# Patient Record
Sex: Female | Born: 1935 | Race: White | Hispanic: No | Marital: Single | Smoking: Former smoker
Health system: Southern US, Community
[De-identification: ages and names within clinical notes are randomized; demographics above are authoritative.]

## PROBLEM LIST (undated history)

## (undated) DIAGNOSIS — I10 Essential (primary) hypertension: Secondary | ICD-10-CM

## (undated) DIAGNOSIS — E785 Hyperlipidemia, unspecified: Secondary | ICD-10-CM

## (undated) DIAGNOSIS — I5189 Other ill-defined heart diseases: Secondary | ICD-10-CM

## (undated) DIAGNOSIS — D631 Anemia in chronic kidney disease: Secondary | ICD-10-CM

## (undated) DIAGNOSIS — J449 Chronic obstructive pulmonary disease, unspecified: Secondary | ICD-10-CM

## (undated) DIAGNOSIS — I351 Nonrheumatic aortic (valve) insufficiency: Secondary | ICD-10-CM

## (undated) DIAGNOSIS — I350 Nonrheumatic aortic (valve) stenosis: Secondary | ICD-10-CM

## (undated) DIAGNOSIS — E039 Hypothyroidism, unspecified: Secondary | ICD-10-CM

## (undated) DIAGNOSIS — N189 Chronic kidney disease, unspecified: Secondary | ICD-10-CM

## (undated) DIAGNOSIS — N184 Chronic kidney disease, stage 4 (severe): Secondary | ICD-10-CM

## (undated) DIAGNOSIS — F068 Other specified mental disorders due to known physiological condition: Secondary | ICD-10-CM

## (undated) HISTORY — PX: BREAST REDUCTION SURGERY: SHX8

## (undated) HISTORY — PX: CHOLECYSTECTOMY: SHX55

---

## 2018-03-03 DIAGNOSIS — I10 Essential (primary) hypertension: Secondary | ICD-10-CM | POA: Diagnosis not present

## 2018-03-03 DIAGNOSIS — E038 Other specified hypothyroidism: Secondary | ICD-10-CM | POA: Diagnosis not present

## 2018-04-17 DIAGNOSIS — Z79899 Other long term (current) drug therapy: Secondary | ICD-10-CM | POA: Diagnosis not present

## 2018-04-17 DIAGNOSIS — E038 Other specified hypothyroidism: Secondary | ICD-10-CM | POA: Diagnosis not present

## 2018-04-17 DIAGNOSIS — E782 Mixed hyperlipidemia: Secondary | ICD-10-CM | POA: Diagnosis not present

## 2018-04-24 DIAGNOSIS — R69 Illness, unspecified: Secondary | ICD-10-CM | POA: Diagnosis not present

## 2018-04-24 DIAGNOSIS — I10 Essential (primary) hypertension: Secondary | ICD-10-CM | POA: Diagnosis not present

## 2018-04-24 DIAGNOSIS — N184 Chronic kidney disease, stage 4 (severe): Secondary | ICD-10-CM | POA: Diagnosis not present

## 2018-04-24 DIAGNOSIS — G301 Alzheimer's disease with late onset: Secondary | ICD-10-CM | POA: Diagnosis not present

## 2018-04-24 DIAGNOSIS — E538 Deficiency of other specified B group vitamins: Secondary | ICD-10-CM | POA: Diagnosis not present

## 2018-04-24 DIAGNOSIS — E782 Mixed hyperlipidemia: Secondary | ICD-10-CM | POA: Diagnosis not present

## 2018-04-24 DIAGNOSIS — E038 Other specified hypothyroidism: Secondary | ICD-10-CM | POA: Diagnosis not present

## 2018-04-24 DIAGNOSIS — F3341 Major depressive disorder, recurrent, in partial remission: Secondary | ICD-10-CM | POA: Diagnosis not present

## 2018-04-24 DIAGNOSIS — M3509 Sicca syndrome with other organ involvement: Secondary | ICD-10-CM | POA: Diagnosis not present

## 2018-04-24 DIAGNOSIS — F0280 Dementia in other diseases classified elsewhere without behavioral disturbance: Secondary | ICD-10-CM | POA: Diagnosis not present

## 2018-07-25 DIAGNOSIS — E038 Other specified hypothyroidism: Secondary | ICD-10-CM | POA: Diagnosis not present

## 2018-10-06 DIAGNOSIS — R69 Illness, unspecified: Secondary | ICD-10-CM | POA: Diagnosis not present

## 2018-10-18 DIAGNOSIS — E038 Other specified hypothyroidism: Secondary | ICD-10-CM | POA: Diagnosis not present

## 2018-10-18 DIAGNOSIS — E782 Mixed hyperlipidemia: Secondary | ICD-10-CM | POA: Diagnosis not present

## 2018-10-18 DIAGNOSIS — E538 Deficiency of other specified B group vitamins: Secondary | ICD-10-CM | POA: Diagnosis not present

## 2018-10-18 DIAGNOSIS — I10 Essential (primary) hypertension: Secondary | ICD-10-CM | POA: Diagnosis not present

## 2018-10-23 DIAGNOSIS — G301 Alzheimer's disease with late onset: Secondary | ICD-10-CM | POA: Diagnosis not present

## 2018-10-23 DIAGNOSIS — E782 Mixed hyperlipidemia: Secondary | ICD-10-CM | POA: Diagnosis not present

## 2018-10-23 DIAGNOSIS — N184 Chronic kidney disease, stage 4 (severe): Secondary | ICD-10-CM | POA: Diagnosis not present

## 2018-10-23 DIAGNOSIS — F0280 Dementia in other diseases classified elsewhere without behavioral disturbance: Secondary | ICD-10-CM | POA: Diagnosis not present

## 2018-10-23 DIAGNOSIS — R69 Illness, unspecified: Secondary | ICD-10-CM | POA: Diagnosis not present

## 2018-10-23 DIAGNOSIS — I10 Essential (primary) hypertension: Secondary | ICD-10-CM | POA: Diagnosis not present

## 2018-10-23 DIAGNOSIS — Z0000 Encounter for general adult medical examination without abnormal findings: Secondary | ICD-10-CM | POA: Diagnosis not present

## 2018-10-23 DIAGNOSIS — F3341 Major depressive disorder, recurrent, in partial remission: Secondary | ICD-10-CM | POA: Diagnosis not present

## 2018-11-20 DIAGNOSIS — E039 Hypothyroidism, unspecified: Secondary | ICD-10-CM | POA: Diagnosis not present

## 2018-11-20 DIAGNOSIS — R0902 Hypoxemia: Secondary | ICD-10-CM | POA: Diagnosis not present

## 2018-11-20 DIAGNOSIS — R9431 Abnormal electrocardiogram [ECG] [EKG]: Secondary | ICD-10-CM | POA: Diagnosis not present

## 2018-11-20 DIAGNOSIS — Z7982 Long term (current) use of aspirin: Secondary | ICD-10-CM | POA: Diagnosis not present

## 2018-11-20 DIAGNOSIS — R69 Illness, unspecified: Secondary | ICD-10-CM | POA: Diagnosis not present

## 2018-11-20 DIAGNOSIS — R0789 Other chest pain: Secondary | ICD-10-CM | POA: Diagnosis not present

## 2018-11-20 DIAGNOSIS — G4700 Insomnia, unspecified: Secondary | ICD-10-CM | POA: Diagnosis not present

## 2018-11-20 DIAGNOSIS — J181 Lobar pneumonia, unspecified organism: Secondary | ICD-10-CM | POA: Diagnosis not present

## 2018-11-20 DIAGNOSIS — J449 Chronic obstructive pulmonary disease, unspecified: Secondary | ICD-10-CM | POA: Diagnosis not present

## 2018-11-20 DIAGNOSIS — R918 Other nonspecific abnormal finding of lung field: Secondary | ICD-10-CM | POA: Diagnosis not present

## 2018-11-20 DIAGNOSIS — J159 Unspecified bacterial pneumonia: Secondary | ICD-10-CM | POA: Diagnosis not present

## 2018-11-20 DIAGNOSIS — K219 Gastro-esophageal reflux disease without esophagitis: Secondary | ICD-10-CM | POA: Diagnosis not present

## 2018-11-20 DIAGNOSIS — R0689 Other abnormalities of breathing: Secondary | ICD-10-CM | POA: Diagnosis not present

## 2018-11-20 DIAGNOSIS — N184 Chronic kidney disease, stage 4 (severe): Secondary | ICD-10-CM | POA: Diagnosis not present

## 2018-11-20 DIAGNOSIS — D638 Anemia in other chronic diseases classified elsewhere: Secondary | ICD-10-CM | POA: Diagnosis not present

## 2018-11-20 DIAGNOSIS — M3500 Sicca syndrome, unspecified: Secondary | ICD-10-CM | POA: Diagnosis not present

## 2018-11-20 DIAGNOSIS — R072 Precordial pain: Secondary | ICD-10-CM | POA: Diagnosis not present

## 2018-11-20 DIAGNOSIS — I2510 Atherosclerotic heart disease of native coronary artery without angina pectoris: Secondary | ICD-10-CM | POA: Diagnosis not present

## 2018-11-20 DIAGNOSIS — I440 Atrioventricular block, first degree: Secondary | ICD-10-CM | POA: Diagnosis not present

## 2018-11-20 DIAGNOSIS — G301 Alzheimer's disease with late onset: Secondary | ICD-10-CM | POA: Diagnosis not present

## 2018-11-20 DIAGNOSIS — R52 Pain, unspecified: Secondary | ICD-10-CM | POA: Diagnosis not present

## 2018-11-20 DIAGNOSIS — R079 Chest pain, unspecified: Secondary | ICD-10-CM | POA: Diagnosis not present

## 2018-11-20 DIAGNOSIS — I129 Hypertensive chronic kidney disease with stage 1 through stage 4 chronic kidney disease, or unspecified chronic kidney disease: Secondary | ICD-10-CM | POA: Diagnosis not present

## 2018-11-20 DIAGNOSIS — J440 Chronic obstructive pulmonary disease with acute lower respiratory infection: Secondary | ICD-10-CM | POA: Diagnosis not present

## 2018-11-21 DIAGNOSIS — J159 Unspecified bacterial pneumonia: Secondary | ICD-10-CM | POA: Diagnosis not present

## 2018-11-21 DIAGNOSIS — N184 Chronic kidney disease, stage 4 (severe): Secondary | ICD-10-CM | POA: Diagnosis not present

## 2018-11-21 DIAGNOSIS — J449 Chronic obstructive pulmonary disease, unspecified: Secondary | ICD-10-CM | POA: Diagnosis not present

## 2018-11-21 DIAGNOSIS — R69 Illness, unspecified: Secondary | ICD-10-CM | POA: Diagnosis not present

## 2018-11-21 DIAGNOSIS — I2510 Atherosclerotic heart disease of native coronary artery without angina pectoris: Secondary | ICD-10-CM | POA: Diagnosis not present

## 2018-11-21 DIAGNOSIS — Z8673 Personal history of transient ischemic attack (TIA), and cerebral infarction without residual deficits: Secondary | ICD-10-CM | POA: Diagnosis not present

## 2018-11-21 DIAGNOSIS — E039 Hypothyroidism, unspecified: Secondary | ICD-10-CM | POA: Diagnosis not present

## 2018-11-23 DIAGNOSIS — E039 Hypothyroidism, unspecified: Secondary | ICD-10-CM | POA: Diagnosis not present

## 2018-11-23 DIAGNOSIS — I2510 Atherosclerotic heart disease of native coronary artery without angina pectoris: Secondary | ICD-10-CM | POA: Diagnosis not present

## 2018-11-23 DIAGNOSIS — R69 Illness, unspecified: Secondary | ICD-10-CM | POA: Diagnosis not present

## 2018-11-23 DIAGNOSIS — I129 Hypertensive chronic kidney disease with stage 1 through stage 4 chronic kidney disease, or unspecified chronic kidney disease: Secondary | ICD-10-CM | POA: Diagnosis not present

## 2018-11-23 DIAGNOSIS — J440 Chronic obstructive pulmonary disease with acute lower respiratory infection: Secondary | ICD-10-CM | POA: Diagnosis not present

## 2018-11-23 DIAGNOSIS — Z8673 Personal history of transient ischemic attack (TIA), and cerebral infarction without residual deficits: Secondary | ICD-10-CM | POA: Diagnosis not present

## 2018-11-23 DIAGNOSIS — J189 Pneumonia, unspecified organism: Secondary | ICD-10-CM | POA: Diagnosis not present

## 2018-11-23 DIAGNOSIS — E785 Hyperlipidemia, unspecified: Secondary | ICD-10-CM | POA: Diagnosis not present

## 2018-11-23 DIAGNOSIS — K219 Gastro-esophageal reflux disease without esophagitis: Secondary | ICD-10-CM | POA: Diagnosis not present

## 2018-11-23 DIAGNOSIS — N184 Chronic kidney disease, stage 4 (severe): Secondary | ICD-10-CM | POA: Diagnosis not present

## 2018-11-27 DIAGNOSIS — R69 Illness, unspecified: Secondary | ICD-10-CM | POA: Diagnosis not present

## 2018-12-05 DIAGNOSIS — I10 Essential (primary) hypertension: Secondary | ICD-10-CM | POA: Diagnosis not present

## 2018-12-05 DIAGNOSIS — R42 Dizziness and giddiness: Secondary | ICD-10-CM | POA: Diagnosis not present

## 2018-12-05 DIAGNOSIS — R918 Other nonspecific abnormal finding of lung field: Secondary | ICD-10-CM | POA: Diagnosis not present

## 2018-12-05 DIAGNOSIS — Z09 Encounter for follow-up examination after completed treatment for conditions other than malignant neoplasm: Secondary | ICD-10-CM | POA: Diagnosis not present

## 2018-12-05 DIAGNOSIS — J181 Lobar pneumonia, unspecified organism: Secondary | ICD-10-CM | POA: Diagnosis not present

## 2018-12-05 DIAGNOSIS — Z955 Presence of coronary angioplasty implant and graft: Secondary | ICD-10-CM | POA: Diagnosis not present

## 2019-01-04 DIAGNOSIS — R69 Illness, unspecified: Secondary | ICD-10-CM | POA: Diagnosis not present

## 2019-01-11 DIAGNOSIS — R918 Other nonspecific abnormal finding of lung field: Secondary | ICD-10-CM | POA: Diagnosis not present

## 2019-01-11 DIAGNOSIS — J181 Lobar pneumonia, unspecified organism: Secondary | ICD-10-CM | POA: Diagnosis not present

## 2019-01-11 DIAGNOSIS — K449 Diaphragmatic hernia without obstruction or gangrene: Secondary | ICD-10-CM | POA: Diagnosis not present

## 2019-02-15 DIAGNOSIS — I10 Essential (primary) hypertension: Secondary | ICD-10-CM | POA: Diagnosis not present

## 2019-02-15 DIAGNOSIS — E875 Hyperkalemia: Secondary | ICD-10-CM | POA: Diagnosis not present

## 2019-02-15 DIAGNOSIS — J101 Influenza due to other identified influenza virus with other respiratory manifestations: Secondary | ICD-10-CM | POA: Diagnosis not present

## 2019-02-22 DIAGNOSIS — J181 Lobar pneumonia, unspecified organism: Secondary | ICD-10-CM | POA: Diagnosis not present

## 2019-02-22 DIAGNOSIS — E782 Mixed hyperlipidemia: Secondary | ICD-10-CM | POA: Diagnosis not present

## 2019-02-22 DIAGNOSIS — I10 Essential (primary) hypertension: Secondary | ICD-10-CM | POA: Diagnosis not present

## 2019-02-22 DIAGNOSIS — R918 Other nonspecific abnormal finding of lung field: Secondary | ICD-10-CM | POA: Diagnosis not present

## 2019-02-22 DIAGNOSIS — R69 Illness, unspecified: Secondary | ICD-10-CM | POA: Diagnosis not present

## 2019-02-22 DIAGNOSIS — G301 Alzheimer's disease with late onset: Secondary | ICD-10-CM | POA: Diagnosis not present

## 2019-02-22 DIAGNOSIS — N184 Chronic kidney disease, stage 4 (severe): Secondary | ICD-10-CM | POA: Diagnosis not present

## 2019-02-22 DIAGNOSIS — E538 Deficiency of other specified B group vitamins: Secondary | ICD-10-CM | POA: Diagnosis not present

## 2019-02-22 DIAGNOSIS — F0280 Dementia in other diseases classified elsewhere without behavioral disturbance: Secondary | ICD-10-CM | POA: Diagnosis not present

## 2019-02-28 DIAGNOSIS — L821 Other seborrheic keratosis: Secondary | ICD-10-CM | POA: Diagnosis not present

## 2019-02-28 DIAGNOSIS — M40205 Unspecified kyphosis, thoracolumbar region: Secondary | ICD-10-CM | POA: Diagnosis not present

## 2019-02-28 DIAGNOSIS — Z85821 Personal history of Merkel cell carcinoma: Secondary | ICD-10-CM | POA: Diagnosis not present

## 2019-02-28 DIAGNOSIS — Z85828 Personal history of other malignant neoplasm of skin: Secondary | ICD-10-CM | POA: Diagnosis not present

## 2019-04-07 DIAGNOSIS — W19XXXA Unspecified fall, initial encounter: Secondary | ICD-10-CM | POA: Diagnosis not present

## 2019-04-07 DIAGNOSIS — Y998 Other external cause status: Secondary | ICD-10-CM | POA: Diagnosis not present

## 2019-04-07 DIAGNOSIS — M542 Cervicalgia: Secondary | ICD-10-CM | POA: Diagnosis not present

## 2019-04-07 DIAGNOSIS — S0990XA Unspecified injury of head, initial encounter: Secondary | ICD-10-CM | POA: Diagnosis not present

## 2019-04-07 DIAGNOSIS — G44309 Post-traumatic headache, unspecified, not intractable: Secondary | ICD-10-CM | POA: Diagnosis not present

## 2019-04-07 DIAGNOSIS — S0001XA Abrasion of scalp, initial encounter: Secondary | ICD-10-CM | POA: Diagnosis not present

## 2019-04-07 DIAGNOSIS — M25562 Pain in left knee: Secondary | ICD-10-CM | POA: Diagnosis not present

## 2019-04-07 DIAGNOSIS — W01198A Fall on same level from slipping, tripping and stumbling with subsequent striking against other object, initial encounter: Secondary | ICD-10-CM | POA: Diagnosis not present

## 2019-06-27 DIAGNOSIS — R109 Unspecified abdominal pain: Secondary | ICD-10-CM | POA: Diagnosis not present

## 2019-06-27 DIAGNOSIS — K5901 Slow transit constipation: Secondary | ICD-10-CM | POA: Diagnosis not present

## 2019-06-27 DIAGNOSIS — I10 Essential (primary) hypertension: Secondary | ICD-10-CM | POA: Diagnosis not present

## 2019-08-09 DIAGNOSIS — N184 Chronic kidney disease, stage 4 (severe): Secondary | ICD-10-CM | POA: Diagnosis not present

## 2019-08-09 DIAGNOSIS — I129 Hypertensive chronic kidney disease with stage 1 through stage 4 chronic kidney disease, or unspecified chronic kidney disease: Secondary | ICD-10-CM | POA: Diagnosis not present

## 2019-08-09 DIAGNOSIS — E039 Hypothyroidism, unspecified: Secondary | ICD-10-CM | POA: Diagnosis not present

## 2019-08-13 DIAGNOSIS — E039 Hypothyroidism, unspecified: Secondary | ICD-10-CM | POA: Diagnosis not present

## 2019-08-13 DIAGNOSIS — Z79899 Other long term (current) drug therapy: Secondary | ICD-10-CM | POA: Diagnosis not present

## 2019-08-13 DIAGNOSIS — D649 Anemia, unspecified: Secondary | ICD-10-CM | POA: Diagnosis not present

## 2019-08-20 ENCOUNTER — Other Ambulatory Visit: Payer: Self-pay

## 2019-08-20 ENCOUNTER — Inpatient Hospital Stay (HOSPITAL_COMMUNITY)
Admission: EM | Admit: 2019-08-20 | Discharge: 2019-08-24 | DRG: 871 | Disposition: A | Discharge status: Home Health | Payer: Medicare HMO | Attending: Internal Medicine | Admitting: Internal Medicine

## 2019-08-20 ENCOUNTER — Emergency Department (HOSPITAL_COMMUNITY): Payer: Medicare HMO

## 2019-08-20 ENCOUNTER — Encounter (HOSPITAL_COMMUNITY): Payer: Self-pay

## 2019-08-20 DIAGNOSIS — D539 Nutritional anemia, unspecified: Secondary | ICD-10-CM

## 2019-08-20 DIAGNOSIS — E782 Mixed hyperlipidemia: Secondary | ICD-10-CM | POA: Diagnosis present

## 2019-08-20 DIAGNOSIS — F0390 Unspecified dementia without behavioral disturbance: Secondary | ICD-10-CM | POA: Diagnosis present

## 2019-08-20 DIAGNOSIS — D520 Dietary folate deficiency anemia: Secondary | ICD-10-CM | POA: Diagnosis present

## 2019-08-20 DIAGNOSIS — J9621 Acute and chronic respiratory failure with hypoxia: Secondary | ICD-10-CM | POA: Diagnosis not present

## 2019-08-20 DIAGNOSIS — J9601 Acute respiratory failure with hypoxia: Secondary | ICD-10-CM | POA: Diagnosis not present

## 2019-08-20 DIAGNOSIS — N184 Chronic kidney disease, stage 4 (severe): Secondary | ICD-10-CM | POA: Diagnosis not present

## 2019-08-20 DIAGNOSIS — J441 Chronic obstructive pulmonary disease with (acute) exacerbation: Secondary | ICD-10-CM | POA: Diagnosis not present

## 2019-08-20 DIAGNOSIS — R112 Nausea with vomiting, unspecified: Secondary | ICD-10-CM | POA: Diagnosis not present

## 2019-08-20 DIAGNOSIS — G939 Disorder of brain, unspecified: Secondary | ICD-10-CM | POA: Diagnosis present

## 2019-08-20 DIAGNOSIS — E8770 Fluid overload, unspecified: Secondary | ICD-10-CM

## 2019-08-20 DIAGNOSIS — Z85821 Personal history of Merkel cell carcinoma: Secondary | ICD-10-CM

## 2019-08-20 DIAGNOSIS — I10 Essential (primary) hypertension: Secondary | ICD-10-CM | POA: Diagnosis present

## 2019-08-20 DIAGNOSIS — Z20828 Contact with and (suspected) exposure to other viral communicable diseases: Secondary | ICD-10-CM | POA: Diagnosis present

## 2019-08-20 DIAGNOSIS — R9431 Abnormal electrocardiogram [ECG] [EKG]: Secondary | ICD-10-CM | POA: Diagnosis present

## 2019-08-20 DIAGNOSIS — D631 Anemia in chronic kidney disease: Secondary | ICD-10-CM | POA: Diagnosis present

## 2019-08-20 DIAGNOSIS — R05 Cough: Secondary | ICD-10-CM | POA: Diagnosis not present

## 2019-08-20 DIAGNOSIS — E039 Hypothyroidism, unspecified: Secondary | ICD-10-CM | POA: Diagnosis present

## 2019-08-20 DIAGNOSIS — F068 Other specified mental disorders due to known physiological condition: Secondary | ICD-10-CM | POA: Diagnosis not present

## 2019-08-20 DIAGNOSIS — J189 Pneumonia, unspecified organism: Secondary | ICD-10-CM | POA: Diagnosis present

## 2019-08-20 DIAGNOSIS — Z823 Family history of stroke: Secondary | ICD-10-CM | POA: Diagnosis not present

## 2019-08-20 DIAGNOSIS — I129 Hypertensive chronic kidney disease with stage 1 through stage 4 chronic kidney disease, or unspecified chronic kidney disease: Secondary | ICD-10-CM | POA: Diagnosis present

## 2019-08-20 DIAGNOSIS — R0602 Shortness of breath: Secondary | ICD-10-CM | POA: Diagnosis not present

## 2019-08-20 DIAGNOSIS — R52 Pain, unspecified: Secondary | ICD-10-CM | POA: Diagnosis not present

## 2019-08-20 DIAGNOSIS — R079 Chest pain, unspecified: Secondary | ICD-10-CM

## 2019-08-20 DIAGNOSIS — K219 Gastro-esophageal reflux disease without esophagitis: Secondary | ICD-10-CM | POA: Diagnosis present

## 2019-08-20 DIAGNOSIS — J440 Chronic obstructive pulmonary disease with acute lower respiratory infection: Secondary | ICD-10-CM | POA: Diagnosis present

## 2019-08-20 DIAGNOSIS — J181 Lobar pneumonia, unspecified organism: Secondary | ICD-10-CM | POA: Diagnosis not present

## 2019-08-20 DIAGNOSIS — Z87891 Personal history of nicotine dependence: Secondary | ICD-10-CM | POA: Diagnosis not present

## 2019-08-20 DIAGNOSIS — Z66 Do not resuscitate: Secondary | ICD-10-CM | POA: Diagnosis not present

## 2019-08-20 DIAGNOSIS — A419 Sepsis, unspecified organism: Secondary | ICD-10-CM | POA: Diagnosis not present

## 2019-08-20 DIAGNOSIS — R69 Illness, unspecified: Secondary | ICD-10-CM | POA: Diagnosis not present

## 2019-08-20 DIAGNOSIS — R0789 Other chest pain: Secondary | ICD-10-CM | POA: Diagnosis not present

## 2019-08-20 DIAGNOSIS — Z888 Allergy status to other drugs, medicaments and biological substances status: Secondary | ICD-10-CM | POA: Diagnosis not present

## 2019-08-20 DIAGNOSIS — I340 Nonrheumatic mitral (valve) insufficiency: Secondary | ICD-10-CM | POA: Diagnosis not present

## 2019-08-20 HISTORY — DX: Other specified mental disorders due to known physiological condition: F06.8

## 2019-08-20 HISTORY — DX: Hypothyroidism, unspecified: E03.9

## 2019-08-20 HISTORY — DX: Essential (primary) hypertension: I10

## 2019-08-20 HISTORY — DX: Hyperlipidemia, unspecified: E78.5

## 2019-08-20 HISTORY — DX: Chronic kidney disease, stage 4 (severe): N18.4

## 2019-08-20 LAB — CBC WITH DIFFERENTIAL/PLATELET
Abs Immature Granulocytes: 0.05 10*3/uL (ref 0.00–0.07)
Basophils Absolute: 0 10*3/uL (ref 0.0–0.1)
Basophils Relative: 0 %
Eosinophils Absolute: 0 10*3/uL (ref 0.0–0.5)
Eosinophils Relative: 0 %
HCT: 35.8 % — ABNORMAL LOW (ref 36.0–46.0)
Hemoglobin: 11.2 g/dL — ABNORMAL LOW (ref 12.0–15.0)
Immature Granulocytes: 0 %
Lymphocytes Relative: 5 %
Lymphs Abs: 0.6 10*3/uL — ABNORMAL LOW (ref 0.7–4.0)
MCH: 31.4 pg (ref 26.0–34.0)
MCHC: 31.3 g/dL (ref 30.0–36.0)
MCV: 100.3 fL — ABNORMAL HIGH (ref 80.0–100.0)
Monocytes Absolute: 1.5 10*3/uL — ABNORMAL HIGH (ref 0.1–1.0)
Monocytes Relative: 12 %
Neutro Abs: 10.8 10*3/uL — ABNORMAL HIGH (ref 1.7–7.7)
Neutrophils Relative %: 83 %
Platelets: 213 10*3/uL (ref 150–400)
RBC: 3.57 MIL/uL — ABNORMAL LOW (ref 3.87–5.11)
RDW: 13.7 % (ref 11.5–15.5)
WBC: 12.9 10*3/uL — ABNORMAL HIGH (ref 4.0–10.5)
nRBC: 0 % (ref 0.0–0.2)

## 2019-08-20 LAB — COMPREHENSIVE METABOLIC PANEL
ALT: 40 U/L (ref 0–44)
AST: 63 U/L — ABNORMAL HIGH (ref 15–41)
Albumin: 3.3 g/dL — ABNORMAL LOW (ref 3.5–5.0)
Alkaline Phosphatase: 186 U/L — ABNORMAL HIGH (ref 38–126)
Anion gap: 12 (ref 5–15)
BUN: 30 mg/dL — ABNORMAL HIGH (ref 8–23)
CO2: 19 mmol/L — ABNORMAL LOW (ref 22–32)
Calcium: 8.8 mg/dL — ABNORMAL LOW (ref 8.9–10.3)
Chloride: 108 mmol/L (ref 98–111)
Creatinine, Ser: 1.85 mg/dL — ABNORMAL HIGH (ref 0.44–1.00)
GFR calc Af Amer: 27 mL/min — ABNORMAL LOW (ref >60)
GFR calc non Af Amer: 23 mL/min — ABNORMAL LOW (ref >60)
Glucose, Bld: 118 mg/dL — ABNORMAL HIGH (ref 70–99)
Potassium: 4.3 mmol/L (ref 3.5–5.1)
Sodium: 139 mmol/L (ref 135–145)
Total Bilirubin: 0.8 mg/dL (ref 0.3–1.2)
Total Protein: 7.4 g/dL (ref 6.5–8.1)

## 2019-08-20 LAB — LACTIC ACID, PLASMA
Lactic Acid, Venous: 2 mmol/L (ref 0.5–1.9)
Lactic Acid, Venous: 2.7 mmol/L (ref 0.5–1.9)

## 2019-08-20 LAB — SARS CORONAVIRUS 2 BY RT PCR (HOSPITAL ORDER, PERFORMED IN ~~LOC~~ HOSPITAL LAB): SARS Coronavirus 2: NEGATIVE

## 2019-08-20 LAB — TROPONIN I (HIGH SENSITIVITY)
Troponin I (High Sensitivity): 11 ng/L (ref <18)
Troponin I (High Sensitivity): 11 ng/L (ref <18)

## 2019-08-20 LAB — MRSA PCR SCREENING: MRSA by PCR: NEGATIVE

## 2019-08-20 LAB — STREP PNEUMONIAE URINARY ANTIGEN: Strep Pneumo Urinary Antigen: NEGATIVE

## 2019-08-20 LAB — BRAIN NATRIURETIC PEPTIDE: B Natriuretic Peptide: 210.5 pg/mL — ABNORMAL HIGH (ref 0.0–100.0)

## 2019-08-20 MED ORDER — BUDESONIDE 0.5 MG/2ML IN SUSP
0.5000 mg | Freq: Two times a day (BID) | RESPIRATORY_TRACT | Status: DC
  Administered 2019-08-20 – 2019-08-24 (×9): 0.5 mg via RESPIRATORY_TRACT
  Filled 2019-08-20 (×10): qty 2

## 2019-08-20 MED ORDER — ONDANSETRON HCL 4 MG PO TABS: 4.0000 mg | ORAL_TABLET | Freq: Four times a day (QID) | ORAL | Status: DC | PRN

## 2019-08-20 MED ORDER — ALBUTEROL SULFATE HFA 108 (90 BASE) MCG/ACT IN AERS
6.0000 | INHALATION_SPRAY | Freq: Once | RESPIRATORY_TRACT | Status: AC
  Administered 2019-08-20: 6 via RESPIRATORY_TRACT
  Filled 2019-08-20: qty 6.7

## 2019-08-20 MED ORDER — ACETAMINOPHEN 325 MG PO TABS
650.0000 mg | ORAL_TABLET | Freq: Four times a day (QID) | ORAL | Status: DC | PRN
  Administered 2019-08-20 – 2019-08-21 (×3): 650 mg via ORAL
  Filled 2019-08-20 (×3): qty 2

## 2019-08-20 MED ORDER — PANTOPRAZOLE SODIUM 40 MG PO TBEC
40.0000 mg | DELAYED_RELEASE_TABLET | Freq: Every day | ORAL | Status: DC
  Administered 2019-08-20 – 2019-08-24 (×5): 40 mg via ORAL
  Filled 2019-08-20 (×5): qty 1

## 2019-08-20 MED ORDER — ONDANSETRON HCL 4 MG/2ML IJ SOLN: 4.0000 mg | Freq: Four times a day (QID) | INTRAMUSCULAR | Status: DC | PRN

## 2019-08-20 MED ORDER — MAGNESIUM SULFATE 2 GM/50ML IV SOLN
2.0000 g | Freq: Once | INTRAVENOUS | Status: AC
  Administered 2019-08-20: 2 g via INTRAVENOUS
  Filled 2019-08-20: qty 50

## 2019-08-20 MED ORDER — ASPIRIN EC 81 MG PO TBEC
81.0000 mg | DELAYED_RELEASE_TABLET | Freq: Every day | ORAL | Status: DC
  Administered 2019-08-20 – 2019-08-24 (×5): 81 mg via ORAL
  Filled 2019-08-20 (×6): qty 1

## 2019-08-20 MED ORDER — VANCOMYCIN HCL IN DEXTROSE 1-5 GM/200ML-% IV SOLN
1000.0000 mg | Freq: Once | INTRAVENOUS | Status: AC
  Administered 2019-08-20: 13:00:00 1000 mg via INTRAVENOUS
  Filled 2019-08-20 (×2): qty 200

## 2019-08-20 MED ORDER — SODIUM CHLORIDE 0.9 % IV SOLN
500.0000 mg | INTRAVENOUS | Status: DC
  Administered 2019-08-20 – 2019-08-24 (×5): 500 mg via INTRAVENOUS
  Filled 2019-08-20 (×6): qty 500

## 2019-08-20 MED ORDER — GUAIFENESIN ER 600 MG PO TB12
600.0000 mg | ORAL_TABLET | Freq: Two times a day (BID) | ORAL | Status: DC
  Administered 2019-08-20 – 2019-08-24 (×9): 600 mg via ORAL
  Filled 2019-08-20 (×9): qty 1

## 2019-08-20 MED ORDER — SODIUM BICARBONATE 650 MG PO TABS
650.0000 mg | ORAL_TABLET | Freq: Two times a day (BID) | ORAL | Status: DC
  Administered 2019-08-20 – 2019-08-21 (×4): 650 mg via ORAL
  Filled 2019-08-20 (×5): qty 1

## 2019-08-20 MED ORDER — VANCOMYCIN HCL IN DEXTROSE 750-5 MG/150ML-% IV SOLN: 750.0000 mg | INTRAVENOUS | Status: DC

## 2019-08-20 MED ORDER — ALBUTEROL SULFATE (2.5 MG/3ML) 0.083% IN NEBU
2.5000 mg | INHALATION_SOLUTION | RESPIRATORY_TRACT | Status: DC | PRN
  Administered 2019-08-21 – 2019-08-24 (×4): 2.5 mg via RESPIRATORY_TRACT
  Filled 2019-08-20 (×5): qty 3

## 2019-08-20 MED ORDER — SODIUM CHLORIDE 0.9 % IV SOLN
2.0000 g | Freq: Once | INTRAVENOUS | Status: AC
  Administered 2019-08-20: 2 g via INTRAVENOUS
  Filled 2019-08-20: qty 2

## 2019-08-20 MED ORDER — SODIUM CHLORIDE 0.9% FLUSH
3.0000 mL | Freq: Two times a day (BID) | INTRAVENOUS | Status: DC
  Administered 2019-08-20 – 2019-08-23 (×5): 3 mL via INTRAVENOUS

## 2019-08-20 MED ORDER — METOPROLOL TARTRATE 25 MG PO TABS
25.0000 mg | ORAL_TABLET | Freq: Two times a day (BID) | ORAL | Status: DC
  Administered 2019-08-20 – 2019-08-24 (×9): 25 mg via ORAL
  Filled 2019-08-20 (×9): qty 1

## 2019-08-20 MED ORDER — METHYLPREDNISOLONE SODIUM SUCC 125 MG IJ SOLR
80.0000 mg | Freq: Once | INTRAMUSCULAR | Status: AC
  Administered 2019-08-20: 06:00:00 80 mg via INTRAVENOUS
  Filled 2019-08-20: qty 2

## 2019-08-20 MED ORDER — SODIUM CHLORIDE 0.9 % IV BOLUS
1000.0000 mL | Freq: Once | INTRAVENOUS | Status: AC
  Administered 2019-08-20: 07:00:00 1000 mL via INTRAVENOUS

## 2019-08-20 MED ORDER — ALBUTEROL SULFATE (2.5 MG/3ML) 0.083% IN NEBU
2.5000 mg | INHALATION_SOLUTION | Freq: Two times a day (BID) | RESPIRATORY_TRACT | Status: DC
  Administered 2019-08-20 – 2019-08-23 (×8): 2.5 mg via RESPIRATORY_TRACT
  Filled 2019-08-20 (×8): qty 3

## 2019-08-20 MED ORDER — MEMANTINE HCL 5 MG PO TABS
5.0000 mg | ORAL_TABLET | Freq: Every day | ORAL | Status: DC
  Administered 2019-08-20 – 2019-08-23 (×4): 5 mg via ORAL
  Filled 2019-08-20 (×5): qty 1

## 2019-08-20 MED ORDER — ATORVASTATIN CALCIUM 10 MG PO TABS
10.0000 mg | ORAL_TABLET | Freq: Every day | ORAL | Status: DC
  Administered 2019-08-20 – 2019-08-23 (×4): 10 mg via ORAL
  Filled 2019-08-20 (×4): qty 1

## 2019-08-20 MED ORDER — ARFORMOTEROL TARTRATE 15 MCG/2ML IN NEBU
15.0000 ug | INHALATION_SOLUTION | Freq: Two times a day (BID) | RESPIRATORY_TRACT | Status: DC
  Administered 2019-08-20 – 2019-08-24 (×8): 15 ug via RESPIRATORY_TRACT
  Filled 2019-08-20 (×10): qty 2

## 2019-08-20 MED ORDER — ALBUTEROL SULFATE (2.5 MG/3ML) 0.083% IN NEBU: 2.5000 mg | INHALATION_SOLUTION | Freq: Four times a day (QID) | RESPIRATORY_TRACT | Status: DC | PRN

## 2019-08-20 MED ORDER — ENSURE ENLIVE PO LIQD
237.0000 mL | Freq: Two times a day (BID) | ORAL | Status: DC
  Administered 2019-08-21 – 2019-08-24 (×6): 237 mL via ORAL

## 2019-08-20 MED ORDER — ACETAMINOPHEN 650 MG RE SUPP: 650.0000 mg | Freq: Four times a day (QID) | RECTAL | Status: DC | PRN

## 2019-08-20 MED ORDER — PRAMIPEXOLE DIHYDROCHLORIDE 0.25 MG PO TABS
0.2500 mg | ORAL_TABLET | Freq: Every day | ORAL | Status: DC
  Administered 2019-08-20 – 2019-08-23 (×3): 0.25 mg via ORAL
  Filled 2019-08-20 (×5): qty 1

## 2019-08-20 MED ORDER — VITAMIN B-12 1000 MCG PO TABS
1000.0000 ug | ORAL_TABLET | Freq: Every day | ORAL | Status: DC
  Administered 2019-08-20 – 2019-08-24 (×5): 1000 ug via ORAL
  Filled 2019-08-20 (×5): qty 1

## 2019-08-20 MED ORDER — LEVOTHYROXINE SODIUM 75 MCG PO TABS
75.0000 ug | ORAL_TABLET | Freq: Every day | ORAL | Status: DC
  Administered 2019-08-21 – 2019-08-24 (×4): 75 ug via ORAL
  Filled 2019-08-20 (×5): qty 1

## 2019-08-20 MED ORDER — SODIUM CHLORIDE 0.9 % IV SOLN: 2.0000 g | INTRAVENOUS | Status: DC

## 2019-08-20 MED ORDER — PREDNISONE 20 MG PO TABS
40.0000 mg | ORAL_TABLET | Freq: Every day | ORAL | Status: DC
  Administered 2019-08-21: 40 mg via ORAL
  Filled 2019-08-20 (×2): qty 2

## 2019-08-20 MED ORDER — AMLODIPINE BESYLATE 5 MG PO TABS
5.0000 mg | ORAL_TABLET | Freq: Every day | ORAL | Status: DC
  Administered 2019-08-20 – 2019-08-23 (×4): 5 mg via ORAL
  Filled 2019-08-20 (×4): qty 1

## 2019-08-20 MED ORDER — ENOXAPARIN SODIUM 30 MG/0.3ML ~~LOC~~ SOLN
30.0000 mg | Freq: Every day | SUBCUTANEOUS | Status: DC
  Administered 2019-08-20 – 2019-08-24 (×5): 30 mg via SUBCUTANEOUS
  Filled 2019-08-20 (×6): qty 0.3

## 2019-08-20 MED ORDER — MONTELUKAST SODIUM 10 MG PO TABS
10.0000 mg | ORAL_TABLET | Freq: Every day | ORAL | Status: DC
  Administered 2019-08-20 – 2019-08-24 (×5): 10 mg via ORAL
  Filled 2019-08-20 (×5): qty 1

## 2019-08-20 MED ORDER — SODIUM CHLORIDE 0.9 % IV BOLUS
1000.0000 mL | Freq: Once | INTRAVENOUS | Status: AC
  Administered 2019-08-20: 09:00:00 1000 mL via INTRAVENOUS

## 2019-08-20 MED ORDER — ACETAMINOPHEN 325 MG PO TABS
650.0000 mg | ORAL_TABLET | Freq: Once | ORAL | Status: AC
  Administered 2019-08-20: 650 mg via ORAL
  Filled 2019-08-20: qty 2

## 2019-08-20 NOTE — H&P (Addendum)
History and Physical    Lisa Cortez [id number] DOB: 10/19/1925 DOA: 08/20/2019  Referring MD/NP/PA: Gean Birchwood, MD PCP: Patient, No Pcp Per  Patient coming from: [facility] via EMS  Chief Complaint: Cough and chest discomfort.  I have personally briefly reviewed patient's old medical records in [place]   HPI: Lisa Cortez is a 85 y.o. female with medical history significant of hypertension, hyperlipidemia, COPD, B12 deficiency, Merkel cell carcinoma, chronic kidney disease stage IV, mild dementia, and hypothyroidism; who presents with complaints of a cough and chest discomfort.  History is obtained from the patient and with talks with her daughter over the phone.  At baseline patient lives alone in independent living at [facility] and able to ambulate with use of a walker.  She has mild decrease in recent memories.  Patient reports that she has had a intermittent productive cough over the last few days.  She complains of left sided chest soreness.  Daughter who talks with her on the phone daily, and visits once weekly with social distancing has not noticed any symptoms.  Patient reports associated symptoms of some shortness of breath, wheezing, chills, malaise, nausea, and couple episodes of vomiting last night.  Hospitalized last in December 2019 for left-sided pneumonia.  Denies any recent sick contacts and notes that they have been on lockdown since COVID-19 started and have had no significant outbreaks at their facility to her knowledge.  Denies abdominal pain, headache, dysuria, falls, or myalgias.  ED Course: Upon admission into the emergency department patient was noted to be febrile up to 101.3 F, pulse 93-1 03, respirations 20-27, blood pressures maintained, and O2 saturations as low as 86% on room air with improvement to 100% on 4 L.  Labs significant for WBC 12.9, hemoglobin 11.2, BUN 30, creatinine 1.85, BNP 210.5, high-sensitivity troponin negative x2, and lactic acid  2.  COVID-19 screening negative.  Chest x-ray revealed hazy and patchy opacity of the left lung base with signs of cardiomegaly without overt failure.  Patient was given 80 mg of Solu-Medrol, 1 L normal saline IV fluids, 1 g of magnesium sulfate, albuterol inhaler, Tylenol, and cefepime for healthcare associated pneumonia.  Review of systems: Otherwise a complete 10 point review of systems was performed and negative except for as noted above.  Past Medical History:  Diagnosis Date  . Chronic kidney disease (CKD), stage IV (severe) ([place])   . Hyperlipidemia   . Hypertension   . Hypothyroidism   . 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 BREAST REDUCTION SURGERY    . CHOLECYSTECTOMY       reports that she has quit smoking. She has never used smokeless tobacco. She reports current alcohol use. She reports that she does not use drugs.  Not on File  Family History  Problem Relation Age of Onset  . CVA Mother   . Bone cancer Brother   . Dementia Brother     Prior to Admission medications   Not on File    Physical Exam:  Constitutional: Elderly female who appears to be in some respiratory discomfort Vitals:   08/20/19 0730 08/20/19 0745 08/20/19 0800 08/20/19 0815  BP: (!) 121/55 122/62 115/67 (!) 126/109  Pulse: 88 89 89 88  Resp: 20 (!) 21 (!) 23 19  Temp:      TempSrc:      SpO2: 99% 99% 99% 99%  Weight:    61.2 kg  Height:    4\' 11"  (1.499 m)   Eyes: PERRL, lids and conjunctivae normal ENMT: Mucous membranes are moist. Posterior pharynx clear of any exudate or lesions.  Neck: normal, supple, no masses, no thyromegaly Respiratory: Tachypneic with mild expiratory wheeze.  Patient currently on 4 L nasal cannula oxygen with O2 saturations maintained.  Able to talk in relatively complete sentences. Cardiovascular: Regular rate and rhythm, no murmurs / rubs / gallops. No extremity edema. 2+ pedal pulses. No  carotid bruits.  Abdomen: no tenderness, no masses palpated. No hepatosplenomegaly. Bowel sounds positive.  Musculoskeletal: no clubbing / cyanosis. No joint deformity upper and lower extremities. Good ROM, no contractures. Normal muscle tone.  Skin: no rashes, lesions, ulcers. No induration Neurologic: CN 2-12 grossly intact. Sensation intact, DTR normal. Strength 5/5 in all 4.  Psychiatric: Normal judgment and insight. Alert and oriented x 3. Normal mood.     Labs on Admission: I have personally reviewed following labs and imaging studies  CBC: Recent Labs  Lab 08/20/19 0524  WBC 12.9*  NEUTROABS 10.8*  HGB 11.2*  HCT 35.8*  MCV 100.3*  PLT 123456   Basic Metabolic Panel: Recent Labs  Lab 08/20/19 0524  NA 139  K 4.3  CL 108  CO2 19*  GLUCOSE 118*  BUN 30*  CREATININE 1.85*  CALCIUM 8.8*   GFR: Estimated Creatinine Clearance: 15.4 mL/min (A) (by C-G formula based on SCr of 1.85 mg/dL (H)). Liver Function Tests: Recent Labs  Lab 08/20/19 0524  AST 63*  ALT 40  ALKPHOS 186*  BILITOT 0.8  PROT 7.4  ALBUMIN 3.3*   No results for input(s): LIPASE, AMYLASE in the last 168 hours. No results for input(s): AMMONIA in the last 168 hours. Coagulation Profile: No results for input(s): INR, PROTIME in the last 168 hours. Cardiac Enzymes: No results for input(s): CKTOTAL, CKMB, CKMBINDEX, TROPONINI in the last 168 hours. BNP (last 3 results) No results for input(s): PROBNP in the last 8760 hours. HbA1C: No results for input(s): HGBA1C in the last 72 hours. CBG: No results for input(s): GLUCAP in the last 168 hours. Lipid Profile: No results for input(s): CHOL, HDL, LDLCALC, TRIG, CHOLHDL, LDLDIRECT in the last 72 hours. Thyroid Function Tests: No results for input(s): TSH, T4TOTAL, FREET4, T3FREE, THYROIDAB in the last 72 hours. Anemia Panel: No results for input(s): VITAMINB12, FOLATE, FERRITIN, TIBC, IRON, RETICCTPCT in the last 72 hours. Urine analysis: No  results found for: COLORURINE, APPEARANCEUR, LABSPEC, [place], GLUCOSEU, HGBUR, BILIRUBINUR, [place], PROTEINUR, UROBILINOGEN, NITRITE, LEUKOCYTESUR Sepsis Labs: Recent Results (from the past 240 hour(s))  Blood culture (routine x 2)     Status: None (Preliminary result)   Collection Time: 08/20/19  4:43 AM   Specimen: BLOOD RIGHT HAND  Result Value Ref Range Status   Specimen Description BLOOD RIGHT HAND  Final   Special Requests   Final    BOTTLES DRAWN AEROBIC ONLY Blood Culture results may not be optimal due to an inadequate volume of blood received in culture bottles   Culture   Final    NO GROWTH < 12 HOURS Performed at [facility], [place] [address], [place], [place] [address]    Report Status PENDING  Incomplete  Blood culture (routine x 2)     Status: None (Preliminary result)   Collection Time: 08/20/19  5:30 AM   Specimen: BLOOD  Result Value Ref Range Status   Specimen Description BLOOD RIGHT THUMB  Final   Special Requests   Final  BOTTLES DRAWN AEROBIC ONLY Blood Culture results may not be optimal due to an inadequate volume of blood received in culture bottles   Culture   Final    NO GROWTH < 12 HOURS Performed at [facility] [address], [place], [place] [address]    Report Status PENDING  Incomplete  SARS Coronavirus 2 [facility] order, Performed in [facility] hospital lab) Nasopharyngeal Nasopharyngeal Swab     Status: None   Collection Time: 08/20/19  6:05 AM   Specimen: Nasopharyngeal Swab  Result Value Ref Range Status   SARS Coronavirus 2 NEGATIVE NEGATIVE Final    Comment: (NOTE) If result is NEGATIVE SARS-CoV-2 target nucleic acids are NOT DETECTED. The SARS-CoV-2 RNA is generally detectable in upper and lower  respiratory specimens during the acute phase of infection. The lowest  concentration of SARS-CoV-2 viral copies this assay can detect is 250  copies / mL. A negative result does not preclude SARS-CoV-2 infection  and should not  be used as the sole basis for treatment or other  patient management decisions.  A negative result may occur with  improper specimen collection / handling, submission of specimen other  than nasopharyngeal swab, presence of viral mutation(s) within the  areas targeted by this assay, and inadequate number of viral copies  (<250 copies / mL). A negative result must be combined with clinical  observations, patient history, and epidemiological information. If result is POSITIVE SARS-CoV-2 target nucleic acids are DETECTED. The SARS-CoV-2 RNA is generally detectable in upper and lower  respiratory specimens dur ing the acute phase of infection.  Positive  results are indicative of active infection with SARS-CoV-2.  Clinical  correlation with patient history and other diagnostic information is  necessary to determine patient infection status.  Positive results do  not rule out bacterial infection or co-infection with other viruses. If result is PRESUMPTIVE POSTIVE SARS-CoV-2 nucleic acids MAY BE PRESENT.   A presumptive positive result was obtained on the submitted specimen  and confirmed on repeat testing.  While 2019 novel coronavirus  (SARS-CoV-2) nucleic acids may be present in the submitted sample  additional confirmatory testing may be necessary for epidemiological  and / or clinical management purposes  to differentiate between  SARS-CoV-2 and other Sarbecovirus currently known to infect humans.  If clinically indicated additional testing with an alternate test  methodology [phone]) is advised. The SARS-CoV-2 RNA is generally  detectable in upper and lower respiratory sp ecimens during the acute  phase of infection. The expected result is Negative. Fact Sheet for Patients:  StrictlyIdeas.no Fact Sheet for Healthcare Providers: BankingDealers.co.za This test is not yet approved or cleared by the [place] FDA and has been authorized  for detection and/or diagnosis of SARS-CoV-2 by FDA under an Emergency Use Authorization (EUA).  This EUA will remain in effect (meaning this test can be used) for the duration of the COVID-19 declaration under Section 564(b)(1) of the Act, 21 U.S.C. section 360bbb-3(b)(1), unless the authorization is terminated or revoked sooner. Performed at [facility], [place] [address] [place],  [address]      Radiological Exams on Admission: Dg Chest Portable 1 View  Result Date: 08/20/2019 CLINICAL DATA:  Initial evaluation for acute chest pain, cough, chills. EXAM: PORTABLE CHEST 1 VIEW COMPARISON:  None available. FINDINGS: Cardiomegaly. Mediastinal silhouette within normal limits. Aortic atherosclerosis. Lungs mildly hypoinflated. Hazy and patchy opacity at the left lung base, which could reflect atelectasis and/or infiltrate. Mild scattered right basilar subsegmental  atelectasis. Perihilar vascular congestion without pulmonary edema. No visible pleural effusion. No pneumothorax. No acute osseous finding. IMPRESSION: 1. Hazy and patchy opacity at the left lung base, which could reflect atelectasis and/or infiltrate. 2. Cardiomegaly with perihilar vascular congestion without overt pulmonary edema. 3. Aortic atherosclerosis. Electronically Signed   By: Jeannine Boga M.D.   On: 08/20/2019 04:45    EKG: Independently reviewed.  Sinus rhythm at 98 bpm with first-degree heart block and lateral ST depressions noted.  Assessment/Plan Sepsis secondary to pneumonia: Acute.  Patient presents with cough and shortness of breath. Found to be initially febrile up to 101.3 F, tachycardic, and tachypneic.  Chest x-ray showing left lower lobe sided hazy opacity concerning for pneumonia.  Patient was started on empiric antibiotics of vancomycin and cefepime. -Admit to a medical telemetry bed -Follow-up blood and sputum cultures/studies -Check MRSA nasal screening -Continue empiric antibiotics  vancomycin, azithromycin, and cefepime -Mucinex -Trend lactic acid level -Check CBC in a.m.   Acute respiratory failure with hypoxia, COPD exacerbation: On admission O2 saturations as low as 86% on room air.  Patient was initially on a nonrebreather and titrated down to 4 L nasal cannula oxygen with O2 saturations maintained higher than 92%.  Patient has been given albuterol inhaler and an 80 mg of Solu-Medrol IV.  Symptoms likely secondary to pneumonia seen above. -Continuous pulse oximetry with nasal cannula oxygen to maintain O2 saturation greater than 92%. -Incentive spirometry -Albuterol nebs bid daily and as needed shortness of breath/wheezing -Brovana and budesonide nebs in substitution for Advair -Prednisone 40 mg daily starting in a.m.  -Continue Singulair -Wean oxygen as tolerated  Chest pain: Acute.  Patient reports left-sided chest pain and soreness.  Troponin negative x2.  EKG showing some ST depressions noted in the lateral leads.  Suspect symptoms secondary to coughing and/or pneumonia. -Follow-up telemetry overnight  Nausea and vomiting: Patient reports having several episodes of nausea vomiting last night. -Aspiration precautions -Antiemetics as needed -Diet as tolerated  Macrocytic anemia: Chronic.  Hemoglobin 11.2 with elevated MCV. Patient has known history of vitamin D12 deficiency.  -Continue to monitor  Hypothyroidism -Continue levothyroxine  Chronic kidney disease stage IV: Stable.  Baseline creatinine appears to range from 1.8-2 on review of records on care everywhere. -Continue sodium bicarbonate -Continue to monitor  Essential hypertension: Blood pressures currently stable. -Continue metoprolol and amlodipine as tolerated  Mixed hyperlipidemia -Continue Lipitor  Mild memory loss: Patient has mild decrease in recent memory's. -Continue Namenda  GERD -Continue pharmacy substitution for Protonix  DVT prophylaxis: Lovenox Code Status: DNR Family  Communication: Discussed plan of care with the patient's daughter over the phone Disposition Plan: Discharge back to independent living once medically stable Consults called: None Admission status: inpatient   Norval Morton MD [facility] Pager [phone]   If 7PM-7AM, please contact night-coverage [email or web address] Password [email or web address]  08/20/2019, 8:45 AM

## 2019-08-20 NOTE — ED Notes (Signed)
Pt remains talkative without acute distress.  Denies any pain or need for meds.  POC updated.

## 2019-08-20 NOTE — Progress Notes (Signed)
Pharmacy Antibiotic Note  Lisa Cortez is a 85 y.o. female admitted on 08/20/2019 with pneumonia. Pharmacy has been consulted for vancomycin dosing. Pt is febrile with Tmax 101.3 and WBC is elevated at 12.9. SCr is elevated at 1.85 but appears to be her baseline. Lactic acid is 2.   Plan: Vancomycin 1gm IV x 1 then 750mg  IV Q48H F/u renal fxn, C&S, clinical status and peak/trough at [facility] F/u continuation of cefepime or other gram negative coverage  Height: 4\' 11"  (149.9 cm) Weight: 135 lb (61.2 kg)(per last office visit) IBW/kg (Calculated) : 43.2  Temp (24hrs), Avg:99.8 F (37.7 C), Min:98.2 F (36.8 C), Max:101.3 F (38.5 C)  Recent Labs  Lab 08/20/19 0524 08/20/19 0539  WBC 12.9*  --   CREATININE 1.85*  --   LATICACIDVEN  --  2.0*    Estimated Creatinine Clearance: 15.4 mL/min (A) (by C-G formula based on SCr of 1.85 mg/dL (H)).    Not on File  Antimicrobials this admission: Vanc 8/31>> Cefepime x 1 8/31  Dose adjustments this admission: N/A  Microbiology results: Pending  Thank you for allowing pharmacy to be a part of this patient's care.  Rowan Pollman, Rande Lawman 08/20/2019 8:37 AM

## 2019-08-20 NOTE — ED Provider Notes (Signed)
[facility] EMERGENCY DEPARTMENT Provider Note   CSN: [id number] Arrival date & time: 08/20/19  0408     History   Chief Complaint Chief Complaint  Patient presents with  . Respiratory Distress  . Chest Pain    HPI Lisa Cortez is a 85 y.o. female.     Level 5 caveat for respiratory distress.  Patient from living facility with shortness of breath, cough and chest pain.  She is unable to give much history.  EMS reports has been sick for about a week with respiratory difficulties, chills and cough.  She has chest pain the left side is worse with coughing and deep breathing.  She is found to be tachypneic per EMS but not hypoxic.  She arrives in a nonrebreather.  Patient does not know her medical history.  She denies any history of COPD or asthma but has been a smoker in the past.  Cough is nonproductive.  Does have some central chest pain with coughing.  No documented fever.  No leg pain or leg swelling.  EMS reports wheezing but did not give any medications.  No abdominal pain.  No nausea or vomiting.  No recent other country travel or known coronavirus contacts  The history is provided by the patient and the EMS personnel. The history is limited by the condition of the patient.  Chest Pain Associated symptoms: cough and shortness of breath   Associated symptoms: no abdominal pain, no back pain, no dizziness, no fever, no headache and no weakness     Past Medical History:  Diagnosis Date  . Hypertension     There are no active problems to display for this patient.   History reviewed. No pertinent surgical history.   OB History   No obstetric history on file.      Home Medications    Prior to Admission medications   Not on File    Family History No family history on file.  Social History Social History   Tobacco Use  . Smoking status: Never Smoker  . Smokeless tobacco: Never Used  Substance Use Topics  . Alcohol use: Yes    Comment:  occassional glass of wine  . Drug use: Never     Allergies   Patient has no allergy information on record.   Review of Systems Review of Systems  Constitutional: Negative for fever.  HENT: Negative for congestion and rhinorrhea.   Eyes: Negative for visual disturbance.  Respiratory: Positive for cough, chest tightness and shortness of breath.   Cardiovascular: Positive for chest pain.  Gastrointestinal: Negative for abdominal pain.  Genitourinary: Negative for dysuria.  Musculoskeletal: Negative for back pain.  Neurological: Negative for dizziness, weakness and headaches.  Psychiatric/Behavioral: Negative for dysphoric mood.    all other systems are negative except as noted in the HPI and PMH.    Physical Exam Updated Vital Signs BP (!) 160/89 (BP Location: Right Arm)   Pulse 96   Temp 98.2 F (36.8 C) (Oral)   Resp 20   SpO2 100%   Physical Exam Vitals signs and nursing note reviewed.  Constitutional:      General: She is in acute distress.     Appearance: She is well-developed.     Comments: Moderate respiratory distress, speaking short phrases  HENT:     Head: Normocephalic and atraumatic.     Mouth/Throat:     Pharynx: No oropharyngeal exudate.  Eyes:     Conjunctiva/sclera: Conjunctivae normal.  Pupils: Pupils are equal, round, and reactive to light.  Neck:     Musculoskeletal: Normal range of motion and neck supple.     Comments: No meningismus. Cardiovascular:     Rate and Rhythm: Normal rate and regular rhythm.     Heart sounds: Normal heart sounds. No murmur.  Pulmonary:     Effort: Pulmonary effort is normal. No respiratory distress.     Breath sounds: Wheezing present.     Comments: Diminished breath sounds with inspiratory and expiratory wheezing bilaterally Abdominal:     Palpations: Abdomen is soft.     Tenderness: There is no abdominal tenderness. There is no guarding or rebound.  Musculoskeletal: Normal range of motion.        General:  No tenderness.  Skin:    General: Skin is warm.     Capillary Refill: Capillary refill takes less than 2 seconds.     Findings: No rash.  Neurological:     General: No focal deficit present.     Mental Status: She is alert and oriented to person, place, and time. Mental status is at baseline.     Cranial Nerves: No cranial nerve deficit.     Motor: No abnormal muscle tone.     Coordination: Coordination normal.     Comments: No ataxia on finger to nose bilaterally. No pronator drift. 5/5 strength throughout. CN 2-12 intact.Equal grip strength. Sensation intact.   Psychiatric:        Behavior: Behavior normal.      ED Treatments / Results  Labs (all labs ordered are listed, but only abnormal results are displayed) Labs Reviewed  LACTIC ACID, PLASMA - Abnormal; Notable for the following components:      Result Value   Lactic Acid, Venous 2.0 (*)    All other components within normal limits  CBC WITH DIFFERENTIAL/PLATELET - Abnormal; Notable for the following components:   WBC 12.9 (*)    RBC 3.57 (*)    Hemoglobin 11.2 (*)    HCT 35.8 (*)    MCV 100.3 (*)    Neutro Abs 10.8 (*)    Lymphs Abs 0.6 (*)    Monocytes Absolute 1.5 (*)    All other components within normal limits  COMPREHENSIVE METABOLIC PANEL - Abnormal; Notable for the following components:   CO2 19 (*)    Glucose, Bld 118 (*)    BUN 30 (*)    Creatinine, Ser 1.85 (*)    Calcium 8.8 (*)    Albumin 3.3 (*)    AST 63 (*)    Alkaline Phosphatase 186 (*)    GFR calc non Af Amer 23 (*)    GFR calc Af Amer 27 (*)    All other components within normal limits  BRAIN NATRIURETIC PEPTIDE - Abnormal; Notable for the following components:   B Natriuretic Peptide 210.5 (*)    All other components within normal limits  CULTURE, BLOOD (ROUTINE X 2)  CULTURE, BLOOD (ROUTINE X 2)  SARS CORONAVIRUS 2 (HOSPITAL ORDER, [facility])  MRSA PCR SCREENING  EXPECTORATED SPUTUM ASSESSMENT W REFEX TO  RESP CULTURE  RESPIRATORY PANEL BY PCR  HIV ANTIBODY (ROUTINE TESTING W REFLEX)  LEGIONELLA PNEUMOPHILA SEROGP 1 UR AG  STREP PNEUMONIAE URINARY ANTIGEN  TROPONIN I (HIGH SENSITIVITY)  TROPONIN I (HIGH SENSITIVITY)    EKG EKG Interpretation  Date/Time:  Monday August 20 2019 08:05:07 EDT Ventricular Rate:  89 PR Interval:    QRS Duration: 83  QT Interval:  399 QTC Calculation: 486 R Axis:   9 Text Interpretation:  Sinus rhythm Prolonged PR interval Probable left atrial enlargement Borderline T abnormalities, lateral leads Borderline prolonged QT interval no change from prior on same day Confirmed by Madalyn Rob [phone]) on 08/20/2019 8:11:17 AM   Radiology Dg Chest Portable 1 View  Result Date: 08/20/2019 CLINICAL DATA:  Initial evaluation for acute chest pain, cough, chills. EXAM: PORTABLE CHEST 1 VIEW COMPARISON:  None available. FINDINGS: Cardiomegaly. Mediastinal silhouette within normal limits. Aortic atherosclerosis. Lungs mildly hypoinflated. Hazy and patchy opacity at the left lung base, which could reflect atelectasis and/or infiltrate. Mild scattered right basilar subsegmental atelectasis. Perihilar vascular congestion without pulmonary edema. No visible pleural effusion. No pneumothorax. No acute osseous finding. IMPRESSION: 1. Hazy and patchy opacity at the left lung base, which could reflect atelectasis and/or infiltrate. 2. Cardiomegaly with perihilar vascular congestion without overt pulmonary edema. 3. Aortic atherosclerosis. Electronically Signed   By: Jeannine Boga M.D.   On: 08/20/2019 04:45    Procedures Procedures (including critical care time)  Medications Ordered in ED Medications  ceFEPIme (MAXIPIME) 2 g in sodium chloride 0.9 % 100 mL IVPB (has no administration in time range)  enoxaparin (LOVENOX) injection 30 mg (has no administration in time range)  sodium chloride flush (NS) 0.9 % injection 3 mL (has no administration in time range)   acetaminophen (TYLENOL) tablet 650 mg (has no administration in time range)    Or  acetaminophen (TYLENOL) suppository 650 mg (has no administration in time range)  ondansetron (ZOFRAN) tablet 4 mg (has no administration in time range)    Or  ondansetron (ZOFRAN) injection 4 mg (has no administration in time range)  albuterol (PROVENTIL) (2.5 MG/3ML) 0.083% nebulizer solution 2.5 mg (has no administration in time range)  guaiFENesin (MUCINEX) 12 hr tablet 600 mg (has no administration in time range)  sodium chloride 0.9 % bolus 1,000 mL (has no administration in time range)  methylPREDNISolone sodium succinate (SOLU-MEDROL) 125 mg/2 mL injection 80 mg (80 mg Intravenous Given 08/20/19 0615)  magnesium sulfate IVPB 2 g 50 mL (2 g Intravenous New Bag/Given 08/20/19 0619)  albuterol (VENTOLIN HFA) 108 (90 Base) MCG/ACT inhaler 6 puff (6 puffs Inhalation Given 08/20/19 0457)  acetaminophen (TYLENOL) tablet 650 mg (650 mg Oral Given 08/20/19 0613)  sodium chloride 0.9 % bolus 1,000 mL (1,000 mLs Intravenous New Bag/Given 08/20/19 0641)     Initial Impression / Assessment and Plan / ED Course  I have reviewed the triage vital signs and the nursing notes.  Pertinent labs & imaging results that were available during my care of the patient were reviewed by me and considered in my medical decision making (see chart for details).       Respiratory distress with wheezing on exam.  Patient given bronchodilators, steroids and magnesium.  Febrile on arrival. Sepsis protocol with IVF and broad spectrum antibiotics after cultures obtained.  Lactate 2. CXR with L sided infiltrate.  Work of breathing improved after treatments. Maintaining saturations on nasal cannula. Coronavirus negative.   With new O2 requirement and persistent increased work of breathing will need admission for hypoxic respiratory failure due to pneumonia.  D/w Dr. Hal Hope.  Kleo Paige was evaluated in Emergency Department on  08/20/2019 for the symptoms described in the history of present illness. She was evaluated in the context of the global COVID-19 pandemic, which necessitated consideration that the patient might be at risk for infection with the SARS-CoV-2 virus that causes  COVID-19. Institutional protocols and algorithms that pertain to the evaluation of patients at risk for COVID-19 are in a state of rapid change based on information released by regulatory bodies including the CDC and federal and state organizations. These policies and algorithms were followed during the patient's care in the ED.  CRITICAL CARE Performed by: Ezequiel Essex Total critical care time: 40 minutes Critical care time was exclusive of separately billable procedures and treating other patients. Critical care was necessary to treat or prevent imminent or life-threatening deterioration. Critical care was time spent personally by me on the following activities: development of treatment plan with patient and/or surrogate as well as nursing, discussions with consultants, evaluation of patient's response to treatment, examination of patient, obtaining history from patient or surrogate, ordering and performing treatments and interventions, ordering and review of laboratory studies, ordering and review of radiographic studies, pulse oximetry and re-evaluation of patient's condition.    Final Clinical Impressions(s) / ED Diagnoses   Final diagnoses:  Acute respiratory failure with hypoxia ([place])  HCAP (healthcare-associated pneumonia)    ED Discharge Orders    None       Joslynne Klatt, Annie Main, MD 08/20/19 [phone]

## 2019-08-20 NOTE — ED Notes (Signed)
ED TO INPATIENT HANDOFF REPORT  ED Nurse Name and Phone #: Celene Squibb RN  S Name/Age/Gender Lisa Cortez 85 y.o. female Room/Bed: 038C/038C  Code Status   Code Status: DNR  Home/SNF/Other Home Patient oriented to: self, place, time and situation Is this baseline? Yes   Triage Complete: Triage complete  Chief Complaint sob  Triage Note Per [facility], pt from [facility] with a c/o resp distress, CP, cough, and chills. CP is centrally located and increases with deep breathing and coughing. Pt on NRB at 15 lpm. 12 lead unremarkable.  148/92 HR 80s RR 36    Allergies Allergies  Allergen Reactions  . Tenex [Guanfacine Hcl] Other (See Comments)    unknown  . Calcitonin Other (See Comments)    unknown  . Fosamax [Alendronate Sodium] Other (See Comments)    unknown  . Gatifloxacin Other (See Comments)    unknown  . Procrit [Epoetin (Alfa)] Other (See Comments)    unknown  . Verapamil Other (See Comments)    unknown    Level of Care/Admitting Diagnosis ED Disposition    ED Disposition Condition [facility]: [place] [[id number]]  Level of Care: Telemetry Medical [id number]  Covid Evaluation: Confirmed COVID Negative  [facility]: [id number]  Admitting Physician: Norval Morton [id number]  Attending Physician: Norval Morton [id number]  Estimated length of stay: past midnight tomorrow  Certification:: I certify this patient will need inpatient services for at least 2 midnights  PT Class (Do Not Modify): Inpatient [101]  PT Acc Code (Do Not Modify): Private [1]       B Medical/Surgery History Past Medical History:  Diagnosis Date  . Chronic kidney disease (CKD), stage IV (severe) ([place])   . Hyperlipidemia   . Hypertension   . Hypothyroidism   . 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 BREAST REDUCTION SURGERY    . CHOLECYSTECTOMY       A IV  Location/Drains/Wounds Patient Lines/Drains/Airways Status   Active Line/Drains/Airways    Name:   Placement date:   Placement time:   Site:   Days:   Peripheral IV 08/20/19 Right;Anterior Forearm   08/20/19    0559    Forearm   less than 1          Intake/Output Last 24 hours  Intake/Output Summary (Last 24 hours) at 08/20/2019 1100 Last data filed at 08/20/2019 0915 Gross per 24 hour  Intake 1150 ml  Output -  Net 1150 ml    Labs/Imaging Results for orders placed or performed during the hospital encounter of 08/20/19 (from the past 48 hour(s))  Blood culture (routine x 2)     Status: None (Preliminary result)   Collection Time: 08/20/19  4:43 AM   Specimen: BLOOD RIGHT HAND  Result Value Ref Range   Specimen Description BLOOD RIGHT HAND    Special Requests      BOTTLES DRAWN AEROBIC ONLY Blood Culture results may not be optimal due to an inadequate volume of blood received in culture bottles   Culture      NO GROWTH < 12 HOURS Performed at [facility] [address] [place], [place] [address]    Report Status PENDING   CBC with Differential/Platelet     Status: Abnormal   Collection Time: 08/20/19  5:24 AM  Result Value Ref Range   WBC 12.9 (H) 4.0 - 10.5  K/uL   RBC 3.57 (L) 3.87 - 5.11 MIL/uL   Hemoglobin 11.2 (L) 12.0 - 15.0 g/dL   HCT 35.8 (L) 36.0 - 46.0 %   MCV 100.3 (H) 80.0 - 100.0 fL   MCH 31.4 26.0 - 34.0 pg   MCHC 31.3 30.0 - 36.0 g/dL   RDW 13.7 11.5 - 15.5 %   Platelets 213 150 - 400 K/uL   nRBC 0.0 0.0 - 0.2 %   Neutrophils Relative % 83 %   Neutro Abs 10.8 (H) 1.7 - 7.7 K/uL   Lymphocytes Relative 5 %   Lymphs Abs 0.6 (L) 0.7 - 4.0 K/uL   Monocytes Relative 12 %   Monocytes Absolute 1.5 (H) 0.1 - 1.0 K/uL   Eosinophils Relative 0 %   Eosinophils Absolute 0.0 0.0 - 0.5 K/uL   Basophils Relative 0 %   Basophils Absolute 0.0 0.0 - 0.1 K/uL   Immature Granulocytes 0 %   Abs Immature Granulocytes 0.05 0.00 - 0.07 K/uL    Comment: Performed  at [facility], [address] [place], [place] [address]  Comprehensive metabolic panel     Status: Abnormal   Collection Time: 08/20/19  5:24 AM  Result Value Ref Range   Sodium 139 135 - 145 mmol/L   Potassium 4.3 3.5 - 5.1 mmol/L   Chloride 108 98 - 111 mmol/L   CO2 19 (L) 22 - 32 mmol/L   Glucose, Bld 118 (H) 70 - 99 mg/dL   BUN 30 (H) 8 - 23 mg/dL   Creatinine, Ser 1.85 (H) 0.44 - 1.00 mg/dL   Calcium 8.8 (L) 8.9 - 10.3 mg/dL   Total Protein 7.4 6.5 - 8.1 g/dL   Albumin 3.3 (L) 3.5 - 5.0 g/dL   AST 63 (H) 15 - 41 U/L   ALT 40 0 - 44 U/L   Alkaline Phosphatase 186 (H) 38 - 126 U/L   Total Bilirubin 0.8 0.3 - 1.2 mg/dL   GFR calc non Af Amer 23 (L) >60 mL/min   GFR calc Af Amer 27 (L) >60 mL/min   Anion gap 12 5 - 15    Comment: Performed at [facility], [place] [address] [place], [place] [address]  Troponin I (High Sensitivity)     Status: None   Collection Time: 08/20/19  5:24 AM  Result Value Ref Range   Troponin I (High Sensitivity) 11 <18 ng/L    Comment: (NOTE) Elevated high sensitivity troponin I (hsTnI) values and significant  changes across serial measurements may suggest ACS but many other  chronic and acute conditions are known to elevate hsTnI results.  Refer to the "Links" section for chest pain algorithms and additional  guidance. Performed at [facility], [place] [address], [place], [place] [address]   Brain natriuretic peptide     Status: Abnormal   Collection Time: 08/20/19  5:24 AM  Result Value Ref Range   B Natriuretic Peptide 210.5 (H) 0.0 - 100.0 pg/mL    Comment: Performed at [facility] [address] [place], [place] [address]  Blood culture (routine x 2)     Status: None (Preliminary result)   Collection Time: 08/20/19  5:30 AM   Specimen: BLOOD  Result Value Ref Range   Specimen Description BLOOD RIGHT THUMB    Special Requests      BOTTLES DRAWN AEROBIC ONLY Blood Culture results may not be optimal due to an  inadequate volume of blood received  in culture bottles   Culture      NO GROWTH < 12 HOURS Performed at [facility], [place] [address], [place], [place] [address]    Report Status PENDING   Lactic acid, plasma     Status: Abnormal   Collection Time: 08/20/19  5:39 AM  Result Value Ref Range   Lactic Acid, Venous 2.0 (HH) 0.5 - 1.9 mmol/L    Comment: CRITICAL RESULT CALLED TO, READ BACK BY AND VERIFIED WITH: PEREZ P,RN 08/20/19 0606 WAYK Performed at [facility], [place] [address] [place], [place] [address]   SARS Coronavirus 2 [facility] order, Performed in [facility] hospital lab) Nasopharyngeal Nasopharyngeal Swab     Status: None   Collection Time: 08/20/19  6:05 AM   Specimen: Nasopharyngeal Swab  Result Value Ref Range   SARS Coronavirus 2 NEGATIVE NEGATIVE    Comment: (NOTE) If result is NEGATIVE SARS-CoV-2 target nucleic acids are NOT DETECTED. The SARS-CoV-2 RNA is generally detectable in upper and lower  respiratory specimens during the acute phase of infection. The lowest  concentration of SARS-CoV-2 viral copies this assay can detect is 250  copies / mL. A negative result does not preclude SARS-CoV-2 infection  and should not be used as the sole basis for treatment or other  patient management decisions.  A negative result may occur with  improper specimen collection / handling, submission of specimen other  than nasopharyngeal swab, presence of viral mutation(s) within the  areas targeted by this assay, and inadequate number of viral copies  (<250 copies / mL). A negative result must be combined with clinical  observations, patient history, and epidemiological information. If result is POSITIVE SARS-CoV-2 target nucleic acids are DETECTED. The SARS-CoV-2 RNA is generally detectable in upper and lower  respiratory specimens dur ing the acute phase of infection.  Positive  results are indicative of active infection with SARS-CoV-2.  Clinical  correlation with  patient history and other diagnostic information is  necessary to determine patient infection status.  Positive results do  not rule out bacterial infection or co-infection with other viruses. If result is PRESUMPTIVE POSTIVE SARS-CoV-2 nucleic acids MAY BE PRESENT.   A presumptive positive result was obtained on the submitted specimen  and confirmed on repeat testing.  While 2019 novel coronavirus  (SARS-CoV-2) nucleic acids may be present in the submitted sample  additional confirmatory testing may be necessary for epidemiological  and / or clinical management purposes  to differentiate between  SARS-CoV-2 and other Sarbecovirus currently known to infect humans.  If clinically indicated additional testing with an alternate test  methodology [phone]) is advised. The SARS-CoV-2 RNA is generally  detectable in upper and lower respiratory sp ecimens during the acute  phase of infection. The expected result is Negative. Fact Sheet for Patients:  StrictlyIdeas.no Fact Sheet for Healthcare Providers: BankingDealers.co.za This test is not yet approved or cleared by the [place] FDA and has been authorized for detection and/or diagnosis of SARS-CoV-2 by FDA under an Emergency Use Authorization (EUA).  This EUA will remain in effect (meaning this test can be used) for the duration of the COVID-19 declaration under Section 564(b)(1) of the Act, 21 U.S.C. section 360bbb-3(b)(1), unless the authorization is terminated or revoked sooner. Performed at [facility], [place] [address] [place], [place] [address]   Troponin I (High Sensitivity)     Status: None   Collection Time: 08/20/19  6:55 AM  Result Value Ref Range  Troponin I (High Sensitivity) 11 <18 ng/L    Comment: (NOTE) Elevated high sensitivity troponin I (hsTnI) values and significant  changes across serial measurements may suggest ACS but many other  chronic and acute  conditions are known to elevate hsTnI results.  Refer to the "Links" section for chest pain algorithms and additional  guidance. Performed at [facility], [place] [address], [place], [place] [address]    Dg Chest Portable 1 View  Result Date: 08/20/2019 CLINICAL DATA:  Initial evaluation for acute chest pain, cough, chills. EXAM: PORTABLE CHEST 1 VIEW COMPARISON:  None available. FINDINGS: Cardiomegaly. Mediastinal silhouette within normal limits. Aortic atherosclerosis. Lungs mildly hypoinflated. Hazy and patchy opacity at the left lung base, which could reflect atelectasis and/or infiltrate. Mild scattered right basilar subsegmental atelectasis. Perihilar vascular congestion without pulmonary edema. No visible pleural effusion. No pneumothorax. No acute osseous finding. IMPRESSION: 1. Hazy and patchy opacity at the left lung base, which could reflect atelectasis and/or infiltrate. 2. Cardiomegaly with perihilar vascular congestion without overt pulmonary edema. 3. Aortic atherosclerosis. Electronically Signed   By: Jeannine Boga M.D.   On: 08/20/2019 04:45    Pending Labs Unresulted Labs (From admission, onward)    Start     Ordered   08/21/19 0500  CBC  Tomorrow morning,   R     08/20/19 0806   08/21/19 [id number]  Basic metabolic panel  Tomorrow morning,   R     08/20/19 0806   08/20/19 0914  Lactic acid, plasma  Once-Timed,   STAT     08/20/19 0913   08/20/19 0803  MRSA PCR Screening  Once,   STAT     08/20/19 0806   08/20/19 0803  Culture, sputum-assessment  Once,   R     08/20/19 0806   08/20/19 0803  Legionella Pneumophila Serogp 1 Ur Ag  Once,   STAT     08/20/19 0806   08/20/19 0803  Strep pneumoniae urinary antigen  Once,   STAT     08/20/19 0806          Vitals/Pain Today's Vitals   08/20/19 0926 08/20/19 0930 08/20/19 0945 08/20/19 0948  BP:  134/61 (!) 136/59   Pulse:  87 88   Resp:  20 (!) 23   Temp:    98 F (36.7 C)  TempSrc:    Oral  SpO2:  98% 100%    Weight:      Height:      PainSc: 0-No pain       Isolation Precautions No active isolations  Medications Medications  enoxaparin (LOVENOX) injection 30 mg (has no administration in time range)  sodium chloride flush (NS) 0.9 % injection 3 mL (has no administration in time range)  acetaminophen (TYLENOL) tablet 650 mg (has no administration in time range)    Or  acetaminophen (TYLENOL) suppository 650 mg (has no administration in time range)  ondansetron (ZOFRAN) tablet 4 mg (has no administration in time range)    Or  ondansetron (ZOFRAN) injection 4 mg (has no administration in time range)  guaiFENesin (MUCINEX) 12 hr tablet 600 mg (has no administration in time range)  vancomycin (VANCOCIN) IVPB 1000 mg/200 mL premix (has no administration in time range)  vancomycin (VANCOCIN) IVPB 750 mg/150 ml premix (has no administration in time range)  azithromycin (ZITHROMAX) 500 mg in sodium chloride 0.9 % 250 mL IVPB (500 mg Intravenous New Bag/Given 08/20/19 0946)  predniSONE (DELTASONE) tablet 40 mg (has no administration  in time range)  albuterol (PROVENTIL) (2.5 MG/3ML) 0.083% nebulizer solution 2.5 mg (has no administration in time range)  albuterol (PROVENTIL) (2.5 MG/3ML) 0.083% nebulizer solution 2.5 mg (2.5 mg Nebulization Given 08/20/19 0949)  budesonide (PULMICORT) nebulizer solution 0.5 mg (0.5 mg Nebulization Given 08/20/19 1005)  arformoterol (BROVANA) nebulizer solution 15 mcg (15 mcg Nebulization Not Given 08/20/19 1020)  atorvastatin (LIPITOR) tablet 10 mg (has no administration in time range)  metoprolol tartrate (LOPRESSOR) tablet 25 mg (has no administration in time range)  aspirin EC tablet 81 mg (has no administration in time range)  memantine (NAMENDA) tablet 5 mg (has no administration in time range)  amLODipine (NORVASC) tablet 5 mg (has no administration in time range)  levothyroxine (SYNTHROID) tablet 75 mcg (has no administration in time range)  pramipexole  (MIRAPEX) tablet 0.25 mg (has no administration in time range)  B-12 TABS 1,000 mcg (has no administration in time range)  montelukast (SINGULAIR) tablet 10 mg (has no administration in time range)  sodium bicarbonate tablet 650 mg (has no administration in time range)  pantoprazole (PROTONIX) EC tablet 40 mg (has no administration in time range)  methylPREDNISolone sodium succinate (SOLU-MEDROL) 125 mg/2 mL injection 80 mg (80 mg Intravenous Given 08/20/19 0615)  magnesium sulfate IVPB 2 g 50 mL (0 g Intravenous Stopped 08/20/19 0730)  albuterol (VENTOLIN HFA) 108 (90 Base) MCG/ACT inhaler 6 puff (6 puffs Inhalation Given 08/20/19 0457)  ceFEPIme (MAXIPIME) 2 g in sodium chloride 0.9 % 100 mL IVPB (0 g Intravenous Stopped 08/20/19 0855)  acetaminophen (TYLENOL) tablet 650 mg (650 mg Oral Given 08/20/19 [id number])  sodium chloride 0.9 % bolus 1,000 mL (0 mLs Intravenous Stopped 08/20/19 0915)  sodium chloride 0.9 % bolus 1,000 mL (1,000 mLs Intravenous New Bag/Given 08/20/19 0919)    Mobility walks Low fall risk   Focused Assessments Pulmonary Assessment Handoff:  Lung sounds: Bilateral Breath Sounds: Diminished, Expiratory wheezes L Breath Sounds: Diminished, Expiratory wheezes R Breath Sounds: Diminished, Expiratory wheezes O2 Device: Nasal Cannula O2 Flow Rate (L/min): 4 L/min      R Recommendations: See Admitting Provider Note  Report given to:   Additional Notes:

## 2019-08-20 NOTE — ED Triage Notes (Signed)
Per [facility], pt from [facility] with a c/o resp distress, CP, cough, and chills. CP is centrally located and increases with deep breathing and coughing. Pt on NRB at 15 lpm. 12 lead unremarkable.  148/92 HR 80s RR 36

## 2019-08-20 NOTE — Progress Notes (Signed)
  Pt orientation to unit, room and routine. Information packet given to patient/family and safety video watched.  Admission INP armband ID verified with patient/family, and in place. SR up x 2, fall risk assessment complete with Patient and family verbalizing understanding of risks associated with falls. Pt verbalizes an understanding of how to use the call bell and to call for help before getting out of bed.  Skin, clean-dry- intact without evidence of bruising, or skin tears.   No evidence of skin break down noted on exam. Will cont to monitor and assist as needed.  Hosie Spangle, [place] 08/20/2019 2:31 PM

## 2019-08-20 NOTE — ED Notes (Signed)
Dr. Tamala Julian in with patient for admit.  Daughter also called and MD to update.  Pt alert and talkative with considerable cough noted.    Franchot Gallo  [phone]

## 2019-08-20 NOTE — ED Notes (Signed)
ED TO INPATIENT HANDOFF REPORT  ED Nurse Name and Phone #:  [phone]  S Name/Age/Gender Lisa Cortez 85 y.o. female Room/Bed: 033C/033C  Code Status   Code Status: Not on file  Home/SNF/Other [place]  Patient oriented to: self, place, time and situation Is this baseline? Yes   Triage Complete: Triage complete  Chief Complaint sob  Triage Note [facility], pt from [facility] with a c/o resp distress, CP, cough, and chills. CP is centrally located and increases with deep breathing and coughing. Pt on NRB at 15 lpm. 12 lead unremarkable.  148/92 HR 80s RR 36    Allergies Not on File  Level of Care/Admitting Diagnosis ED Disposition    ED Disposition Condition Comment   Admit  The patient appears reasonably stabilized for admission considering the current resources, flow, and capabilities available in the ED at this time, and I doubt any other [facility] requiring further screening and/or treatment in the ED prior to admission is  present.       B Medical/Surgery History Past Medical History:  Diagnosis Date  . Hypertension    History reviewed. No pertinent surgical history.   A IV Location/Drains/Wounds Patient Lines/Drains/Airways Status   Active Line/Drains/Airways    Name:   Placement date:   Placement time:   Site:   Days:   Peripheral IV 08/20/19 Right;Anterior Forearm   08/20/19    0559    Forearm   less than 1          Intake/Output Last 24 hours No intake or output data in the 24 hours ending 08/20/19 [id number]  Labs/Imaging Results for orders placed or performed during the hospital encounter of 08/20/19 (from the past 48 hour(s))  CBC with Differential/Platelet     Status: Abnormal   Collection Time: 08/20/19  5:24 AM  Result Value Ref Range   WBC 12.9 (H) 4.0 - 10.5 K/uL   RBC 3.57 (L) 3.87 - 5.11 MIL/uL   Hemoglobin 11.2 (L) 12.0 - 15.0 g/dL   HCT 35.8 (L) 36.0 - 46.0 %   MCV 100.3 (H) 80.0 - 100.0 fL   MCH 31.4 26.0 -  34.0 pg   MCHC 31.3 30.0 - 36.0 g/dL   RDW 13.7 11.5 - 15.5 %   Platelets 213 150 - 400 K/uL   nRBC 0.0 0.0 - 0.2 %   Neutrophils Relative % 83 %   Neutro Abs 10.8 (H) 1.7 - 7.7 K/uL   Lymphocytes Relative 5 %   Lymphs Abs 0.6 (L) 0.7 - 4.0 K/uL   Monocytes Relative 12 %   Monocytes Absolute 1.5 (H) 0.1 - 1.0 K/uL   Eosinophils Relative 0 %   Eosinophils Absolute 0.0 0.0 - 0.5 K/uL   Basophils Relative 0 %   Basophils Absolute 0.0 0.0 - 0.1 K/uL   Immature Granulocytes 0 %   Abs Immature Granulocytes 0.05 0.00 - 0.07 K/uL    Comment: Performed at [facility], [address], [place], [place] [address]  Comprehensive metabolic panel     Status: Abnormal   Collection Time: 08/20/19  5:24 AM  Result Value Ref Range   Sodium 139 135 - 145 mmol/L   Potassium 4.3 3.5 - 5.1 mmol/L   Chloride 108 98 - 111 mmol/L   CO2 19 (L) 22 - 32 mmol/L   Glucose, Bld 118 (H) 70 - 99 mg/dL   BUN 30 (H) 8 - 23 mg/dL   Creatinine, Ser 1.85 (  H) 0.44 - 1.00 mg/dL   Calcium 8.8 (L) 8.9 - 10.3 mg/dL   Total Protein 7.4 6.5 - 8.1 g/dL   Albumin 3.3 (L) 3.5 - 5.0 g/dL   AST 63 (H) 15 - 41 U/L   ALT 40 0 - 44 U/L   Alkaline Phosphatase 186 (H) 38 - 126 U/L   Total Bilirubin 0.8 0.3 - 1.2 mg/dL   GFR calc non Af Amer 23 (L) >60 mL/min   GFR calc Af Amer 27 (L) >60 mL/min   Anion gap 12 5 - 15    Comment: Performed at [facility] [address], [place], [place] [address]  Troponin I (High Sensitivity)     Status: None   Collection Time: 08/20/19  5:24 AM  Result Value Ref Range   Troponin I (High Sensitivity) 11 <18 ng/L    Comment: (NOTE) Elevated high sensitivity troponin I (hsTnI) values and significant  changes across serial measurements may suggest ACS but many other  chronic and acute conditions are known to elevate hsTnI results.  Refer to the "Links" section for chest pain algorithms and additional  guidance. Performed at [facility], [place] [address], [place],  [place] [address]   Brain natriuretic peptide     Status: Abnormal   Collection Time: 08/20/19  5:24 AM  Result Value Ref Range   B Natriuretic Peptide 210.5 (H) 0.0 - 100.0 pg/mL    Comment: Performed at [facility] [address], [place], [place] [address]  Lactic acid, plasma     Status: Abnormal   Collection Time: 08/20/19  5:39 AM  Result Value Ref Range   Lactic Acid, Venous 2.0 (HH) 0.5 - 1.9 mmol/L    Comment: CRITICAL RESULT CALLED TO, READ BACK BY AND VERIFIED WITH: Trennon Torbeck P,RN 08/20/19 0606 WAYK Performed at [facility], [place] [address] [place], [place] [address]    Dg Chest Portable 1 View  Result Date: 08/20/2019 CLINICAL DATA:  Initial evaluation for acute chest pain, cough, chills. EXAM: PORTABLE CHEST 1 VIEW COMPARISON:  None available. FINDINGS: Cardiomegaly. Mediastinal silhouette within normal limits. Aortic atherosclerosis. Lungs mildly hypoinflated. Hazy and patchy opacity at the left lung base, which could reflect atelectasis and/or infiltrate. Mild scattered right basilar subsegmental atelectasis. Perihilar vascular congestion without pulmonary edema. No visible pleural effusion. No pneumothorax. No acute osseous finding. IMPRESSION: 1. Hazy and patchy opacity at the left lung base, which could reflect atelectasis and/or infiltrate. 2. Cardiomegaly with perihilar vascular congestion without overt pulmonary edema. 3. Aortic atherosclerosis. Electronically Signed   By: Jeannine Boga M.D.   On: 08/20/2019 04:45    Pending Labs Unresulted Labs (From admission, onward)    Start     Ordered   08/20/19 0432  Blood culture (routine x 2)  BLOOD CULTURE X 2,   STAT     08/20/19 0432   08/20/19 0432  SARS Coronavirus 2 [facility] order, Performed in [facility] hospital lab) Nasopharyngeal Nasopharyngeal Swab  (Symptomatic/High Risk of Exposure/Tier 1 Patients Labs with Precautions)  Once,   STAT    Question Answer Comment  Is this test for diagnosis or screening  Diagnosis of ill patient   Symptomatic for COVID-19 as defined by CDC Yes   Date of Symptom Onset 08/13/2019   Hospitalized for COVID-19 Yes   Admitted to ICU for COVID-19 No   Previously tested for COVID-19 No   Resident in a congregate (group) care setting Yes  Employed in healthcare setting No   Pregnant No      08/20/19 0432          Vitals/Pain Today's Vitals   08/20/19 0430 08/20/19 0601 08/20/19 0630 08/20/19 0645  BP: (!) 148/83 (!) 142/57 (!) 114/54 (!) 112/53  Pulse: (!) 103 95 95 93  Resp: (!) 21 (!) 27 (!) 23 (!) 26  Temp:  (!) 101.3 F (38.5 C)    TempSrc:  Rectal    SpO2: 90% 100% 98% 99%  PainSc:        Isolation Precautions Airborne and Contact precautions  Medications Medications  magnesium sulfate IVPB 2 g 50 mL (2 g Intravenous New Bag/Given 08/20/19 0619)  ceFEPIme (MAXIPIME) 2 g in sodium chloride 0.9 % 100 mL IVPB (has no administration in time range)  methylPREDNISolone sodium succinate (SOLU-MEDROL) 125 mg/2 mL injection 80 mg (80 mg Intravenous Given 08/20/19 0615)  albuterol (VENTOLIN HFA) 108 (90 Base) MCG/ACT inhaler 6 puff (6 puffs Inhalation Given 08/20/19 0457)  acetaminophen (TYLENOL) tablet 650 mg (650 mg Oral Given 08/20/19 0613)  sodium chloride 0.9 % bolus 1,000 mL (1,000 mLs Intravenous New Bag/Given 08/20/19 0641)    Mobility walks with device Low fall risk   Focused Assessments    R Recommendations: See Admitting Provider Note  Report given to:   Additional Notes:

## 2019-08-21 ENCOUNTER — Inpatient Hospital Stay (HOSPITAL_COMMUNITY): Payer: Medicare HMO

## 2019-08-21 ENCOUNTER — Other Ambulatory Visit (HOSPITAL_COMMUNITY): Payer: Medicare HMO

## 2019-08-21 DIAGNOSIS — J9601 Acute respiratory failure with hypoxia: Secondary | ICD-10-CM

## 2019-08-21 LAB — BASIC METABOLIC PANEL
Anion gap: 8 (ref 5–15)
BUN: 29 mg/dL — ABNORMAL HIGH (ref 8–23)
CO2: 18 mmol/L — ABNORMAL LOW (ref 22–32)
Calcium: 8.5 mg/dL — ABNORMAL LOW (ref 8.9–10.3)
Chloride: 112 mmol/L — ABNORMAL HIGH (ref 98–111)
Creatinine, Ser: 1.52 mg/dL — ABNORMAL HIGH (ref 0.44–1.00)
GFR calc Af Amer: 34 mL/min — ABNORMAL LOW (ref >60)
GFR calc non Af Amer: 29 mL/min — ABNORMAL LOW (ref >60)
Glucose, Bld: 122 mg/dL — ABNORMAL HIGH (ref 70–99)
Potassium: 4.8 mmol/L (ref 3.5–5.1)
Sodium: 138 mmol/L (ref 135–145)

## 2019-08-21 LAB — EXPECTORATED SPUTUM ASSESSMENT W GRAM STAIN, RFLX TO RESP C

## 2019-08-21 LAB — CBC
HCT: 29.7 % — ABNORMAL LOW (ref 36.0–46.0)
Hemoglobin: 9.7 g/dL — ABNORMAL LOW (ref 12.0–15.0)
MCH: 31.5 pg (ref 26.0–34.0)
MCHC: 32.7 g/dL (ref 30.0–36.0)
MCV: 96.4 fL (ref 80.0–100.0)
Platelets: 171 10*3/uL (ref 150–400)
RBC: 3.08 MIL/uL — ABNORMAL LOW (ref 3.87–5.11)
RDW: 13.8 % (ref 11.5–15.5)
WBC: 12.4 10*3/uL — ABNORMAL HIGH (ref 4.0–10.5)
nRBC: 0 % (ref 0.0–0.2)

## 2019-08-21 LAB — LEGIONELLA PNEUMOPHILA SEROGP 1 UR AG: L. pneumophila Serogp 1 Ur Ag: NEGATIVE

## 2019-08-21 MED ORDER — MUSCLE RUB 10-15 % EX CREA
1.0000 "application " | TOPICAL_CREAM | CUTANEOUS | Status: DC | PRN
  Administered 2019-08-22 (×2): 1 via TOPICAL
  Filled 2019-08-21: qty 85

## 2019-08-21 MED ORDER — SODIUM CHLORIDE 0.9 % IV SOLN
INTRAVENOUS | Status: DC
  Administered 2019-08-21 – 2019-08-22 (×2): via INTRAVENOUS

## 2019-08-21 MED ORDER — TRAMADOL HCL 50 MG PO TABS
50.0000 mg | ORAL_TABLET | Freq: Four times a day (QID) | ORAL | Status: DC | PRN
  Administered 2019-08-21 – 2019-08-24 (×5): 50 mg via ORAL
  Filled 2019-08-21 (×5): qty 1

## 2019-08-21 MED ORDER — ADULT MULTIVITAMIN W/MINERALS CH
1.0000 | ORAL_TABLET | Freq: Every day | ORAL | Status: DC
  Administered 2019-08-21 – 2019-08-24 (×4): 1 via ORAL
  Filled 2019-08-21 (×5): qty 1

## 2019-08-21 MED ORDER — TRAZODONE HCL 50 MG PO TABS: 50.0000 mg | ORAL_TABLET | Freq: Once | ORAL | Status: DC

## 2019-08-21 MED ORDER — SODIUM CHLORIDE 0.9 % IV SOLN
1.0000 g | INTRAVENOUS | Status: DC
  Administered 2019-08-21 – 2019-08-24 (×4): 1 g via INTRAVENOUS
  Filled 2019-08-21 (×4): qty 10

## 2019-08-21 MED ORDER — METHYLPREDNISOLONE SODIUM SUCC 40 MG IJ SOLR
40.0000 mg | Freq: Three times a day (TID) | INTRAMUSCULAR | Status: DC
  Administered 2019-08-21 – 2019-08-23 (×6): 40 mg via INTRAVENOUS
  Filled 2019-08-21 (×6): qty 1

## 2019-08-21 NOTE — Evaluation (Signed)
Occupational Therapy Evaluation Patient Details Name: Lisa Cortez MRN: [id number] DOB: 09-19-1925 Today's Date: 08/21/2019    History of Present Illness Pt is a 85 y/o female admitted secondary to sepsis and hypoxic respiratory failure. Imaging suspicious for L lower lobe PNA. PMH includes CKD, dementia, HTN, COPD, and merkel cell carcinoma.    Clinical Impression   Patient presenting with decreased I in self care, balance, functional mobility/transfers, endurance, safety awareness, and strength, Patient lives in an indepenent living facility with use of RW in apartment and rollator in community PTA. Meals are provided. She does not drive.  Patient currently functioning at min - mod A. Patient will benefit from acute OT to increase overall independence in the areas of ADLs, functional mobility, and safety awareness in order to safely discharge to next venue of care. Pt on 3 L 2 via [place] and although she reports SOB O2 saturation remained above 96% throughout. Pt was incontinent of BM and able to stand while assisted with hygiene.    Follow Up Recommendations  SNF;Supervision/Assistance - 24 hour    Equipment Recommendations  Other (comment)(defer to next venue of care)       Precautions / Restrictions Precautions Precautions: Fall Restrictions Weight Bearing Restrictions: No RLE Weight Bearing: Weight bearing as tolerated      Mobility Bed Mobility Overal bed mobility: Needs Assistance Bed Mobility: Supine to Sit;Sit to Supine     Supine to sit: Min assist Sit to supine: Supervision      Transfers Overall transfer level: Needs assistance Equipment used: Rolling walker (2 wheeled) Transfers: Sit to/from Stand Sit to Stand: Min assist              Balance Overall balance assessment: Needs assistance Sitting-balance support: No upper extremity supported;Feet supported Sitting balance-Leahy Scale: Fair     Standing balance support: Single extremity supported;During  functional activity Standing balance-Leahy Scale: Good            ADL either performed or assessed with clinical judgement   ADL Overall ADL's : Needs assistance/impaired Eating/Feeding: Set up;Sitting   Grooming: Wash/dry hands;Wash/dry face;Oral care;Sitting;Set up   Upper Body Bathing: Sitting;Set up   Lower Body Bathing: Minimal assistance;Sit to/from stand   Upper Body Dressing : Set up;Sitting   Lower Body Dressing: Moderate assistance;Sit to/from stand   Toilet Transfer: Moderate assistance;RW   Toileting- Clothing Manipulation and Hygiene: Moderate assistance               Vision Baseline Vision/History: Wears glasses Wears Glasses: Reading only Patient Visual Report: No change from baseline Vision Assessment?: No apparent visual deficits            Pertinent Vitals/Pain Pain Assessment: No/denies pain     Hand Dominance Right   Extremity/Trunk Assessment Upper Extremity Assessment Upper Extremity Assessment: Generalized weakness   Lower Extremity Assessment Lower Extremity Assessment: Generalized weakness   Cervical / Trunk Assessment Cervical / Trunk Assessment: Kyphotic   Communication Communication Communication: HOH   Cognition Arousal/Alertness: Awake/alert Behavior During Therapy: WFL for tasks assessed/performed Overall Cognitive Status: History of cognitive impairments - at baseline       General Comments: Per chart hx of dementia.               Home Living Family/patient expects to be discharged to:: Private residence Living Arrangements: Alone Available Help at Discharge: Family;Available PRN/intermittently Type of Home: Independent living facility Home Access: Elevator     Home Layout: One level     Bathroom  Shower/Tub: Occupational psychologist: Handicapped height     Home Equipment: Environmental consultant - 4 wheels;Cane - single point;Shower seat - built in   Additional Comments: Pt reports living in independent  living facility.       Prior Functioning/Environment Level of Independence: Independent with assistive device(s)        Comments: reports using rollator for ambulation.         OT Problem List: Decreased strength;Decreased knowledge of use of DME or AE;Decreased coordination;Decreased knowledge of precautions;Decreased activity tolerance;Decreased cognition;Cardiopulmonary status limiting activity;Impaired balance (sitting and/or standing);Decreased safety awareness      OT Treatment/Interventions: Self-care/ADL training;Balance training;Therapeutic exercise;Neuromuscular education;Therapeutic activities;Energy conservation;DME and/or AE instruction;Patient/family education    OT Goals(Current goals can be found in the care plan section) Acute Rehab OT Goals Patient Stated Goal: to be able to breathe better OT Goal Formulation: With patient/family Time For Goal Achievement: 09/04/19 Potential to Achieve Goals: Good ADL Goals Pt Will Perform Lower Body Bathing: with supervision Pt Will Perform Lower Body Dressing: with supervision Pt Will Transfer to Toilet: with supervision Pt Will Perform Toileting - Clothing Manipulation and hygiene: with supervision  OT Frequency: Min 2X/week   Barriers to D/C:    none known at this time          AM-PAC OT "6 Clicks" Daily Activity     Outcome Measure Help from another person eating meals?: A Little Help from another person taking care of personal grooming?: A Little Help from another person toileting, which includes using toliet, bedpan, or urinal?: A Little Help from another person bathing (including washing, rinsing, drying)?: A Little Help from another person to put on and taking off regular upper body clothing?: A Little Help from another person to put on and taking off regular lower body clothing?: A Lot 6 Click Score: 17   End of Session Equipment Utilized During Treatment: Rolling walker;Oxygen Nurse Communication: Mobility  status  Activity Tolerance: Patient limited by fatigue Patient left: in bed;with call bell/phone within reach;with bed alarm set;with family/visitor present  OT Visit Diagnosis: Unsteadiness on feet (R26.81);Muscle weakness (generalized) (M62.81)                Time: [id number] OT Time Calculation (min): 22 min Charges:  OT General Charges $OT Visit: 1 Visit OT Evaluation $OT Eval Moderate Complexity: 1 Mod  Mills Mitton P, MS, OTR/L 08/21/2019, 4:53 PM

## 2019-08-21 NOTE — Progress Notes (Signed)
Initial Nutrition Assessment  DOCUMENTATION CODES:   Not applicable  INTERVENTION:  -Ensure Enlive po BID, each supplement provides 350 kcal and 20 grams of protein -Magic cup TID with meals, each supplement provides 290 kcal and 9 grams of protein -MVI with minerals   NUTRITION DIAGNOSIS:   Increased nutrient needs related to acute illness(sepsis due to [place]) as evidenced by estimated needs.  GOAL:   Patient will meet greater than or equal to 90% of their needs   MONITOR:   PO intake, Labs, I & O's, Supplement acceptance  REASON FOR ASSESSMENT:   Malnutrition Screening Tool    ASSESSMENT:  RD working remotely.  85 year old female with history of HTN, HLD, COPD, B12 deficiency, Merkel cell carcinoma, CKDIV, mild dementia, hypothyroidism who presented form independent living facility with complaints of cough with chest discomfort and left side chest soreness.  Patient admitted with sepsis due to pneumonia Unable to reach patient via phone this afternoon to obtain nutrition history. Per MD note patient reports not feeling well today and complains of headache, feels weak with intermittent cough. Patient reported poor historian.   No recorded meals at this time; suspect decreased intake over the past couple of days considering patient complaints of ongoing chest discomfort, headache, and weakness. RD will order Ensure and magic cup to assist with calorie and protein needs.   Current wt  61.2 kg (134.6 lb) No weight history for review  Medications reviewed and include: lovenox, mucinex,  Namenda, methylprednisolone, protonix, mirapex, vit B12, zithromax  Labs: WBC 12.4 Hemoglobin 9.7   NUTRITION - FOCUSED PHYSICAL EXAM: Unable to complete at this time   Diet Order:   Diet Order            Diet Heart Room service appropriate? Yes with Assist; Fluid consistency: Thin  Diet effective now              EDUCATION NEEDS:   No education needs have been  identified at this time  Skin:  Skin Assessment: Reviewed RN Assessment  Last BM:  9/1  Height:   Ht Readings from Last 1 Encounters:  08/20/19 4\' 11"  (1.499 m)    Weight:   Wt Readings from Last 1 Encounters:  08/20/19 61.2 kg    Ideal Body Weight:  44.3 kg  BMI:  Body mass index is 27.27 kg/m.  Estimated Nutritional Needs:   Kcal:  1300-1500  Protein:  65-75  Fluid:  1.3L/day   Lajuan Lines, RD, LDN Office [phone] After Hours/Weekend Pager: [phone]

## 2019-08-21 NOTE — Evaluation (Signed)
Physical Therapy Evaluation Patient Details Name: Lisa Cortez MRN: [id number] DOB: [birth year] Today's Date: 08/21/2019   History of Present Illness  Pt is a 85 y/o female admitted secondary to sepsis and hypoxic respiratory failure. Imaging suspicious for L lower lobe PNA. PMH includes CKD, dementia, HTN, COPD, and merkel cell carcinoma.   Clinical Impression  Pt admitted secondary to problem above with deficits below. Pt with very limited tolerance secondary to increased SOB and wheezing. Pt with DOE at 3/4 sitting EOB and pt requesting to lie back down. Oxygen sats at 94-05% on supplemental oxygen. Pt requiring min A to perform bed mobility tasks. Given current limitations, feel pt would benefit from short term SNF prior to return to [facility]. Will continue to follow acutely to maximize functional mobility independence and safety.     Follow Up Recommendations SNF    Equipment Recommendations  None recommended by PT    Recommendations for Other Services       Precautions / Restrictions Precautions Precautions: Fall Restrictions Weight Bearing Restrictions: No      Mobility  Bed Mobility Overal bed mobility: Needs Assistance Bed Mobility: Supine to Sit;Sit to Supine     Supine to sit: Min assist Sit to supine: Supervision   General bed mobility comments: Min A For trunk elevation to come to sitting. Pt with increased SOB and wheezing sitting at EOB. Oxygen sats from 95-96% on supplemental oxygen. Further mobility deferred. Required supervision for safety for return to supine.   Transfers                    Ambulation/Gait                Stairs            Wheelchair Mobility    Modified Rankin (Stroke Patients Only)       Balance Overall balance assessment: Needs assistance Sitting-balance support: No upper extremity supported;Feet supported Sitting balance-Leahy Scale: Fair                                       Pertinent  Vitals/Pain Pain Assessment: No/denies pain    Home Living Family/patient expects to be discharged to:: Private residence Living Arrangements: Alone Available Help at Discharge: Family;Available PRN/intermittently Type of Home: Independent living facility Home Access: Elevator     Home Layout: One level Home Equipment: [place] - 4 wheels;Cane - single point;Shower seat - built in Additional Comments: Pt reports living in independent living facility.     Prior Function Level of Independence: Independent with assistive device(s)         Comments: reports using rollator for ambulation.      Hand Dominance        Extremity/Trunk Assessment   Upper Extremity Assessment Upper Extremity Assessment: Defer to OT evaluation    Lower Extremity Assessment Lower Extremity Assessment: Generalized weakness    Cervical / Trunk Assessment Cervical / Trunk Assessment: Kyphotic  Communication   Communication: HOH  Cognition Arousal/Alertness: Awake/alert Behavior During Therapy: WFL for tasks assessed/performed Overall Cognitive Status: History of cognitive impairments - at baseline                                 General Comments: Per chart hx of dementia.       General Comments  Exercises     Assessment/Plan    PT Assessment Patient needs continued PT services  PT Problem List Cardiopulmonary status limiting activity;Decreased strength;Decreased balance;Decreased activity tolerance;Decreased mobility;Decreased cognition       PT Treatment Interventions DME instruction;Gait training;Functional mobility training;Therapeutic activities;Therapeutic exercise;Balance training;Patient/family education    PT Goals (Current goals can be found in the Care Plan section)  Acute Rehab PT Goals Patient Stated Goal: to be able to breathe better PT Goal Formulation: With patient Time For Goal Achievement: 09/04/19 Potential to Achieve Goals: Good    Frequency  Min 2X/week   Barriers to discharge Decreased caregiver support      Co-evaluation               AM-PAC PT "6 Clicks" Mobility  Outcome Measure Help needed turning from your back to your side while in a flat bed without using bedrails?: A Little Help needed moving from lying on your back to sitting on the side of a flat bed without using bedrails?: A Little Help needed moving to and from a bed to a chair (including a wheelchair)?: A Lot Help needed standing up from a chair using your arms (e.g., wheelchair or bedside chair)?: A Lot Help needed to walk in hospital room?: A Lot Help needed climbing 3-5 steps with a railing? : A Lot 6 Click Score: 14    End of Session   Activity Tolerance: Treatment limited secondary to medical complications (Comment)(SOB; wheezing) Patient left: in bed;with call bell/phone within reach;with bed alarm set Nurse Communication: Mobility status PT Visit Diagnosis: Other abnormalities of gait and mobility (R26.89);Muscle weakness (generalized) (M62.81);Difficulty in walking, not elsewhere classified (R26.2)    Time: [id number] PT Time Calculation (min) (ACUTE ONLY): 15 min   Charges:   PT Evaluation $PT Eval Moderate Complexity: Edgar, PT, DPT  Acute Rehabilitation Services  Pager: [phone] Office: [phone]   Rudean Hitt 08/21/2019, 1:01 PM

## 2019-08-21 NOTE — TOC Initial Note (Signed)
Transition of Care [facility]) - Initial/Assessment Note    Patient Details  Name: Lisa Cortez MRN: [id number] Date of Birth: 02/12/1925  Transition of [facility]) CM/SW Contact:    Benard Halsted, LCSW Phone Number: 08/21/2019, 3:32 PM  Clinical Narrative:                 CSW received consult regarding PT recommendation of SNF at discharge.  Patient is refusing SNF and states she would rather return to her independent living at [facility]. She states she is confined to her room and meals are delivered to her anyway due to [place]. She states she will be fine and since she is 80 she wants to use everyday the way she wants. CSW updated [name]'s admission liaison, Claiborne Billings.     Cedric Fishman LCSW [phone]    Expected Discharge Plan: Home/Self Care([place]) Barriers to Discharge: Continued Medical Work up   Patient Goals and CMS Choice Patient states their goals for this hospitalization and ongoing recovery are:: Return to her apartment CMS Medicare.gov Compare Post Acute Care list provided to:: Patient Choice offered to / list presented to : Patient  Expected Discharge Plan and Services Expected Discharge Plan: Home/Self Care([place]) In-house Referral: Clinical Social Work Discharge Planning Services: NA Post Acute Care Choice: NA Living arrangements for the past 2 months: [place]                                      Prior Living Arrangements/Services Living arrangements for the past 2 months: [place] Lives with:: Self Patient language and need for interpreter reviewed:: Yes Do you feel safe going back to the place where you live?: Yes      Need for Family Participation in Patient Care: No (Comment) Care giver support system in place?: Yes (comment) Current home services: DME Criminal Activity/Legal Involvement Pertinent to Current Situation/Hospitalization: No - Comment as needed  Activities of Daily Living Home Assistive  Devices/Equipment: [place]ford Rile (specify type) ADL Screening (condition at time of admission) Patient's cognitive ability adequate to safely complete daily activities?: Yes Is the patient deaf or have difficulty hearing?: Yes Does the patient have difficulty seeing, even when wearing glasses/contacts?: No Does the patient have difficulty concentrating, remembering, or making decisions?: No Patient able to express need for assistance with ADLs?: Yes Does the patient have difficulty dressing or bathing?: Yes Independently performs ADLs?: No Communication: Independent Dressing (OT): Needs assistance Is this a change from baseline?: Change from baseline, expected to last >3 days Grooming: Appropriate for developmental age Feeding: Independent Bathing: Needs assistance Is this a change from baseline?: Change from baseline, expected to last >3 days Toileting: Needs assistance Is this a change from baseline?: Change from baseline, expected to last >3days In/Out Bed: Needs assistance Is this a change from baseline?: Change from baseline, expected to last >3 days Walks in Home: Appropriate for developmental age Does the patient have difficulty walking or climbing stairs?: No Weakness of Legs: Both Weakness of Arms/Hands: Both  Permission Sought/Granted Permission sought to share information with : Facility Art therapist granted to share information with : Yes, Verbal Permission Granted     Permission granted to share info w AGENCY: [name]        Emotional Assessment Appearance:: Appears stated age Attitude/Demeanor/Rapport: Engaged Affect (typically observed): Accepting, Appropriate Orientation: : Oriented to Self, Oriented to Place, Oriented to  Time, Oriented to Situation Alcohol /  Substance Use: Not Applicable Psych Involvement: No (comment)  Admission diagnosis:  Acute respiratory failure with hypoxia (HCC) [J96.01] HCAP (healthcare-associated pneumonia)  [J18.9] Patient Active Problem List   Diagnosis Date Noted  . Sepsis ([place]) 08/20/2019  . Pneumonia 08/20/2019  . COPD with acute exacerbation ([place]) 08/20/2019  . Acute on chronic respiratory failure with hypoxia ([place]) 08/20/2019  . Chest pain 08/20/2019  . Nausea and vomiting 08/20/2019  . Macrocytic anemia 08/20/2019  . Hypertension 08/20/2019  . 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

## 2019-08-21 NOTE — Evaluation (Signed)
Clinical/Bedside Swallow Evaluation Patient Details  Name: Carmeleta Piela MRN: [id number] Date of Birth: 1925/11/09  Today's Date: 08/21/2019 Time: SLP Start Time (ACUTE ONLY): [id number] SLP Stop Time (ACUTE ONLY): 1520 SLP Time Calculation (min) (ACUTE ONLY): 35 min  Past Medical History:  Past Medical History:  Diagnosis Date  . Chronic kidney disease (CKD), stage IV (severe) ([place])   . Hyperlipidemia   . Hypertension   . Hypothyroidism   . 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 BREAST REDUCTION SURGERY    . CHOLECYSTECTOMY     HPI:  85 year old female with history of HTN, HLD, COPD, B12 deficiency, Merkel cell carcinoma, CKDIV, mild dementia, hypothyroidism who presented form independent living facility with complaints of cough with chest discomfort and left side chest soreness. CXR = Hazy and patchy opacity at the left lung base, which could reflect atelectasis and/or infiltrate.   Assessment / Plan / Recommendation Clinical Impression  Suspect primary esophageal dysphagia. Pt presents with adequate dentition and oral motor strength and function. CN exam unremarkable. Pt completed oral care after set up. Pt reports significant history of esophageal issues. Per pt, It has been several years since her last esophageal dilation, and that "it might be time again". Pt exhibits adequate oral prep and clearing with no overt s/s aspiration on any consistency. Pt passed [place] without difficulty. Pt had no difficulty with cracker, however, she reports intermittent difficulty with solids. Pt was provided with strategies for management of esophageal dysmotility, most of which she already knows. Recommend consideration of GI consult for possible dilation if within pt/family wishes. Pt is open to having this procedure done. Her daughter would like information on the risks associated with it. SLP discussed  risks associated with sedation, and risk of infection or injury. Will defer additional discussion regarding risks and benefits to MD. SLP will follow for assessment of diet tolerance and education. Thank you for this referral!   SLP Visit Diagnosis: Dysphagia, unspecified (R13.10)    Aspiration Risk  Mild aspiration risk    Diet Recommendation Regular;Thin liquid   Liquid Administration via: Cup;Straw Medication Administration: Whole meds with liquid Supervision: Patient able to self feed Compensations: Small sips/bites;Slow rate;Follow solids with liquid Postural Changes: Seated upright at 90 degrees;Remain upright for at least 30 minutes after po intake    Other  Recommendations Recommended Consults: Consider GI evaluation Oral Care Recommendations: Oral care BID   Follow up Recommendations None      Frequency and Duration min 1 x/week  1 week       Prognosis Prognosis for Safe Diet Advancement: Good      Swallow Study   General Date of Onset: 08/20/19 HPI: 85 year old female with history of HTN, HLD, COPD, B12 deficiency, Merkel cell carcinoma, CKDIV, mild dementia, hypothyroidism who presented form independent living facility with complaints of cough with chest discomfort and left side chest soreness. CXR = Hazy and patchy opacity at the left lung base, which could reflect atelectasis and/or infiltrate. Type of Study: Bedside Swallow Evaluation Previous Swallow Assessment: no prior ST intervention found Diet Prior to this Study: Regular;Thin liquids(heart healthy) Temperature Spikes Noted: No Respiratory Status: Nasal cannula History of Recent Intubation: No Behavior/Cognition: Alert;Cooperative;Pleasant mood Oral Cavity - Dentition: Adequate natural dentition Vision: Functional for self-feeding Self-Feeding Abilities: Able to feed self Patient Positioning: Upright in bed Baseline Vocal Quality: Normal Volitional Cough: Strong Volitional  Swallow: Able to elicit     Oral/Motor/Sensory Function Overall Oral Motor/Sensory Function: Within functional limits   Ice Chips Ice chips: Not tested   Thin Liquid Thin Liquid: Within functional limits Presentation: Straw    Nectar Thick Nectar Thick Liquid: Not tested   Honey Thick Honey Thick Liquid: Not tested   Puree Puree: Within functional limits Presentation: Self Fed;Spoon   Solid     Solid: Within functional limits Presentation: [place] [facility] [place], [facility], Ali [place] Speech Language Pathologist [phone]  Shonna Chock 08/21/2019,3:39 PM

## 2019-08-21 NOTE — CV Procedure (Addendum)
Echocardiogram not completed due to other therapies being performed. Attempted at 2:50 pm and speech was evaluating. Attempted again at 3:35 and physical therapy is working with her.  Darlina Sicilian RDCS

## 2019-08-21 NOTE — Progress Notes (Signed)
Patient ID: Lisa Cortez, female   DOB: 01-29-1925, 85 y.o.   MRN: [id number]  PROGRESS NOTE    Lisa Cortez  [id number] DOB: 06-25-1925 DOA: 08/20/2019 PCP: Patient, No Pcp Per   Brief Narrative:  85 year old female with history of hypertension, hyperlipidemia, COPD, vitamin B12 deficiency, Merkel cell carcinoma, chronic kidney disease stage IV, mild dementia and hypothyroidism presented on 08/20/2019 with cough and chest discomfort.  She was found to be febrile up to 101.3 with tachypnea, hypoxia, leukocytosis.  COVID-19 test was negative.  Chest x-ray revealed hazy and patchy opacity of the left lung base.  She was started on broad-spectrum antibiotics.  Assessment & Plan:   Sepsis: Present on admission Probable persistent community-acquired bacterial, I would consider pneumonia. Acute hypoxic respiratory failure -Chest x-ray on presentation showed possible left lower lobe hazy opacity concerning for pneumonia -Currently on cefepime, Zithromax and vancomycin.  We will switch to Rocephin and Zithromax and DC cefepime and vancomycin. -Follow cultures.  COVID-19 initial testing negative -Patient was apparently on nonrebreather on presentation but currently on 3.5 L oxygen via nasal cannula.  Wean off as able.  COPD with possible exacerbation -Continue neb treatments.  We will switch prednisone to Solu-Medrol 40 mg IV every 8 hours.  Continue Singulair.  Incentive spirometry.  Chest pain -Most likely from above.  Troponins x2.  EKG showed some ST depressions in the lateral leads.  Will get 2D echo. -Currently denies any chest pain.  Nausea and vomiting -Probably from pneumonia.  Monitor.  Aspiration precautions.  Antiemetics as needed  Anemia of chronic disease -Probably from renal disease.  Hemoglobin stable.  Monitor  Chronic kidney disease stage IV with metabolic acidosis -Creatinine 1.8-2 at baseline. -Creatinine stable..  Continue sodium bicarbonate.  Hyperlipidemia-continue  Lipitor  Essential hypertension: Blood pressure stable.  Continue metoprolol and amlodipine.  Mild dementia -Continue Namenda.  Fall precautions.   GERD -Continue Protonix  Generalized conditioning--PT eval.  Patient was living in independent living facility prior to presentation.  DVT prophylaxis: Lovenox Code Status: DNR  family Communication: called Debra/daughter on phone on 08/21/2019 but she didn't pick up Disposition Plan:discharge in 1-3 days if clinically improved. Might need SNF.  Consultants: None  Procedures: None  Antimicrobials:  Anti-infectives (From admission, onward)   Start     Dose/Rate Route Frequency Ordered Stop   08/22/19 1000  vancomycin (VANCOCIN) IVPB 750 mg/150 ml premix  Status:  Discontinued     750 mg 150 mL/hr over 60 Minutes Intravenous Every 48 hours 08/20/19 0837 08/21/19 1038   08/21/19 1045  cefTRIAXone (ROCEPHIN) 1 g in sodium chloride 0.9 % 100 mL IVPB     1 g 200 mL/hr over 30 Minutes Intravenous Every 24 hours 08/21/19 1039     08/21/19 1000  ceFEPIme (MAXIPIME) 2 g in sodium chloride 0.9 % 100 mL IVPB  Status:  Discontinued     2 g 200 mL/hr over 30 Minutes Intravenous Every 24 hours 08/20/19 2211 08/21/19 1038   08/20/19 0900  azithromycin (ZITHROMAX) 500 mg in sodium chloride 0.9 % 250 mL IVPB     500 mg 250 mL/hr over 60 Minutes Intravenous Every 24 hours 08/20/19 0855     08/20/19 0845  vancomycin (VANCOCIN) IVPB 1000 mg/200 mL premix     1,000 mg 200 mL/hr over 60 Minutes Intravenous  Once 08/20/19 0833 08/20/19 1420   08/20/19 0500  ceFEPIme (MAXIPIME) 2 g in sodium chloride 0.9 % 100 mL IVPB     2 g 200 mL/hr  over 30 Minutes Intravenous  Once 08/20/19 0456 08/20/19 0855       Subjective: Patient seen and examined at bedside.  She does not feel well and complains of headache.  Awake but poor historian.  Feels very weak with intermittent cough.  Objective: Vitals:   08/20/19 2213 08/21/19 0543 08/21/19 0739 08/21/19 0800   BP: 139/75 (!) 147/67  140/74  Pulse: 88 76  80  Resp: 19 19    Temp: 98.5 F (36.9 C) 98.2 F (36.8 C)  98.8 F (37.1 C)  TempSrc: Oral Oral  Oral  SpO2: 92% 94% 93% 96%  Weight:      Height:        Intake/Output Summary (Last 24 hours) at 08/21/2019 1030 Last data filed at 08/21/2019 0949 Gross per 24 hour  Intake 253 ml  Output 1500 ml  Net -1247 ml   Filed Weights   08/20/19 0815  Weight: 61.2 kg    Examination:  General exam: Elderly female lying in bed.  No distress.  Poor historian. Respiratory system: Bilateral decreased breath sounds at bases with scattered crackles Cardiovascular system: S1 & S2 heard, Rate controlled Gastrointestinal system: Abdomen is nondistended, soft and nontender. Normal bowel sounds heard. Extremities: No cyanosis, clubbing; trace edema Central nervous system: Awake, answers some questions, slightly confused. No focal neurological deficits. Moving extremities Skin: No rashes, lesions or ulcers Psychiatry: Looks very anxious.    Data Reviewed: I have personally reviewed following labs and imaging studies  CBC: Recent Labs  Lab 08/20/19 0524 08/21/19 0152  WBC 12.9* 12.4*  NEUTROABS 10.8*  --   HGB 11.2* 9.7*  HCT 35.8* 29.7*  MCV 100.3* 96.4  PLT 213 [id number]   Basic Metabolic Panel: Recent Labs  Lab 08/20/19 0524 08/21/19 0152  NA 139 138  K 4.3 4.8  CL 108 112*  CO2 19* 18*  GLUCOSE 118* 122*  BUN 30* 29*  CREATININE 1.85* 1.52*  CALCIUM 8.8* 8.5*   GFR: Estimated Creatinine Clearance: 18.8 mL/min (A) (by C-G formula based on SCr of 1.52 mg/dL (H)). Liver Function Tests: Recent Labs  Lab 08/20/19 0524  AST 63*  ALT 40  ALKPHOS 186*  BILITOT 0.8  PROT 7.4  ALBUMIN 3.3*   No results for input(s): LIPASE, AMYLASE in the last 168 hours. No results for input(s): AMMONIA in the last 168 hours. Coagulation Profile: No results for input(s): INR, PROTIME in the last 168 hours. Cardiac Enzymes: No results for  input(s): CKTOTAL, CKMB, CKMBINDEX, TROPONINI in the last 168 hours. BNP (last 3 results) No results for input(s): PROBNP in the last 8760 hours. HbA1C: No results for input(s): HGBA1C in the last 72 hours. CBG: No results for input(s): GLUCAP in the last 168 hours. Lipid Profile: No results for input(s): CHOL, HDL, LDLCALC, TRIG, CHOLHDL, LDLDIRECT in the last 72 hours. Thyroid Function Tests: No results for input(s): TSH, T4TOTAL, FREET4, T3FREE, THYROIDAB in the last 72 hours. Anemia Panel: No results for input(s): VITAMINB12, FOLATE, FERRITIN, TIBC, IRON, RETICCTPCT in the last 72 hours. Sepsis Labs: Recent Labs  Lab 08/20/19 0539 08/20/19 1226  LATICACIDVEN 2.0* 2.7*    Recent Results (from the past 240 hour(s))  Blood culture (routine x 2)     Status: None (Preliminary result)   Collection Time: 08/20/19  4:43 AM   Specimen: BLOOD RIGHT HAND  Result Value Ref Range Status   Specimen Description BLOOD RIGHT HAND  Final   Special Requests   Final    BOTTLES  DRAWN AEROBIC ONLY Blood Culture results may not be optimal due to an inadequate volume of blood received in culture bottles   Culture   Final    NO GROWTH 1 DAY Performed at [facility] [address] [place], [place] [address]    Report Status PENDING  Incomplete  Blood culture (routine x 2)     Status: None (Preliminary result)   Collection Time: 08/20/19  5:30 AM   Specimen: BLOOD  Result Value Ref Range Status   Specimen Description BLOOD RIGHT THUMB  Final   Special Requests   Final    BOTTLES DRAWN AEROBIC ONLY Blood Culture results may not be optimal due to an inadequate volume of blood received in culture bottles   Culture   Final    NO GROWTH 1 DAY Performed at [facility], [place] [address], [place],  [address]    Report Status PENDING  Incomplete  SARS Coronavirus 2 [facility] order, Performed in [facility] hospital lab) Nasopharyngeal Nasopharyngeal Swab     Status: None    Collection Time: 08/20/19  6:05 AM   Specimen: Nasopharyngeal Swab  Result Value Ref Range Status   SARS Coronavirus 2 NEGATIVE NEGATIVE Final    Comment: (NOTE) If result is NEGATIVE SARS-CoV-2 target nucleic acids are NOT DETECTED. The SARS-CoV-2 RNA is generally detectable in upper and lower  respiratory specimens during the acute phase of infection. The lowest  concentration of SARS-CoV-2 viral copies this assay can detect is 250  copies / mL. A negative result does not preclude SARS-CoV-2 infection  and should not be used as the sole basis for treatment or other  patient management decisions.  A negative result may occur with  improper specimen collection / handling, submission of specimen other  than nasopharyngeal swab, presence of viral mutation(s) within the  areas targeted by this assay, and inadequate number of viral copies  (<250 copies / mL). A negative result must be combined with clinical  observations, patient history, and epidemiological information. If result is POSITIVE SARS-CoV-2 target nucleic acids are DETECTED. The SARS-CoV-2 RNA is generally detectable in upper and lower  respiratory specimens dur ing the acute phase of infection.  Positive  results are indicative of active infection with SARS-CoV-2.  Clinical  correlation with patient history and other diagnostic information is  necessary to determine patient infection status.  Positive results do  not rule out bacterial infection or co-infection with other viruses. If result is PRESUMPTIVE POSTIVE SARS-CoV-2 nucleic acids MAY BE PRESENT.   A presumptive positive result was obtained on the submitted specimen  and confirmed on repeat testing.  While 2019 novel coronavirus  (SARS-CoV-2) nucleic acids may be present in the submitted sample  additional confirmatory testing may be necessary for epidemiological  and / or clinical management purposes  to differentiate between  SARS-CoV-2 and other Sarbecovirus  currently known to infect humans.  If clinically indicated additional testing with an alternate test  methodology [phone]) is advised. The SARS-CoV-2 RNA is generally  detectable in upper and lower respiratory sp ecimens during the acute  phase of infection. The expected result is Negative. Fact Sheet for Patients:  StrictlyIdeas.no Fact Sheet for Healthcare Providers: BankingDealers.co.za This test is not yet approved or cleared by the [place] FDA and has been authorized for detection and/or diagnosis of SARS-CoV-2 by FDA under an Emergency Use Authorization (EUA).  This EUA will remain in effect (meaning this test can be used) for the duration  of the COVID-19 declaration under Section 564(b)(1) of the Act, 21 U.S.C. section 360bbb-3(b)(1), unless the authorization is terminated or revoked sooner. Performed at [facility], [place] [address], [place], [place] [address]   Culture, sputum-assessment     Status: None   Collection Time: 08/20/19  7:54 AM   Specimen: Sputum  Result Value Ref Range Status   Specimen Description SPUTUM  Final   Special Requests NONE  Final   Sputum evaluation   Final    THIS SPECIMEN IS ACCEPTABLE FOR SPUTUM CULTURE Performed at [facility], [address], [place], [place] [address]    Report Status 08/21/2019 FINAL  Final  Culture, respiratory     Status: None (Preliminary result)   Collection Time: 08/20/19  7:54 AM   Specimen: SPU  Result Value Ref Range Status   Specimen Description SPUTUM  Final   Special Requests NONE Reflexed from [id number]  Final   Gram Stain   Final    MODERATE WBC PRESENT,BOTH PMN AND MONONUCLEAR FEW GRAM POSITIVE COCCI IN PAIRS FEW GRAM POSITIVE RODS RARE BUDDING YEAST SEEN Performed at [facility], [place] [address], [place], [place] [address]    Culture PENDING  Incomplete   Report Status PENDING  Incomplete  MRSA PCR Screening     Status: None    Collection Time: 08/20/19  1:12 PM   Specimen: Nasopharyngeal  Result Value Ref Range Status   MRSA by PCR NEGATIVE NEGATIVE Final    Comment:        The GeneXpert MRSA Assay (FDA approved for NASAL specimens only), is one component of a comprehensive MRSA colonization surveillance program. It is not intended to diagnose MRSA infection nor to guide or monitor treatment for MRSA infections. Performed at [facility], [place] [address], [place],  [address]          Radiology Studies: Dg Chest Portable 1 View  Result Date: 08/20/2019 CLINICAL DATA:  Initial evaluation for acute chest pain, cough, chills. EXAM: PORTABLE CHEST 1 VIEW COMPARISON:  None available. FINDINGS: Cardiomegaly. Mediastinal silhouette within normal limits. Aortic atherosclerosis. Lungs mildly hypoinflated. Hazy and patchy opacity at the left lung base, which could reflect atelectasis and/or infiltrate. Mild scattered right basilar subsegmental atelectasis. Perihilar vascular congestion without pulmonary edema. No visible pleural effusion. No pneumothorax. No acute osseous finding. IMPRESSION: 1. Hazy and patchy opacity at the left lung base, which could reflect atelectasis and/or infiltrate. 2. Cardiomegaly with perihilar vascular congestion without overt pulmonary edema. 3. Aortic atherosclerosis. Electronically Signed   By: Jeannine Boga M.D.   On: 08/20/2019 04:45        Scheduled Meds: . albuterol  2.5 mg Nebulization BID  . amLODipine  5 mg Oral QHS  . arformoterol  15 mcg Nebulization BID  . aspirin EC  81 mg Oral Daily  . atorvastatin  10 mg Oral QHS  . budesonide (PULMICORT) nebulizer solution  0.5 mg Nebulization BID  . enoxaparin (LOVENOX) injection  30 mg Subcutaneous Daily  . feeding supplement (ENSURE ENLIVE)  237 mL Oral BID BM  . guaiFENesin  600 mg Oral BID  . levothyroxine  75 mcg Oral QAC breakfast  . memantine  5 mg Oral QHS  . metoprolol tartrate  25 mg Oral BID  .  montelukast  10 mg Oral Daily  . pantoprazole  40 mg Oral Daily  . pramipexole  0.25 mg Oral QHS  . predniSONE  40 mg Oral Q breakfast  .  sodium bicarbonate  650 mg Oral BID  . sodium chloride flush  3 mL Intravenous Q12H  . traZODone  50 mg Oral Once  . vitamin B-12  1,000 mcg Oral Daily   Continuous Infusions: . azithromycin 500 mg (08/21/19 0845)  . ceFEPime (MAXIPIME) IV    . [START ON 08/22/2019] vancomycin       LOS: 1 day        Aline August, MD [facility] 08/21/2019, 10:30 AM

## 2019-08-22 ENCOUNTER — Inpatient Hospital Stay (HOSPITAL_COMMUNITY): Payer: Medicare HMO

## 2019-08-22 DIAGNOSIS — I340 Nonrheumatic mitral (valve) insufficiency: Secondary | ICD-10-CM

## 2019-08-22 LAB — BASIC METABOLIC PANEL
Anion gap: 13 (ref 5–15)
BUN: 37 mg/dL — ABNORMAL HIGH (ref 8–23)
CO2: 19 mmol/L — ABNORMAL LOW (ref 22–32)
Calcium: 9.2 mg/dL (ref 8.9–10.3)
Chloride: 108 mmol/L (ref 98–111)
Creatinine, Ser: 1.47 mg/dL — ABNORMAL HIGH (ref 0.44–1.00)
GFR calc Af Amer: 36 mL/min — ABNORMAL LOW (ref >60)
GFR calc non Af Amer: 31 mL/min — ABNORMAL LOW (ref >60)
Glucose, Bld: 107 mg/dL — ABNORMAL HIGH (ref 70–99)
Potassium: 5.8 mmol/L — ABNORMAL HIGH (ref 3.5–5.1)
Sodium: 140 mmol/L (ref 135–145)

## 2019-08-22 LAB — LACTIC ACID, PLASMA
Lactic Acid, Venous: 3.5 mmol/L (ref 0.5–1.9)
Lactic Acid, Venous: 3.1 mmol/L (ref 0.5–1.9)
Lactic Acid, Venous: 2.1 mmol/L (ref 0.5–1.9)

## 2019-08-22 LAB — CBC WITH DIFFERENTIAL/PLATELET
Abs Immature Granulocytes: 0.06 10*3/uL (ref 0.00–0.07)
Basophils Absolute: 0 10*3/uL (ref 0.0–0.1)
Basophils Relative: 0 %
Eosinophils Absolute: 0 10*3/uL (ref 0.0–0.5)
Eosinophils Relative: 0 %
HCT: 35.5 % — ABNORMAL LOW (ref 36.0–46.0)
Hemoglobin: 11 g/dL — ABNORMAL LOW (ref 12.0–15.0)
Immature Granulocytes: 1 %
Lymphocytes Relative: 9 %
Lymphs Abs: 0.9 10*3/uL (ref 0.7–4.0)
MCH: 31.3 pg (ref 26.0–34.0)
MCHC: 31 g/dL (ref 30.0–36.0)
MCV: 101.1 fL — ABNORMAL HIGH (ref 80.0–100.0)
Monocytes Absolute: 0.5 10*3/uL (ref 0.1–1.0)
Monocytes Relative: 5 %
Neutro Abs: 8.2 10*3/uL — ABNORMAL HIGH (ref 1.7–7.7)
Neutrophils Relative %: 85 %
Platelets: 224 10*3/uL (ref 150–400)
RBC: 3.51 MIL/uL — ABNORMAL LOW (ref 3.87–5.11)
RDW: 13.9 % (ref 11.5–15.5)
WBC: 9.7 10*3/uL (ref 4.0–10.5)
nRBC: 0 % (ref 0.0–0.2)

## 2019-08-22 LAB — IRON AND TIBC
Iron: 34 ug/dL (ref 28–170)
Saturation Ratios: 9 % — ABNORMAL LOW (ref 10.4–31.8)
TIBC: 363 ug/dL (ref 250–450)
UIBC: 329 ug/dL

## 2019-08-22 LAB — VITAMIN B12: Vitamin B-12: 2438 pg/mL — ABNORMAL HIGH (ref 180–914)

## 2019-08-22 LAB — FERRITIN: Ferritin: 45 ng/mL (ref 11–307)

## 2019-08-22 LAB — ECHOCARDIOGRAM COMPLETE
Height: 59 in
Weight: 2160 oz

## 2019-08-22 LAB — FOLATE: Folate: 14.7 ng/mL (ref >5.9)

## 2019-08-22 MED ORDER — SODIUM CHLORIDE 0.9 % IV SOLN
INTRAVENOUS | Status: DC
  Administered 2019-08-22 (×2): via INTRAVENOUS

## 2019-08-22 MED ORDER — LIP MEDEX EX OINT
TOPICAL_OINTMENT | CUTANEOUS | Status: DC | PRN
  Filled 2019-08-22: qty 7

## 2019-08-22 MED ORDER — SODIUM BICARBONATE 650 MG PO TABS
650.0000 mg | ORAL_TABLET | Freq: Three times a day (TID) | ORAL | Status: DC
  Administered 2019-08-22 – 2019-08-24 (×6): 650 mg via ORAL
  Filled 2019-08-22 (×6): qty 1

## 2019-08-22 NOTE — Progress Notes (Signed)
Patient ID: Lisa Cortez, female   DOB: [birth year], 85 y.o.   MRN: [id number]  PROGRESS NOTE    Lisa Cortez  [id number] DOB: 01/27/1925 DOA: 08/20/2019 PCP: Patient, No Pcp Per   Brief Narrative:  85 year old female with history of hypertension, hyperlipidemia, COPD, vitamin B12 deficiency, Merkel cell carcinoma, chronic kidney disease stage IV, mild dementia and hypothyroidism presented on 08/20/2019 with cough and chest discomfort.  She was found to be febrile up to 101.3 with tachypnea, hypoxia, leukocytosis.  COVID-19 test was negative.  Chest x-ray revealed hazy and patchy opacity of the left lung base.  She was started on broad-spectrum antibiotics.  Assessment & Plan:   Sepsis likely 2/2 CAP Present on admission Currently afebrile, with leukocytosis BC x2 NGTD Lactic acidosis, will trend LA Sputum culture pending Urine strep pneumo, Legionella both negative Continue azithromycin, ceftriaxone  Acute hypoxic respiratory failure Likely 2/2 as above, COPD exacerbation Chest x-ray on presentation showed possible left lower lobe hazy opacity concerning for pneumonia COVID-19 negative Continue Rocephin and Zithromax and DC Continue supplemental oxygen PRN Further management as above  COPD with possible exacerbation Continue neb treatments Continue Solu-Medrol, Singulair, incentive spirometry.  Chest pain Currently chest pain-free Troponins x2 neg EKG showed some ST depressions in the lateral leads 2D echo shows EF of 60 to 65%, left ventricular diastolic Doppler parameters are consistent with impaired relaxation  Anemia of chronic renal disease No signs of obvious bleeding Baseline hemoglobin around 11 Anemia panel pending Daily CBC  Chronic kidney disease stage IV with metabolic acidosis Creatinine 1.8-2 at baseline. Increased sodium bicarbonate Daily BMP  Hyperlipidemia Continue Lipitor  Essential hypertension Blood pressure stable Continue metoprolol and  amlodipine.  Dementia Continue Namenda  GERD Continue Protonix  Generalized conditioning PT eval.  Patient was living in independent living facility prior to presentation, refusing SNF    DVT prophylaxis: Lovenox Code Status: DNR  Family Communication: None at bedside  Disposition Plan: Back to previous independent living, patient refusing SNF  Consultants: None  Procedures: None  Antimicrobials:  Anti-infectives (From admission, onward)   Start     Dose/Rate Route Frequency Ordered Stop   08/22/19 1000  vancomycin (VANCOCIN) IVPB 750 mg/150 ml premix  Status:  Discontinued     750 mg 150 mL/hr over 60 Minutes Intravenous Every 48 hours 08/20/19 0837 08/21/19 1038   08/21/19 1045  cefTRIAXone (ROCEPHIN) 1 g in sodium chloride 0.9 % 100 mL IVPB     1 g 200 mL/hr over 30 Minutes Intravenous Every 24 hours 08/21/19 1039     08/21/19 1000  ceFEPIme (MAXIPIME) 2 g in sodium chloride 0.9 % 100 mL IVPB  Status:  Discontinued     2 g 200 mL/hr over 30 Minutes Intravenous Every 24 hours 08/20/19 2211 08/21/19 1038   08/20/19 0900  azithromycin (ZITHROMAX) 500 mg in sodium chloride 0.9 % 250 mL IVPB     500 mg 250 mL/hr over 60 Minutes Intravenous Every 24 hours 08/20/19 0855     08/20/19 0845  vancomycin (VANCOCIN) IVPB 1000 mg/200 mL premix     1,000 mg 200 mL/hr over 60 Minutes Intravenous  Once 08/20/19 0833 08/20/19 1420   08/20/19 0500  ceFEPIme (MAXIPIME) 2 g in sodium chloride 0.9 % 100 mL IVPB     2 g 200 mL/hr over 30 Minutes Intravenous  Once 08/20/19 0456 08/20/19 0855       Subjective: Patient seen and examined at bedside, reports feeling somewhat better today.  Denies any new  complaints.  Objective: Vitals:   08/22/19 0110 08/22/19 0639 08/22/19 0748 08/22/19 1313  BP:  (!) 148/74  (!) 142/66  Pulse:  78  67  Resp:  18  16  Temp:  (!) 97.4 F (36.3 C)  (!) 97.4 F (36.3 C)  TempSrc:      SpO2: 94% 95% 92% 95%  Weight:      Height:         Intake/Output Summary (Last 24 hours) at 08/22/2019 1421 Last data filed at 08/22/2019 [id number] Gross per 24 hour  Intake 1983.17 ml  Output 1350 ml  Net 633.17 ml   Filed Weights   08/20/19 0815  Weight: 61.2 kg    Examination:  General: NAD, hard of hearing  Cardiovascular: S1, S2 present  Respiratory:  Bilateral diminished breath sounds at the bases, with bilateral rhonchi noted  Abdomen: Soft, nontender, nondistended, bowel sounds present  Musculoskeletal: No bilateral pedal edema noted  Skin: Normal  Psychiatry: Normal mood    Data Reviewed: I have personally reviewed following labs and imaging studies  CBC: Recent Labs  Lab 08/20/19 0524 08/21/19 0152  WBC 12.9* 12.4*  NEUTROABS 10.8*  --   HGB 11.2* 9.7*  HCT 35.8* 29.7*  MCV 100.3* 96.4  PLT 213 [id number]   Basic Metabolic Panel: Recent Labs  Lab 08/20/19 0524 08/21/19 0152  NA 139 138  K 4.3 4.8  CL 108 112*  CO2 19* 18*  GLUCOSE 118* 122*  BUN 30* 29*  CREATININE 1.85* 1.52*  CALCIUM 8.8* 8.5*   GFR: Estimated Creatinine Clearance: 18.8 mL/min (A) (by C-G formula based on SCr of 1.52 mg/dL (H)). Liver Function Tests: Recent Labs  Lab 08/20/19 0524  AST 63*  ALT 40  ALKPHOS 186*  BILITOT 0.8  PROT 7.4  ALBUMIN 3.3*   No results for input(s): LIPASE, AMYLASE in the last 168 hours. No results for input(s): AMMONIA in the last 168 hours. Coagulation Profile: No results for input(s): INR, PROTIME in the last 168 hours. Cardiac Enzymes: No results for input(s): CKTOTAL, CKMB, CKMBINDEX, TROPONINI in the last 168 hours. BNP (last 3 results) No results for input(s): PROBNP in the last 8760 hours. HbA1C: No results for input(s): HGBA1C in the last 72 hours. CBG: No results for input(s): GLUCAP in the last 168 hours. Lipid Profile: No results for input(s): CHOL, HDL, LDLCALC, TRIG, CHOLHDL, LDLDIRECT in the last 72 hours. Thyroid Function Tests: No results for input(s): TSH, T4TOTAL, FREET4,  T3FREE, THYROIDAB in the last 72 hours. Anemia Panel: No results for input(s): VITAMINB12, FOLATE, FERRITIN, TIBC, IRON, RETICCTPCT in the last 72 hours. Sepsis Labs: Recent Labs  Lab 08/20/19 0539 08/20/19 1226  LATICACIDVEN 2.0* 2.7*    Recent Results (from the past 240 hour(s))  Blood culture (routine x 2)     Status: None (Preliminary result)   Collection Time: 08/20/19  4:43 AM   Specimen: BLOOD RIGHT HAND  Result Value Ref Range Status   Specimen Description BLOOD RIGHT HAND  Final   Special Requests   Final    BOTTLES DRAWN AEROBIC ONLY Blood Culture results may not be optimal due to an inadequate volume of blood received in culture bottles   Culture   Final    NO GROWTH 2 DAYS Performed at [facility] [address] [place], [place] [address]    Report Status PENDING  Incomplete  Blood culture (routine x 2)     Status: None (Preliminary result)   Collection Time:  08/20/19  5:30 AM   Specimen: BLOOD  Result Value Ref Range Status   Specimen Description BLOOD RIGHT THUMB  Final   Special Requests   Final    BOTTLES DRAWN AEROBIC ONLY Blood Culture results may not be optimal due to an inadequate volume of blood received in culture bottles   Culture   Final    NO GROWTH 2 DAYS Performed at [facility], [place] [address], [place], [place] [address]    Report Status PENDING  Incomplete  SARS Coronavirus 2 [facility] order, Performed in [facility] hospital lab) Nasopharyngeal Nasopharyngeal Swab     Status: None   Collection Time: 08/20/19  6:05 AM   Specimen: Nasopharyngeal Swab  Result Value Ref Range Status   SARS Coronavirus 2 NEGATIVE NEGATIVE Final    Comment: (NOTE) If result is NEGATIVE SARS-CoV-2 target nucleic acids are NOT DETECTED. The SARS-CoV-2 RNA is generally detectable in upper and lower  respiratory specimens during the acute phase of infection. The lowest  concentration of SARS-CoV-2 viral copies this assay can detect is 250  copies /  mL. A negative result does not preclude SARS-CoV-2 infection  and should not be used as the sole basis for treatment or other  patient management decisions.  A negative result may occur with  improper specimen collection / handling, submission of specimen other  than nasopharyngeal swab, presence of viral mutation(s) within the  areas targeted by this assay, and inadequate number of viral copies  (<250 copies / mL). A negative result must be combined with clinical  observations, patient history, and epidemiological information. If result is POSITIVE SARS-CoV-2 target nucleic acids are DETECTED. The SARS-CoV-2 RNA is generally detectable in upper and lower  respiratory specimens dur ing the acute phase of infection.  Positive  results are indicative of active infection with SARS-CoV-2.  Clinical  correlation with patient history and other diagnostic information is  necessary to determine patient infection status.  Positive results do  not rule out bacterial infection or co-infection with other viruses. If result is PRESUMPTIVE POSTIVE SARS-CoV-2 nucleic acids MAY BE PRESENT.   A presumptive positive result was obtained on the submitted specimen  and confirmed on repeat testing.  While 2019 novel coronavirus  (SARS-CoV-2) nucleic acids may be present in the submitted sample  additional confirmatory testing may be necessary for epidemiological  and / or clinical management purposes  to differentiate between  SARS-CoV-2 and other Sarbecovirus currently known to infect humans.  If clinically indicated additional testing with an alternate test  methodology [phone]) is advised. The SARS-CoV-2 RNA is generally  detectable in upper and lower respiratory sp ecimens during the acute  phase of infection. The expected result is Negative. Fact Sheet for Patients:  StrictlyIdeas.no Fact Sheet for Healthcare Providers: BankingDealers.co.za This test is  not yet approved or cleared by the [place] FDA and has been authorized for detection and/or diagnosis of SARS-CoV-2 by FDA under an Emergency Use Authorization (EUA).  This EUA will remain in effect (meaning this test can be used) for the duration of the COVID-19 declaration under Section 564(b)(1) of the Act, 21 U.S.C. section 360bbb-3(b)(1), unless the authorization is terminated or revoked sooner. Performed at [facility], [place] [address], [place], [place] [address]   Culture, sputum-assessment     Status: None   Collection Time: 08/20/19  7:54 AM   Specimen: Sputum  Result Value Ref Range Status   Specimen Description SPUTUM  Final   Special  Requests NONE  Final   Sputum evaluation   Final    THIS SPECIMEN IS ACCEPTABLE FOR SPUTUM CULTURE Performed at [facility], [place] [address] [place], [place] [address]    Report Status 08/21/2019 FINAL  Final  Culture, respiratory     Status: None (Preliminary result)   Collection Time: 08/20/19  7:54 AM   Specimen: SPU  Result Value Ref Range Status   Specimen Description SPUTUM  Final   Special Requests NONE Reflexed from [id number]  Final   Gram Stain   Final    MODERATE WBC PRESENT,BOTH PMN AND MONONUCLEAR FEW GRAM POSITIVE COCCI IN PAIRS FEW GRAM POSITIVE RODS RARE BUDDING YEAST SEEN    Culture   Final    CULTURE REINCUBATED FOR BETTER GROWTH Performed at [facility], [place] [address] [place], [place] [address]    Report Status PENDING  Incomplete  MRSA PCR Screening     Status: None   Collection Time: 08/20/19  1:12 PM   Specimen: Nasopharyngeal  Result Value Ref Range Status   MRSA by PCR NEGATIVE NEGATIVE Final    Comment:        The GeneXpert MRSA Assay (FDA approved for NASAL specimens only), is one component of a comprehensive MRSA colonization surveillance program. It is not intended to diagnose MRSA infection nor to guide or monitor treatment for MRSA infections. Performed at [facility], [place] [address] [place], [place] [address]          Radiology Studies: No results found.      Scheduled Meds: . albuterol  2.5 mg Nebulization BID  . amLODipine  5 mg Oral QHS  . arformoterol  15 mcg Nebulization BID  . aspirin EC  81 mg Oral Daily  . atorvastatin  10 mg Oral QHS  . budesonide (PULMICORT) nebulizer solution  0.5 mg Nebulization BID  . enoxaparin (LOVENOX) injection  30 mg Subcutaneous Daily  . feeding supplement (ENSURE ENLIVE)  237 mL Oral BID BM  . guaiFENesin  600 mg Oral BID  . levothyroxine  75 mcg Oral QAC breakfast  . memantine  5 mg Oral QHS  . methylPREDNISolone (SOLU-MEDROL) injection  40 mg Intravenous Q8H  . metoprolol tartrate  25 mg Oral BID  . montelukast  10 mg Oral Daily  . multivitamin with minerals  1 tablet Oral Daily  . pantoprazole  40 mg Oral Daily  . pramipexole  0.25 mg Oral QHS  . sodium bicarbonate  650 mg Oral BID  . sodium chloride flush  3 mL Intravenous Q12H  . traZODone  50 mg Oral Once  . vitamin B-12  1,000 mcg Oral Daily   Continuous Infusions: . sodium chloride 75 mL/hr at 08/22/19 0131  . azithromycin 500 mg (08/22/19 0843)  . cefTRIAXone (ROCEPHIN)  IV 1 g (08/22/19 1008)     LOS: 2 days        Alma Friendly, MD [facility] 08/22/2019, 2:21 PM

## 2019-08-22 NOTE — Progress Notes (Signed)
Occupational Therapy Treatment Patient Details Name: Lisa Cortez MRN: [id number] DOB: 03-25-1925 Today's Date: 08/22/2019    History of present illness Pt is a 85 y/o female admitted secondary to sepsis and hypoxic respiratory failure. Imaging suspicious for L lower lobe PNA. PMH includes CKD, dementia, HTN, COPD, and merkel cell carcinoma.    OT comments  Pt able to perform bed mobility with supervision and min cuing while on 3 L O2 via [place]. Pt standing from bed with min A but had been incontinent of bowel and bladder. OT assisted pt with hygiene while she maintained balance at min guard level. Pt ambulating 5 feet with RW into recliner chair but very fatigues and "winded" per pt report. Pt appearing to wheeze. RN notified. O2 saturation remains at 98%. Pt seated in recliner chair with all needs within reach. Pt continues to benefit from OT intervention.   Follow Up Recommendations  SNF;Supervision/Assistance - 24 hour    Equipment Recommendations  Other (comment)(defer to next venue of care)       Precautions / Restrictions Precautions Precautions: Fall       Mobility Bed Mobility Overal bed mobility: Needs Assistance Bed Mobility: Supine to Sit;Sit to Supine     Supine to sit: Supervision Sit to supine: Supervision   General bed mobility comments: min cuing for safety and hand placement to get to EOB with HOB elevated  Transfers Overall transfer level: Needs assistance Equipment used: Rolling walker (2 wheeled) Transfers: Sit to/from Stand Sit to Stand: Min guard         General transfer comment: steady assistance for balance    Balance Overall balance assessment: Needs assistance Sitting-balance support: No upper extremity supported;Feet supported Sitting balance-Leahy Scale: Fair Sitting balance - Comments: close supervisoin seated on EOB but min guard with self care tasks   Standing balance support: Single extremity supported;During functional activity Standing  balance-Leahy Scale: Poor Standing balance comment: reliance on RW        ADL either performed or assessed with clinical judgement   ADL Overall ADL's : Needs assistance/impaired      Lower Body Dressing: Minimal assistance;Sitting/lateral leans Lower Body Dressing Details (indicate cue type and reason): Min A for balance while donning B socks      General ADL Comments: Pt incontinent of bowel and bladder prior to OT arrival. Pt standing and OT assisting pt with hygiene before transferring into recliner chair.     Vision Baseline Vision/History: Wears glasses            Cognition Arousal/Alertness: Awake/alert Behavior During Therapy: WFL for tasks assessed/performed Overall Cognitive Status: History of cognitive impairments - at baseline          General Comments: Per chart hx of dementia.                    Pertinent Vitals/ Pain       Pain Assessment: No/denies pain         Frequency  Min 2X/week        Progress Toward Goals  OT Goals(current goals can now be found in the care plan section)  Progress towards OT goals: Progressing toward goals  Acute Rehab OT Goals Patient Stated Goal: to be able to breathe better OT Goal Formulation: With patient/family Time For Goal Achievement: 09/05/19 Potential to Achieve Goals: Good  Plan Discharge plan remains appropriate       AM-PAC OT "6 Clicks" Daily Activity     Outcome Measure  Help from another person eating meals?: A Little Help from another person taking care of personal grooming?: A Little Help from another person toileting, which includes using toliet, bedpan, or urinal?: A Little Help from another person bathing (including washing, rinsing, drying)?: A Little Help from another person to put on and taking off regular upper body clothing?: A Little Help from another person to put on and taking off regular lower body clothing?: A Lot 6 Click Score: 17    End of Session Equipment Utilized  During Treatment: Rolling walker;Oxygen  OT Visit Diagnosis: Unsteadiness on feet (R26.81);Muscle weakness (generalized) (M62.81)   Activity Tolerance Patient limited by fatigue   Patient Left with call bell/phone within reach;in chair;with chair alarm set   Nurse Communication Mobility status;Precautions        Time: [id number] OT Time Calculation (min): 16 min  Charges: OT General Charges $OT Visit: 1 Visit OT Treatments $Therapeutic Activity: 8-22 mins   Aidynn Polendo P, MS, OTR/L 08/22/2019, 12:15 PM

## 2019-08-22 NOTE — Progress Notes (Signed)
  Echocardiogram 2D Echocardiogram has been performed.  Lisa Cortez 08/22/2019, 10:53 AM

## 2019-08-22 NOTE — Progress Notes (Signed)
Lab reports critical lactic acid level of 3.5.  MD notified

## 2019-08-23 LAB — BASIC METABOLIC PANEL
Anion gap: 9 (ref 5–15)
BUN: 39 mg/dL — ABNORMAL HIGH (ref 8–23)
CO2: 18 mmol/L — ABNORMAL LOW (ref 22–32)
Calcium: 8.8 mg/dL — ABNORMAL LOW (ref 8.9–10.3)
Chloride: 110 mmol/L (ref 98–111)
Creatinine, Ser: 1.37 mg/dL — ABNORMAL HIGH (ref 0.44–1.00)
GFR calc Af Amer: 39 mL/min — ABNORMAL LOW (ref >60)
GFR calc non Af Amer: 33 mL/min — ABNORMAL LOW (ref >60)
Glucose, Bld: 146 mg/dL — ABNORMAL HIGH (ref 70–99)
Potassium: 5.5 mmol/L — ABNORMAL HIGH (ref 3.5–5.1)
Sodium: 137 mmol/L (ref 135–145)

## 2019-08-23 LAB — CBC WITH DIFFERENTIAL/PLATELET
Abs Immature Granulocytes: 0.04 10*3/uL (ref 0.00–0.07)
Basophils Absolute: 0 10*3/uL (ref 0.0–0.1)
Basophils Relative: 0 %
Eosinophils Absolute: 0 10*3/uL (ref 0.0–0.5)
Eosinophils Relative: 0 %
HCT: 33.4 % — ABNORMAL LOW (ref 36.0–46.0)
Hemoglobin: 10.5 g/dL — ABNORMAL LOW (ref 12.0–15.0)
Immature Granulocytes: 1 %
Lymphocytes Relative: 11 %
Lymphs Abs: 0.8 10*3/uL (ref 0.7–4.0)
MCH: 31.2 pg (ref 26.0–34.0)
MCHC: 31.4 g/dL (ref 30.0–36.0)
MCV: 99.1 fL (ref 80.0–100.0)
Monocytes Absolute: 0.4 10*3/uL (ref 0.1–1.0)
Monocytes Relative: 6 %
Neutro Abs: 6.4 10*3/uL (ref 1.7–7.7)
Neutrophils Relative %: 82 %
Platelets: 210 10*3/uL (ref 150–400)
RBC: 3.37 MIL/uL — ABNORMAL LOW (ref 3.87–5.11)
RDW: 13.8 % (ref 11.5–15.5)
WBC: 7.6 10*3/uL (ref 4.0–10.5)
nRBC: 0 % (ref 0.0–0.2)

## 2019-08-23 LAB — CULTURE, RESPIRATORY W GRAM STAIN: Culture: NORMAL

## 2019-08-23 MED ORDER — SODIUM CHLORIDE 0.9% FLUSH
10.0000 mL | Freq: Two times a day (BID) | INTRAVENOUS | Status: DC
  Administered 2019-08-23 (×2): 10 mL

## 2019-08-23 MED ORDER — PREDNISONE 20 MG PO TABS
40.0000 mg | ORAL_TABLET | Freq: Every day | ORAL | Status: DC
  Administered 2019-08-24: 40 mg via ORAL
  Filled 2019-08-23: qty 2

## 2019-08-23 MED ORDER — SODIUM CHLORIDE 0.9% FLUSH: 10.0000 mL | INTRAVENOUS | Status: DC | PRN

## 2019-08-23 MED ORDER — METHYLPREDNISOLONE SODIUM SUCC 40 MG IJ SOLR
40.0000 mg | Freq: Two times a day (BID) | INTRAMUSCULAR | Status: AC
  Administered 2019-08-23: 40 mg via INTRAVENOUS
  Filled 2019-08-23: qty 1

## 2019-08-23 MED ORDER — LOPERAMIDE HCL 2 MG PO CAPS
2.0000 mg | ORAL_CAPSULE | Freq: Once | ORAL | Status: AC
  Administered 2019-08-23: 2 mg via ORAL
  Filled 2019-08-23: qty 1

## 2019-08-23 MED ORDER — SODIUM POLYSTYRENE SULFONATE 15 GM/60ML PO SUSP
30.0000 g | Freq: Once | ORAL | Status: AC
  Administered 2019-08-23: 30 g via ORAL
  Filled 2019-08-23: qty 120

## 2019-08-23 MED ORDER — METHYLPREDNISOLONE SODIUM SUCC 40 MG IJ SOLR: 40.0000 mg | Freq: Two times a day (BID) | INTRAMUSCULAR | Status: DC

## 2019-08-23 NOTE — Progress Notes (Signed)
Patient ID: Lisa Cortez, female   DOB: 02-28-1925, 85 y.o.   MRN: [id number]  PROGRESS NOTE    Lisa Cortez  [id number] DOB: [birth year] DOA: 08/20/2019 PCP: Patient, No Pcp Per   Brief Narrative:  85 year old female with history of hypertension, hyperlipidemia, COPD, vitamin B12 deficiency, Merkel cell carcinoma, chronic kidney disease stage IV, mild dementia and hypothyroidism presented on 08/20/2019 with cough and chest discomfort.  She was found to be febrile up to 101.3 with tachypnea, hypoxia, leukocytosis.  COVID-19 test was negative.  Chest x-ray revealed hazy and patchy opacity of the left lung base.  She was started on broad-spectrum antibiotics.  Assessment & Plan:   Sepsis likely 2/2 CAP Present on admission Currently afebrile, with resolved leukocytosis BC x2 NGTD Lactic acidosis, trended down to 2.1 Sputum Gram stain showed few gram-positive cocci, few gram-positive rods, rare budding yeast, culture pending Urine strep pneumo, Legionella both negative Continue azithromycin, ceftriaxone  Acute hypoxic respiratory failure Likely 2/2 as above, COPD exacerbation Chest x-ray on presentation showed possible left lower lobe hazy opacity concerning for pneumonia COVID-19 negative Continue Rocephin and Zithromax Continue supplemental oxygen PRN Further management as above  COPD with possible exacerbation Continue neb treatments Continue Solu-Medrol, Singulair, incentive spirometry.  Chest pain Currently chest pain-free Troponins x2 neg EKG showed some ST depressions in the lateral leads 2D echo shows EF of 60 to 65%, left ventricular diastolic Doppler parameters are consistent with impaired relaxation  Anemia of chronic renal disease No signs of obvious bleeding Baseline hemoglobin around 11 Anemia panel showed iron 34, sats 9, TIBC 369, ferritin 45, folate 14.7, B12 2,438 Daily CBC  Chronic kidney disease stage IV with metabolic acidosis Creatinine 1.8-2 at baseline.  Increased sodium bicarbonate Daily BMP  Hyperlipidemia Continue Lipitor  Essential hypertension Blood pressure stable Continue metoprolol and amlodipine.  Dementia Continue Namenda  GERD Continue Protonix  Generalized conditioning PT eval.  Patient was living in independent living facility prior to presentation, refusing SNF    DVT prophylaxis: Lovenox Code Status: DNR  Family Communication: Discussed plan of care with daughter over the phone on 08/23/2019 Disposition Plan: Back to previous independent living, patient refusing SNF  Consultants: None  Procedures: None  Antimicrobials:  Anti-infectives (From admission, onward)   Start     Dose/Rate Route Frequency Ordered Stop   08/22/19 1000  vancomycin (VANCOCIN) IVPB 750 mg/150 ml premix  Status:  Discontinued     750 mg 150 mL/hr over 60 Minutes Intravenous Every 48 hours 08/20/19 0837 08/21/19 1038   08/21/19 1045  cefTRIAXone (ROCEPHIN) 1 g in sodium chloride 0.9 % 100 mL IVPB     1 g 200 mL/hr over 30 Minutes Intravenous Every 24 hours 08/21/19 1039     08/21/19 1000  ceFEPIme (MAXIPIME) 2 g in sodium chloride 0.9 % 100 mL IVPB  Status:  Discontinued     2 g 200 mL/hr over 30 Minutes Intravenous Every 24 hours 08/20/19 2211 08/21/19 1038   08/20/19 0900  azithromycin (ZITHROMAX) 500 mg in sodium chloride 0.9 % 250 mL IVPB     500 mg 250 mL/hr over 60 Minutes Intravenous Every 24 hours 08/20/19 0855     08/20/19 0845  vancomycin (VANCOCIN) IVPB 1000 mg/200 mL premix     1,000 mg 200 mL/hr over 60 Minutes Intravenous  Once 08/20/19 0833 08/20/19 1420   08/20/19 0500  ceFEPIme (MAXIPIME) 2 g in sodium chloride 0.9 % 100 mL IVPB     2 g 200 mL/hr over  30 Minutes Intravenous  Once 08/20/19 0456 08/20/19 0855       Subjective: Patient denies any new complaints.  Currently having loose stools likely due to antibiotics in combination with Kayexalate.  Denies any abdominal pain, nausea/vomiting, fever/chills.   Objective: Vitals:   08/22/19 1313 08/22/19 2036 08/22/19 2132 08/23/19 0505  BP: (!) 142/66  (!) 151/69 (!) 145/70  Pulse: 67  79 70  Resp: 16  20 20   Temp: (!) 97.4 F (36.3 C)  (!) 97.5 F (36.4 C) 97.8 F (36.6 C)  TempSrc:   Oral Oral  SpO2: 95% 96% 100% 96%  Weight:    66.6 kg  Height:        Intake/Output Summary (Last 24 hours) at 08/23/2019 1212 Last data filed at 08/22/2019 2126 Gross per 24 hour  Intake 240 ml  Output -  Net 240 ml   Filed Weights   08/20/19 0815 08/23/19 0505  Weight: 61.2 kg 66.6 kg    Examination:  General: NAD, hard of hearing  Cardiovascular: S1, S2 present  Respiratory:  Bilateral diminished breath sounds at the bases  Abdomen: Soft, nontender, nondistended, bowel sounds present  Musculoskeletal: No bilateral pedal edema noted  Skin: Normal  Psychiatry: Normal mood    Data Reviewed: I have personally reviewed following labs and imaging studies  CBC: Recent Labs  Lab 08/20/19 0524 08/21/19 0152 08/22/19 1502 08/23/19 0328  WBC 12.9* 12.4* 9.7 7.6  NEUTROABS 10.8*  --  8.2* 6.4  HGB 11.2* 9.7* 11.0* 10.5*  HCT 35.8* 29.7* 35.5* 33.4*  MCV 100.3* 96.4 101.1* 99.1  PLT 213 171 224 [id number]   Basic Metabolic Panel: Recent Labs  Lab 08/20/19 0524 08/21/19 0152 08/22/19 1502 08/23/19 0328  NA 139 138 140 137  K 4.3 4.8 5.8* 5.5*  CL 108 112* 108 110  CO2 19* 18* 19* 18*  GLUCOSE 118* 122* 107* 146*  BUN 30* 29* 37* 39*  CREATININE 1.85* 1.52* 1.47* 1.37*  CALCIUM 8.8* 8.5* 9.2 8.8*   GFR: Estimated Creatinine Clearance: 21.8 mL/min (A) (by C-G formula based on SCr of 1.37 mg/dL (H)). Liver Function Tests: Recent Labs  Lab 08/20/19 0524  AST 63*  ALT 40  ALKPHOS 186*  BILITOT 0.8  PROT 7.4  ALBUMIN 3.3*   No results for input(s): LIPASE, AMYLASE in the last 168 hours. No results for input(s): AMMONIA in the last 168 hours. Coagulation Profile: No results for input(s): INR, PROTIME in the last 168 hours.  Cardiac Enzymes: No results for input(s): CKTOTAL, CKMB, CKMBINDEX, TROPONINI in the last 168 hours. BNP (last 3 results) No results for input(s): PROBNP in the last 8760 hours. HbA1C: No results for input(s): HGBA1C in the last 72 hours. CBG: No results for input(s): GLUCAP in the last 168 hours. Lipid Profile: No results for input(s): CHOL, HDL, LDLCALC, TRIG, CHOLHDL, LDLDIRECT in the last 72 hours. Thyroid Function Tests: No results for input(s): TSH, T4TOTAL, FREET4, T3FREE, THYROIDAB in the last 72 hours. Anemia Panel: Recent Labs    08/22/19 1502  VITAMINB12 2,438*  FOLATE 14.7  FERRITIN 45  TIBC 363  IRON 34   Sepsis Labs: Recent Labs  Lab 08/20/19 1226 08/22/19 1502 08/22/19 1853 08/22/19 2208  LATICACIDVEN 2.7* 3.5* 3.1* 2.1*    Recent Results (from the past 240 hour(s))  Blood culture (routine x 2)     Status: None (Preliminary result)   Collection Time: 08/20/19  4:43 AM   Specimen: BLOOD RIGHT HAND  Result Value Ref  Range Status   Specimen Description BLOOD RIGHT HAND  Final   Special Requests   Final    BOTTLES DRAWN AEROBIC ONLY Blood Culture results may not be optimal due to an inadequate volume of blood received in culture bottles   Culture   Final    NO GROWTH 2 DAYS Performed at [facility], [place] [address], [place], [place] [address]    Report Status PENDING  Incomplete  Blood culture (routine x 2)     Status: None (Preliminary result)   Collection Time: 08/20/19  5:30 AM   Specimen: BLOOD  Result Value Ref Range Status   Specimen Description BLOOD RIGHT THUMB  Final   Special Requests   Final    BOTTLES DRAWN AEROBIC ONLY Blood Culture results may not be optimal due to an inadequate volume of blood received in culture bottles   Culture   Final    NO GROWTH 2 DAYS Performed at [facility], [place] [address] [place], [place] [address]    Report Status PENDING  Incomplete  SARS Coronavirus 2 [facility] order, Performed in [facility] hospital lab) Nasopharyngeal Nasopharyngeal Swab     Status: None   Collection Time: 08/20/19  6:05 AM   Specimen: Nasopharyngeal Swab  Result Value Ref Range Status   SARS Coronavirus 2 NEGATIVE NEGATIVE Final    Comment: (NOTE) If result is NEGATIVE SARS-CoV-2 target nucleic acids are NOT DETECTED. The SARS-CoV-2 RNA is generally detectable in upper and lower  respiratory specimens during the acute phase of infection. The lowest  concentration of SARS-CoV-2 viral copies this assay can detect is 250  copies / mL. A negative result does not preclude SARS-CoV-2 infection  and should not be used as the sole basis for treatment or other  patient management decisions.  A negative result may occur with  improper specimen collection / handling, submission of specimen other  than nasopharyngeal swab, presence of viral mutation(s) within the  areas targeted by this assay, and inadequate number of viral copies  (<250 copies / mL). A negative result must be combined with clinical  observations, patient history, and epidemiological information. If result is POSITIVE SARS-CoV-2 target nucleic acids are DETECTED. The SARS-CoV-2 RNA is generally detectable in upper and lower  respiratory specimens dur ing the acute phase of infection.  Positive  results are indicative of active infection with SARS-CoV-2.  Clinical  correlation with patient history and other diagnostic information is  necessary to determine patient infection status.  Positive results do  not rule out bacterial infection or co-infection with other viruses. If result is PRESUMPTIVE POSTIVE SARS-CoV-2 nucleic acids MAY BE PRESENT.   A presumptive positive result was obtained on the submitted specimen  and confirmed on repeat testing.  While 2019 novel coronavirus  (SARS-CoV-2) nucleic acids may be present in the submitted sample  additional confirmatory testing may be necessary for epidemiological  and / or clinical management  purposes  to differentiate between  SARS-CoV-2 and other Sarbecovirus currently known to infect humans.  If clinically indicated additional testing with an alternate test  methodology [phone]) is advised. The SARS-CoV-2 RNA is generally  detectable in upper and lower respiratory sp ecimens during the acute  phase of infection. The expected result is Negative. Fact Sheet for Patients:  StrictlyIdeas.no Fact Sheet for Healthcare Providers: BankingDealers.co.za This test is not yet approved or cleared by the [place] FDA and has been authorized for detection and/or diagnosis of SARS-CoV-2 by FDA  under an Emergency Use Authorization (EUA).  This EUA will remain in effect (meaning this test can be used) for the duration of the COVID-19 declaration under Section 564(b)(1) of the Act, 21 U.S.C. section 360bbb-3(b)(1), unless the authorization is terminated or revoked sooner. Performed at [facility], [place] [address], [place], [place] [address]   Culture, sputum-assessment     Status: None   Collection Time: 08/20/19  7:54 AM   Specimen: Sputum  Result Value Ref Range Status   Specimen Description SPUTUM  Final   Special Requests NONE  Final   Sputum evaluation   Final    THIS SPECIMEN IS ACCEPTABLE FOR SPUTUM CULTURE Performed at [facility], [address] [place], [place] [address]    Report Status 08/21/2019 FINAL  Final  Culture, respiratory     Status: None   Collection Time: 08/20/19  7:54 AM   Specimen: SPU  Result Value Ref Range Status   Specimen Description SPUTUM  Final   Special Requests NONE Reflexed from [id number]  Final   Gram Stain   Final    MODERATE WBC PRESENT,BOTH PMN AND MONONUCLEAR FEW GRAM POSITIVE COCCI IN PAIRS FEW GRAM POSITIVE RODS RARE BUDDING YEAST SEEN    Culture   Final    Consistent with normal respiratory flora. Performed at [facility], [place] [address] [place], [place]  [address]    Report Status 08/23/2019 FINAL  Final  MRSA PCR Screening     Status: None   Collection Time: 08/20/19  1:12 PM   Specimen: Nasopharyngeal  Result Value Ref Range Status   MRSA by PCR NEGATIVE NEGATIVE Final    Comment:        The GeneXpert MRSA Assay (FDA approved for NASAL specimens only), is one component of a comprehensive MRSA colonization surveillance program. It is not intended to diagnose MRSA infection nor to guide or monitor treatment for MRSA infections. Performed at [facility], [place] [address], [place], [place] [address]          Radiology Studies: Dg Chest Port 1 View  Result Date: 08/22/2019 CLINICAL DATA:  Fluid overload. EXAM: PORTABLE CHEST 1 VIEW COMPARISON:  August 20, 2019 FINDINGS: Cardiomediastinal silhouette is normal. Mediastinal contours appear intact. Calcific atherosclerotic disease of the aorta. There is no evidence of focal airspace consolidation, pleural effusion or pneumothorax. Osseous structures are without acute abnormality. Soft tissues are grossly normal. IMPRESSION: No active disease. Electronically Signed   By: Fidela Salisbury M.D.   On: 08/22/2019 16:34        Scheduled Meds: . albuterol  2.5 mg Nebulization BID  . amLODipine  5 mg Oral QHS  . arformoterol  15 mcg Nebulization BID  . aspirin EC  81 mg Oral Daily  . atorvastatin  10 mg Oral QHS  . budesonide (PULMICORT) nebulizer solution  0.5 mg Nebulization BID  . enoxaparin (LOVENOX) injection  30 mg Subcutaneous Daily  . feeding supplement (ENSURE ENLIVE)  237 mL Oral BID BM  . guaiFENesin  600 mg Oral BID  . levothyroxine  75 mcg Oral QAC breakfast  . memantine  5 mg Oral QHS  . methylPREDNISolone (SOLU-MEDROL) injection  40 mg Intravenous Q12H  . metoprolol tartrate  25 mg Oral BID  . montelukast  10 mg Oral Daily  . multivitamin with minerals  1 tablet Oral Daily  . pantoprazole  40 mg Oral Daily  . pramipexole  0.25 mg Oral QHS  .  sodium bicarbonate   650 mg Oral TID  . sodium chloride flush  10-40 mL Intracatheter Q12H  . sodium chloride flush  3 mL Intravenous Q12H  . traZODone  50 mg Oral Once  . vitamin B-12  1,000 mcg Oral Daily   Continuous Infusions: . azithromycin 500 mg (08/23/19 0959)  . cefTRIAXone (ROCEPHIN)  IV 1 g (08/23/19 0909)     LOS: 3 days        Alma Friendly, MD [facility] 08/23/2019, 12:12 PM

## 2019-08-23 NOTE — Care Management Important Message (Signed)
Important Message  Patient Details  Name: Gentri Cutillo MRN: [id number] Date of Birth: 05-23-1925   Medicare Important Message Given:  Yes     Ademide Schaberg 08/23/2019, 11:23 AM

## 2019-08-24 LAB — BASIC METABOLIC PANEL
Anion gap: 7 (ref 5–15)
BUN: 37 mg/dL — ABNORMAL HIGH (ref 8–23)
CO2: 21 mmol/L — ABNORMAL LOW (ref 22–32)
Calcium: 8.3 mg/dL — ABNORMAL LOW (ref 8.9–10.3)
Chloride: 109 mmol/L (ref 98–111)
Creatinine, Ser: 1.25 mg/dL — ABNORMAL HIGH (ref 0.44–1.00)
GFR calc Af Amer: 43 mL/min — ABNORMAL LOW (ref >60)
GFR calc non Af Amer: 37 mL/min — ABNORMAL LOW (ref >60)
Glucose, Bld: 124 mg/dL — ABNORMAL HIGH (ref 70–99)
Potassium: 3.9 mmol/L (ref 3.5–5.1)
Sodium: 137 mmol/L (ref 135–145)

## 2019-08-24 LAB — CBC WITH DIFFERENTIAL/PLATELET
Abs Immature Granulocytes: 0.06 10*3/uL (ref 0.00–0.07)
Basophils Absolute: 0 10*3/uL (ref 0.0–0.1)
Basophils Relative: 0 %
Eosinophils Absolute: 0 10*3/uL (ref 0.0–0.5)
Eosinophils Relative: 0 %
HCT: 32.2 % — ABNORMAL LOW (ref 36.0–46.0)
Hemoglobin: 10.2 g/dL — ABNORMAL LOW (ref 12.0–15.0)
Immature Granulocytes: 1 %
Lymphocytes Relative: 13 %
Lymphs Abs: 0.7 10*3/uL (ref 0.7–4.0)
MCH: 31.2 pg (ref 26.0–34.0)
MCHC: 31.7 g/dL (ref 30.0–36.0)
MCV: 98.5 fL (ref 80.0–100.0)
Monocytes Absolute: 0.5 10*3/uL (ref 0.1–1.0)
Monocytes Relative: 8 %
Neutro Abs: 4.5 10*3/uL (ref 1.7–7.7)
Neutrophils Relative %: 78 %
Platelets: 214 10*3/uL (ref 150–400)
RBC: 3.27 MIL/uL — ABNORMAL LOW (ref 3.87–5.11)
RDW: 13.4 % (ref 11.5–15.5)
WBC: 5.8 10*3/uL (ref 4.0–10.5)
nRBC: 0 % (ref 0.0–0.2)

## 2019-08-24 MED ORDER — GUAIFENESIN ER 600 MG PO TB12: 600.0000 mg | ORAL_TABLET | Freq: Two times a day (BID) | ORAL | 0 refills | Status: AC | PRN

## 2019-08-24 MED ORDER — AZITHROMYCIN 500 MG PO TABS: 500.0000 mg | ORAL_TABLET | Freq: Every day | ORAL | 0 refills | Status: AC

## 2019-08-24 MED ORDER — PREDNISONE 10 MG PO TABS: ORAL_TABLET | ORAL | 0 refills | Status: DC

## 2019-08-24 MED ORDER — CEFDINIR 300 MG PO CAPS: 300.0000 mg | ORAL_CAPSULE | Freq: Two times a day (BID) | ORAL | 0 refills | Status: AC

## 2019-08-24 NOTE — Progress Notes (Signed)
Physical Therapy Treatment Patient Details Name: Lisa Cortez MRN: [id number] DOB: 07-31-1925 Today's Date: 08/24/2019    History of Present Illness Pt is a 85 y/o female admitted secondary to sepsis and hypoxic respiratory failure. Imaging suspicious for L lower lobe PNA. PMH includes CKD, dementia, HTN, COPD, and merkel cell carcinoma.     PT Comments    Pt did really well today. She was 96% on RA sitting EOB.  Pt walked 100 feet with RW on RA.  Pt more SOB at end of treatment - her O2 to 85% but back to 96% within 4 minutes on RA.  I will teach her use of inspirometer.  Pt wanting to go to her ILF.  She may need O2 at DC - she didn't have it before.  (she says she does other inhalers at home that would make her breath better - and she is not getting them here).  I recommend HH PT to help make sure she increases her activity safely.   Follow Up Recommendations  Home health PT     Equipment Recommendations  None recommended by PT    Recommendations for Other Services       Precautions / Restrictions Precautions Precaution Comments: pt denies falls in 2020 Restrictions Weight Bearing Restrictions: No RLE Weight Bearing: Weight bearing as tolerated Other Position/Activity Restrictions: pt educated in breathing strategies - will get her inspirometer and teach her how to use    Mobility  Bed Mobility               General bed mobility comments: it sitting EOB when i arrived  Transfers Overall transfer level: Needs assistance Equipment used: Rolling walker (2 wheeled) Transfers: Sit to/from Stand Sit to Stand: Supervision         General transfer comment: steady assistance for balance  Ambulation/Gait Ambulation/Gait assistance: Supervision Gait Distance (Feet): 100 Feet Assistive device: Rolling walker (2 wheeled) Gait Pattern/deviations: Step-through pattern;Decreased stride length     General Gait Details: pt did well walking with RW - educated on not talking  and breathing strategies and not going fast.  pt safe with turning and backing up.  no loss of balance seen today   Stairs             Wheelchair Mobility    Modified Rankin (Stroke Patients Only)       Balance Overall balance assessment: Needs assistance             Standing balance comment: reliance on RW - reports weaker than normal as she has been in bed for days.                            Cognition Arousal/Alertness: Awake/alert Behavior During Therapy: WFL for tasks assessed/performed Overall Cognitive Status: History of cognitive impairments - at baseline                                 General Comments: Per chart hx of dementia.   pt very cooperative and appropriate with me      Exercises      General Comments General comments (skin integrity, edema, etc.): focus on breathing strategies      Pertinent Vitals/Pain Pain Assessment: No/denies pain    Home Living                      Prior  Function            PT Goals (current goals can now be found in the care plan section) Progress towards PT goals: Progressing toward goals    Frequency    Min 3X/week      PT Plan Current plan remains appropriate    Co-evaluation              AM-PAC PT "6 Clicks" Mobility   Outcome Measure  Help needed turning from your back to your side while in a flat bed without using bedrails?: A Little Help needed moving from lying on your back to sitting on the side of a flat bed without using bedrails?: A Little Help needed moving to and from a bed to a chair (including a wheelchair)?: A Little Help needed standing up from a chair using your arms (e.g., wheelchair or bedside chair)?: A Little Help needed to walk in hospital room?: A Little Help needed climbing 3-5 steps with a railing? : A Lot 6 Click Score: 17    End of Session Equipment Utilized During Treatment: Gait belt Activity Tolerance: Patient tolerated  treatment well Patient left: in chair;with chair alarm set;with call bell/phone within reach Nurse Communication: Mobility status PT Visit Diagnosis: Other abnormalities of gait and mobility (R26.89);Muscle weakness (generalized) (M62.81);Difficulty in walking, not elsewhere classified (R26.2)     Time: [id number] PT Time Calculation (min) (ACUTE ONLY): 30 min  Charges:  $Gait Training: 23-37 mins                    08/24/2019   Rande Lawman, PT    Loyal Buba 08/24/2019, 9:59 AM

## 2019-08-24 NOTE — Discharge Summary (Signed)
Discharge Summary  Lisa Cortez [id number] DOB: 1925-10-17  PCP: Patient, No Pcp Per  Admit date: 08/20/2019 Discharge date: 08/24/2019  Time spent: 35 mins  Recommendations for Outpatient Follow-up:  1. 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    Discharge Condition: Stable  Diet recommendation: Heart healthy  Vitals:   08/24/19 0716 08/24/19 0717  BP:    Pulse:    Resp:    Temp:    SpO2: 98% 98%    History of present illness:  85 year old female with history of hypertension, hyperlipidemia, COPD, vitamin B12 deficiency, Merkel cell carcinoma, chronic kidney disease stage IV, mild dementia and hypothyroidism presented on 08/20/2019 with cough and chest discomfort.  She was found to be febrile up to 101.3 with tachypnea, hypoxia, leukocytosis.  COVID-19 test was negative.  Chest x-ray revealed hazy and patchy opacity of the left lung base.  She was started on broad-spectrum antibiotics.   Today, patient denies any new complaints, reports feeling much better.  Eager to go back to her independent living.  Had a good session with PT, noted to desaturate upon ambulation.  Recommend home O2 upon ambulation.  Plan for home health PT.  Hospital Course:  Principal Problem:   Sepsis ([place]) Active Problems:   Pneumonia   COPD with acute exacerbation ([place])   Acute on chronic respiratory failure with hypoxia ([facility])   Chest pain   Nausea and vomiting   Macrocytic anemia   Hypertension   Mild  memory loss following organic brain damage  Sepsis likely 2/2 CAP Resolved Currently afebrile, with resolved leukocytosis BC x2 NGTD Lactic acidosis, trended down Sputum culture consistent with normal flora Urine strep pneumo, Legionella both negative S/P azithromycin, ceftriaxone, will discharge patient on p.o. Omnicef, azithromycin for a total of 7 days of antibiotics Follow-up with PCP  Acute hypoxic respiratory failure Improved Likely 2/2 as above, COPD exacerbation Chest x-ray on presentation showed possible left lower lobe hazy opacity concerning for pneumonia, repeat unremarkable COVID-19 negative Recommend home O2 during exertion/ambulation Follow-up with PCP  COPD with possible exacerbation Improved Continue tapered dose of prednisone, home inhalers, Singulair, incentive spirometry Follow-up with PCP  Chest pain Currently chest pain-free Troponins x2 neg EKG showed some ST depressions in the lateral leads 2D echo shows EF of 60 to 65%, left ventricular diastolic Doppler parameters are consistent with impaired relaxation  Anemia of chronic renal disease No signs of obvious bleeding Baseline hemoglobin around 11 Anemia panel showed iron 34, saturation 9, TIBC 363, ferritin 45, folate 14.7, B12 2438 Follow-up with PCP, with repeat labs  Chronic kidney disease stage IV with metabolic acidosis Improved Creatinine 1.8-2 at baseline Continue sodium bicarbonate Follow-up with PCP with repeat labs  Hyperlipidemia Continue Lipitor  Essential hypertension Continue metoprolol and amlodipine.  Dementia Continue Namenda  GERD Continue PPI  Generalized conditioning PT eval.  Patient was living in independent living facility prior to presentation, refusing SNF Home health PT         Malnutrition Type:  Nutrition Problem: Increased nutrient needs  Etiology: acute illness(sepsis due to pueumonia)   Malnutrition Characteristics:  Signs/Symptoms:  estimated needs   Nutrition Interventions:  Interventions: Ensure Enlive (each supplement provides 350kcal and 20 grams of protein), MVI, Magic cup   Estimated body mass index is 30.63 kg/m as calculated from the following:   Height as of this encounter: 4\' 11"  (1.499 m).   Weight as of this encounter: 68.8 kg.    Procedures:  None  Consultations:  None  Discharge Exam: BP (!) 183/74 (BP Location: Right Arm)    Pulse 65    Temp 97.9 F (36.6 C) (Oral)    Resp 18    Ht 4\' 11"  (1.499 m)    Wt 68.8 kg    SpO2 98%    BMI 30.63 kg/m   General: NAD Cardiovascular: S1, S2 present Respiratory: Diminished breath sounds bilaterally  Discharge Instructions You were cared for by a hospitalist during your hospital stay. If you have any questions about your discharge medications or the care you received while you were in the hospital after you are discharged, you can call the unit and asked to speak with the hospitalist on call if the hospitalist that took care of you is not available. Once you are discharged, your primary care physician will handle any further medical issues. Please note that NO REFILLS for any discharge medications will be authorized once you are discharged, as it is imperative that you return to your primary care physician (or establish a relationship with a primary care physician if you do not have one) for your aftercare needs so that they can reassess your need for medications and monitor your lab values.   Allergies as of 08/24/2019      Reactions   Tenex [guanfacine Hcl] Other (See Comments)   unknown   Calcitonin Other (See Comments)   unknown   Fosamax [alendronate Sodium] Other (See Comments)   unknown   Gatifloxacin Other (See Comments)   unknown   Procrit [epoetin (alfa)] Other (See Comments)   unknown   Verapamil Other (See Comments)   unknown      Medication List    TAKE these medications   albuterol 108 (90 Base) MCG/ACT inhaler Commonly known  as: VENTOLIN HFA Inhale 2 puffs into the lungs every 4 (four) hours as needed.   amLODipine 5 MG tablet Commonly known as: NORVASC Take 5 mg by mouth at bedtime.   aspirin EC 81 MG tablet Take 81 mg by mouth daily.   atorvastatin 10 MG tablet Commonly known as: LIPITOR Take 10 mg by mouth at bedtime.   azithromycin 500 MG tablet Commonly known as: Zithromax Take 1 tablet (500 mg total) by mouth daily for 2 days.   B-12 2000 MCG Tabs Take 1,000 mcg by mouth daily.   cefdinir 300 MG capsule Commonly known as: OMNICEF Take 1 capsule (300 mg total) by mouth 2 (two) times daily for 2 days.   Fluticasone-Salmeterol 250-50 MCG/DOSE Aepb Commonly known as: ADVAIR Inhale 1 puff into the lungs 2 (two) times daily.   guaiFENesin 600 MG 12 hr tablet Commonly known as: MUCINEX Take 1 tablet (600 mg total) by mouth 2 (two) times daily as needed for up to 5 days for cough.   levothyroxine 75 MCG tablet Commonly known as: SYNTHROID Take 75 mcg by mouth daily.   memantine 5 MG tablet Commonly known as: NAMENDA Take 5 mg by mouth at bedtime.   metoprolol tartrate 25 MG tablet Commonly known as: LOPRESSOR Take 25  mg by mouth 2 (two) times daily.   montelukast 10 MG tablet Commonly known as: SINGULAIR Take 10 mg by mouth daily.   omeprazole 20 MG capsule Commonly known as: PRILOSEC Take 20 mg by mouth daily.   OVER THE COUNTER MEDICATION Take 5 mLs by mouth daily.   pramipexole 0.25 MG tablet Commonly known as: MIRAPEX Take 0.25 mg by mouth at bedtime.   predniSONE 10 MG tablet Commonly known as: DELTASONE Take 4 tabs daily for 1 day, 3 tabs daily for 2 days, 2 tabs daily for 2 days, 1 tab daily for 2 days and complete   sodium bicarbonate 650 MG tablet Take 650 mg by mouth 2 (two) times daily.            Durable Medical Equipment  (From admission, onward)         Start     Ordered   08/24/19 1017  For home use only DME oxygen  Once    Question Answer  Comment  Length of Need 6 Months   Mode or (Route) Nasal cannula   Liters per Minute 2   Frequency Continuous (stationary and portable oxygen unit needed)   Oxygen delivery system Gas      08/24/19 1017         Allergies  Allergen Reactions   Tenex [Guanfacine Hcl] Other (See Comments)    unknown   Calcitonin Other (See Comments)    unknown   Fosamax [Alendronate Sodium] Other (See Comments)    unknown   Gatifloxacin Other (See Comments)    unknown   Procrit [Epoetin (Alfa)] Other (See Comments)    unknown   Verapamil Other (See Comments)    unknown      The results of significant diagnostics from this hospitalization (including imaging, microbiology, ancillary and laboratory) are listed below for reference.    Significant Diagnostic Studies: Dg Chest Port 1 View  Result Date: 08/22/2019 CLINICAL DATA:  Fluid overload. EXAM: PORTABLE CHEST 1 VIEW COMPARISON:  August 20, 2019 FINDINGS: Cardiomediastinal silhouette is normal. Mediastinal contours appear intact. Calcific atherosclerotic disease of the aorta. There is no evidence of focal airspace consolidation, pleural effusion or pneumothorax. Osseous structures are without acute abnormality. Soft tissues are grossly normal. IMPRESSION: No active disease. Electronically Signed   By: Fidela Salisbury M.D.   On: 08/22/2019 16:34   Dg Chest Portable 1 View  Result Date: 08/20/2019 CLINICAL DATA:  Initial evaluation for acute chest pain, cough, chills. EXAM: PORTABLE CHEST 1 VIEW COMPARISON:  None available. FINDINGS: Cardiomegaly. Mediastinal silhouette within normal limits. Aortic atherosclerosis. Lungs mildly hypoinflated. Hazy and patchy opacity at the left lung base, which could reflect atelectasis and/or infiltrate. Mild scattered right basilar subsegmental atelectasis. Perihilar vascular congestion without pulmonary edema. No visible pleural effusion. No pneumothorax. No acute osseous finding. IMPRESSION: 1. Hazy and  patchy opacity at the left lung base, which could reflect atelectasis and/or infiltrate. 2. Cardiomegaly with perihilar vascular congestion without overt pulmonary edema. 3. Aortic atherosclerosis. Electronically Signed   By: Jeannine Boga M.D.   On: 08/20/2019 04:45    Microbiology: Recent Results (from the past 240 hour(s))  Blood culture (routine x 2)     Status: None (Preliminary result)   Collection Time: 08/20/19  4:43 AM   Specimen: BLOOD RIGHT HAND  Result Value Ref Range Status   Specimen Description BLOOD RIGHT HAND  Final   Special Requests   Final    BOTTLES DRAWN AEROBIC ONLY Blood Culture results may  not be optimal due to an inadequate volume of blood received in culture bottles   Culture   Final    NO GROWTH 3 DAYS Performed at [facility], [place] [address], [place], [place] [address]    Report Status PENDING  Incomplete  Blood culture (routine x 2)     Status: None (Preliminary result)   Collection Time: 08/20/19  5:30 AM   Specimen: BLOOD  Result Value Ref Range Status   Specimen Description BLOOD RIGHT THUMB  Final   Special Requests   Final    BOTTLES DRAWN AEROBIC ONLY Blood Culture results may not be optimal due to an inadequate volume of blood received in culture bottles   Culture   Final    NO GROWTH 3 DAYS Performed at [facility], [place] [address], [place], [place] [address]    Report Status PENDING  Incomplete  SARS Coronavirus 2 [facility] order, Performed in [facility] hospital lab) Nasopharyngeal Nasopharyngeal Swab     Status: None   Collection Time: 08/20/19  6:05 AM   Specimen: Nasopharyngeal Swab  Result Value Ref Range Status   SARS Coronavirus 2 NEGATIVE NEGATIVE Final    Comment: (NOTE) If result is NEGATIVE SARS-CoV-2 target nucleic acids are NOT DETECTED. The SARS-CoV-2 RNA is generally detectable in upper and lower  respiratory specimens during the acute phase of infection. The lowest  concentration of SARS-CoV-2 viral  copies this assay can detect is 250  copies / mL. A negative result does not preclude SARS-CoV-2 infection  and should not be used as the sole basis for treatment or other  patient management decisions.  A negative result may occur with  improper specimen collection / handling, submission of specimen other  than nasopharyngeal swab, presence of viral mutation(s) within the  areas targeted by this assay, and inadequate number of viral copies  (<250 copies / mL). A negative result must be combined with clinical  observations, patient history, and epidemiological information. If result is POSITIVE SARS-CoV-2 target nucleic acids are DETECTED. The SARS-CoV-2 RNA is generally detectable in upper and lower  respiratory specimens dur ing the acute phase of infection.  Positive  results are indicative of active infection with SARS-CoV-2.  Clinical  correlation with patient history and other diagnostic information is  necessary to determine patient infection status.  Positive results do  not rule out bacterial infection or co-infection with other viruses. If result is PRESUMPTIVE POSTIVE SARS-CoV-2 nucleic acids MAY BE PRESENT.   A presumptive positive result was obtained on the submitted specimen  and confirmed on repeat testing.  While 2019 novel coronavirus  (SARS-CoV-2) nucleic acids may be present in the submitted sample  additional confirmatory testing may be necessary for epidemiological  and / or clinical management purposes  to differentiate between  SARS-CoV-2 and other Sarbecovirus currently known to infect humans.  If clinically indicated additional testing with an alternate test  methodology [id number]) is advised. The SARS-CoV-2 RNA is generally  detectable in upper and lower respiratory sp ecimens during the acute  phase of infection. The expected result is Negative. Fact Sheet for Patients:  StrictlyIdeas.no Fact Sheet for Healthcare  Providers: BankingDealers.co.za This test is not yet approved or cleared by the [place] FDA and has been authorized for detection and/or diagnosis of SARS-CoV-2 by FDA under an Emergency Use Authorization (EUA).  This EUA will remain in effect (meaning this test can be used) for the duration of the COVID-19 declaration under Section 564(b)(1)  of the Act, 21 U.S.C. section 360bbb-3(b)(1), unless the authorization is terminated or revoked sooner. Performed at [facility], [place] [address], [place], [place] [address]   Culture, sputum-assessment     Status: None   Collection Time: 08/20/19  7:54 AM   Specimen: Sputum  Result Value Ref Range Status   Specimen Description SPUTUM  Final   Special Requests NONE  Final   Sputum evaluation   Final    THIS SPECIMEN IS ACCEPTABLE FOR SPUTUM CULTURE Performed at [facility], [address] [place] [address], [place], [place] [address]    Report Status 08/21/2019 FINAL  Final  Culture, respiratory     Status: None   Collection Time: 08/20/19  7:54 AM   Specimen: SPU  Result Value Ref Range Status   Specimen Description SPUTUM  Final   Special Requests NONE Reflexed from [id number]  Final   Gram Stain   Final    MODERATE WBC PRESENT,BOTH PMN AND MONONUCLEAR FEW GRAM POSITIVE COCCI IN PAIRS FEW GRAM POSITIVE RODS RARE BUDDING YEAST SEEN    Culture   Final    Consistent with normal respiratory flora. Performed at [facility], [place] [address], [place], [place] [address]    Report Status 08/23/2019 FINAL  Final  MRSA PCR Screening     Status: None   Collection Time: 08/20/19  1:12 PM   Specimen: Nasopharyngeal  Result Value Ref Range Status   MRSA by PCR NEGATIVE NEGATIVE Final    Comment:        The GeneXpert MRSA Assay (FDA approved for NASAL specimens only), is one component of a comprehensive MRSA colonization surveillance program. It is not intended to diagnose MRSA infection nor to guide or monitor  treatment for MRSA infections. Performed at [facility], [place] [address], [place], [place] [address]      Labs: Basic Metabolic Panel: Recent Labs  Lab 08/20/19 0524 08/21/19 0152 08/22/19 1502 08/23/19 0328 08/24/19 0414  NA 139 138 140 137 137  K 4.3 4.8 5.8* 5.5* 3.9  CL 108 112* 108 110 109  CO2 19* 18* 19* 18* 21*  GLUCOSE 118* 122* 107* 146* 124*  BUN 30* 29* 37* 39* 37*  CREATININE 1.85* 1.52* 1.47* 1.37* 1.25*  CALCIUM 8.8* 8.5* 9.2 8.8* 8.3*   Liver Function Tests: Recent Labs  Lab 08/20/19 0524  AST 63*  ALT 40  ALKPHOS 186*  BILITOT 0.8  PROT 7.4  ALBUMIN 3.3*   No results for input(s): LIPASE, AMYLASE in the last 168 hours. No results for input(s): AMMONIA in the last 168 hours. CBC: Recent Labs  Lab 08/20/19 0524 08/21/19 0152 08/22/19 1502 08/23/19 0328 08/24/19 0414  WBC 12.9* 12.4* 9.7 7.6 5.8  NEUTROABS 10.8*  --  8.2* 6.4 4.5  HGB 11.2* 9.7* 11.0* 10.5* 10.2*  HCT 35.8* 29.7* 35.5* 33.4* 32.2*  MCV 100.3* 96.4 101.1* 99.1 98.5  PLT 213 171 224 210 214   Cardiac Enzymes: No results for input(s): CKTOTAL, CKMB, CKMBINDEX, TROPONINI in the last 168 hours. BNP: BNP (last 3 results) Recent Labs    08/20/19 0524  BNP 210.5*    ProBNP (last 3 results) No results for input(s): PROBNP in the last 8760 hours.  CBG: No results for input(s): GLUCAP in the last 168 hours.     Signed:  Alma Friendly, MD [facility] 08/24/2019, 10:36 AM

## 2019-08-24 NOTE — Progress Notes (Signed)
SLP Cancellation Note  Patient Details Name: Lively Behar MRN: [id number] DOB: [birth year]   Cancelled treatment:       Reason Eval/Treat Not Completed: Other (comment)(imminent discharge, RN going over discharge paperwork, pt briefly observed eating and drinking presentations of her currently prescribed diet with no overt s/s of aspiration, informed pt to reach out for speech therapy services should she have increased difficulty swallowing in the future.)   Maleyah Evans, Selinda Orion 08/24/2019, 3:18 PM

## 2019-08-24 NOTE — TOC Transition Note (Signed)
Transition of Care [facility]) - CM/SW Discharge Note   Patient Details  Name: Lisa Cortez MRN: [id number] Date of Birth: 1925/11/30  [facility]) CM/SW Contact:  Bethena Roys, RN Phone Number: 08/24/2019, 1:56 PM   Clinical Narrative:  Patient planned to return to [facility]. Patient agreeable to DME Oxygen via  [employer] which has been delivered. [place] Services will be provided by [facility]. SOC to begin on 08-28-19. Patient and daughter both agreeable of start of care date. Staff RN aware that patient is ready to transition home. No further needs from CM at this time.    Final next level of care: [place] Barriers to Discharge: No Barriers Identified   Patient Goals and CMS Choice Patient states their goals for this hospitalization and ongoing recovery are:: Return to her apartment CMS Medicare.gov Compare Post Acute Care list provided to:: Patient Choice offered to / list presented to : Patient   Discharge Plan and Services In-house Referral: Clinical Social Work Discharge Planning Services: CM Consult Post Acute Care Choice: NA          DME Arranged: Oxygen DME Agency: [employer] Date DME Agency Contacted: 08/24/19 Time DME Agency Contacted: 53   [place]: [place] Date [place]: 08/24/19 Time [place]: [id number] Representative spoke with at [place]: [place] (New Home) Interventions     Readmission Risk Interventions Readmission Risk Prevention Plan 08/24/2019  Transportation Screening Complete  HRI or [place] Complete  Social Work Consult for [place] Planning/Counseling Complete  Palliative Care Screening Not Applicable  Medication Review Press photographer) Complete

## 2019-08-24 NOTE — Progress Notes (Signed)
Nsg Discharge Note  Patient being transported to [place] by daughter.  Admit Date:  08/20/2019 Discharge date: 08/24/2019   Katheran James to be D/C'd Home per MD order.  AVS completed.  Copy for chart, and copy for patient signed, and dated. Patient/caregiver able to verbalize understanding.  Discharge Medication: Allergies as of 08/24/2019      Reactions   Tenex [guanfacine Hcl] Other (See Comments)   unknown   Calcitonin Other (See Comments)   unknown   Fosamax [alendronate Sodium] Other (See Comments)   unknown   Gatifloxacin Other (See Comments)   unknown   Procrit [epoetin (alfa)] Other (See Comments)   unknown   Verapamil Other (See Comments)   unknown      Medication List    TAKE these medications   albuterol 108 (90 Base) MCG/ACT inhaler Commonly known as: VENTOLIN HFA Inhale 2 puffs into the lungs every 4 (four) hours as needed.   amLODipine 5 MG tablet Commonly known as: NORVASC Take 5 mg by mouth at bedtime.   aspirin EC 81 MG tablet Take 81 mg by mouth daily.   atorvastatin 10 MG tablet Commonly known as: LIPITOR Take 10 mg by mouth at bedtime.   azithromycin 500 MG tablet Commonly known as: Zithromax Take 1 tablet (500 mg total) by mouth daily for 2 days.   B-12 2000 MCG Tabs Take 1,000 mcg by mouth daily.   cefdinir 300 MG capsule Commonly known as: OMNICEF Take 1 capsule (300 mg total) by mouth 2 (two) times daily for 2 days.   Fluticasone-Salmeterol 250-50 MCG/DOSE Aepb Commonly known as: ADVAIR Inhale 1 puff into the lungs 2 (two) times daily.   guaiFENesin 600 MG 12 hr tablet Commonly known as: MUCINEX Take 1 tablet (600 mg total) by mouth 2 (two) times daily as needed for up to 5 days for cough.   levothyroxine 75 MCG tablet Commonly known as: SYNTHROID Take 75 mcg by mouth daily.   memantine 5 MG tablet Commonly known as: NAMENDA Take 5 mg by mouth at bedtime.   metoprolol tartrate 25 MG tablet Commonly known as: LOPRESSOR Take 25 mg  by mouth 2 (two) times daily.   montelukast 10 MG tablet Commonly known as: SINGULAIR Take 10 mg by mouth daily.   omeprazole 20 MG capsule Commonly known as: PRILOSEC Take 20 mg by mouth daily.   OVER THE COUNTER MEDICATION Take 5 mLs by mouth daily.   pramipexole 0.25 MG tablet Commonly known as: MIRAPEX Take 0.25 mg by mouth at bedtime.   predniSONE 10 MG tablet Commonly known as: DELTASONE Take 4 tabs daily for 1 day, 3 tabs daily for 2 days, 2 tabs daily for 2 days, 1 tab daily for 2 days and complete   sodium bicarbonate 650 MG tablet Take 650 mg by mouth 2 (two) times daily.            Durable Medical Equipment  (From admission, onward)         Start     Ordered   08/24/19 1017  For home use only DME oxygen  Once    Question Answer Comment  Length of Need 6 Months   Mode or (Route) Nasal cannula   Liters per Minute 2   Frequency Continuous (stationary and portable oxygen unit needed)   Oxygen delivery system Gas      08/24/19 1017          Discharge Assessment: Vitals:   08/24/19 0716 08/24/19 0717  BP:  Pulse:    Resp:    Temp:    SpO2: 98% 98%   Skin clean, dry and intact without evidence of skin break down, no evidence of skin tears noted. IV catheter discontinued intact. Site without signs and symptoms of complications - no redness or edema noted at insertion site, patient denies c/o pain - only slight tenderness at site.  Dressing with slight pressure applied.  D/c Instructions-Education: Discharge instructions given to patient/family with verbalized understanding. D/c education completed with patient/family including follow up instructions, medication list, d/c activities limitations if indicated, with other d/c instructions as indicated by MD - patient able to verbalize understanding, all questions fully answered. Patient instructed to return to ED, call 911, or call MD for any changes in condition.  Patient escorted via [place], and D/C home  via private auto.  Erasmo Leventhal, RN 08/24/2019 4:34 PM

## 2019-08-24 NOTE — TOC Transition Note (Signed)
Transition of Care [facility]) - CM/SW Discharge Note   Patient Details  Name: Lisa Cortez MRN: [id number] Date of Birth: 14-Feb-1925  Transition of Care [facility]) CM/SW Contact:  Bethena Roys, RN Phone Number: 08/24/2019, 1:26 PM   Clinical Narrative:  Pt presented for Sepsis- PTA from IDL facility at [facility]. Plan will be to return to [facility] with DME 02 and [facility]. CM did speak with patient and daughter at the bedside. Patient is agreeable to home oxygen and she wants to use [employer]. DME order placed with Jeneen Rinks with [employer] and will be delivered to room prior to transition home. HH PT order placed and order sent to [facility]- unable to accept at this time. Referral sent to [facility] and awaiting call back. CM did speak with Loa Socks with [facility] and [facility] can assist with a $35.00 co pay per visit. CM will continue to follow.   Final next level of care: [place] Barriers to Discharge: No Barriers Identified   Patient Goals and CMS Choice Patient states their goals for this hospitalization and ongoing recovery are:: Return to her apartment CMS Medicare.gov Compare Post Acute Care list provided to:: Patient Choice offered to / list presented to : Patient   Discharge Plan and Services In-house Referral: Clinical Social Work Discharge Planning Services: CM Consult Post Acute Care Choice: NA          DME Arranged: Oxygen DME Agency: [employer] Date DME Agency Contacted: 08/24/19 Time DME Agency Contacted: 44   HH Arranged: PT   Readmission Risk Interventions Readmission Risk Prevention Plan 08/24/2019  Transportation Screening Complete

## 2019-08-25 LAB — CULTURE, BLOOD (ROUTINE X 2)
Culture: NO GROWTH
Culture: NO GROWTH

## 2019-08-30 DIAGNOSIS — K121 Other forms of stomatitis: Secondary | ICD-10-CM | POA: Diagnosis not present

## 2019-08-30 DIAGNOSIS — G301 Alzheimer's disease with late onset: Secondary | ICD-10-CM | POA: Diagnosis not present

## 2019-08-30 DIAGNOSIS — J441 Chronic obstructive pulmonary disease with (acute) exacerbation: Secondary | ICD-10-CM | POA: Diagnosis not present

## 2019-08-30 DIAGNOSIS — I129 Hypertensive chronic kidney disease with stage 1 through stage 4 chronic kidney disease, or unspecified chronic kidney disease: Secondary | ICD-10-CM | POA: Diagnosis not present

## 2019-08-30 DIAGNOSIS — J9601 Acute respiratory failure with hypoxia: Secondary | ICD-10-CM | POA: Diagnosis not present

## 2019-08-31 DIAGNOSIS — J9621 Acute and chronic respiratory failure with hypoxia: Secondary | ICD-10-CM | POA: Diagnosis not present

## 2019-08-31 DIAGNOSIS — E039 Hypothyroidism, unspecified: Secondary | ICD-10-CM | POA: Diagnosis not present

## 2019-08-31 DIAGNOSIS — R69 Illness, unspecified: Secondary | ICD-10-CM | POA: Diagnosis not present

## 2019-08-31 DIAGNOSIS — I129 Hypertensive chronic kidney disease with stage 1 through stage 4 chronic kidney disease, or unspecified chronic kidney disease: Secondary | ICD-10-CM | POA: Diagnosis not present

## 2019-08-31 DIAGNOSIS — N184 Chronic kidney disease, stage 4 (severe): Secondary | ICD-10-CM | POA: Diagnosis not present

## 2019-08-31 DIAGNOSIS — J440 Chronic obstructive pulmonary disease with acute lower respiratory infection: Secondary | ICD-10-CM | POA: Diagnosis not present

## 2019-08-31 DIAGNOSIS — D631 Anemia in chronic kidney disease: Secondary | ICD-10-CM | POA: Diagnosis not present

## 2019-08-31 DIAGNOSIS — J441 Chronic obstructive pulmonary disease with (acute) exacerbation: Secondary | ICD-10-CM | POA: Diagnosis not present

## 2019-08-31 DIAGNOSIS — J189 Pneumonia, unspecified organism: Secondary | ICD-10-CM | POA: Diagnosis not present

## 2019-08-31 DIAGNOSIS — E538 Deficiency of other specified B group vitamins: Secondary | ICD-10-CM | POA: Diagnosis not present

## 2019-08-31 NOTE — Progress Notes (Signed)
Received message to call patient's daughter, Hilda Blades, at [phone]. Hilda Blades stated that her mother was discharged last Friday a week ago with an oxygen tank from [employer]. [employer] delivered a larger tank (??concentrator), but Hilda Blades states it is too big and her mother needs to be able to keep the smaller tanks. She was advised by [employer] that the order needed to be changed. Hilda Blades advised by CM that orders now needed to go through PCP since patient had been out of hospital for a week. CM provided daughter with [employer] Referral Support line - [phone].   Manya Silvas, RN CM Transitions of Care [phone]

## 2019-09-04 DIAGNOSIS — I129 Hypertensive chronic kidney disease with stage 1 through stage 4 chronic kidney disease, or unspecified chronic kidney disease: Secondary | ICD-10-CM | POA: Diagnosis not present

## 2019-09-04 DIAGNOSIS — N184 Chronic kidney disease, stage 4 (severe): Secondary | ICD-10-CM | POA: Diagnosis not present

## 2019-09-04 DIAGNOSIS — J189 Pneumonia, unspecified organism: Secondary | ICD-10-CM | POA: Diagnosis not present

## 2019-09-04 DIAGNOSIS — E538 Deficiency of other specified B group vitamins: Secondary | ICD-10-CM | POA: Diagnosis not present

## 2019-09-04 DIAGNOSIS — J440 Chronic obstructive pulmonary disease with acute lower respiratory infection: Secondary | ICD-10-CM | POA: Diagnosis not present

## 2019-09-04 DIAGNOSIS — R69 Illness, unspecified: Secondary | ICD-10-CM | POA: Diagnosis not present

## 2019-09-04 DIAGNOSIS — J9621 Acute and chronic respiratory failure with hypoxia: Secondary | ICD-10-CM | POA: Diagnosis not present

## 2019-09-04 DIAGNOSIS — E039 Hypothyroidism, unspecified: Secondary | ICD-10-CM | POA: Diagnosis not present

## 2019-09-04 DIAGNOSIS — J441 Chronic obstructive pulmonary disease with (acute) exacerbation: Secondary | ICD-10-CM | POA: Diagnosis not present

## 2019-09-04 DIAGNOSIS — D631 Anemia in chronic kidney disease: Secondary | ICD-10-CM | POA: Diagnosis not present

## 2019-09-07 DIAGNOSIS — J189 Pneumonia, unspecified organism: Secondary | ICD-10-CM | POA: Diagnosis not present

## 2019-09-07 DIAGNOSIS — E538 Deficiency of other specified B group vitamins: Secondary | ICD-10-CM | POA: Diagnosis not present

## 2019-09-07 DIAGNOSIS — D631 Anemia in chronic kidney disease: Secondary | ICD-10-CM | POA: Diagnosis not present

## 2019-09-07 DIAGNOSIS — I129 Hypertensive chronic kidney disease with stage 1 through stage 4 chronic kidney disease, or unspecified chronic kidney disease: Secondary | ICD-10-CM | POA: Diagnosis not present

## 2019-09-07 DIAGNOSIS — J9621 Acute and chronic respiratory failure with hypoxia: Secondary | ICD-10-CM | POA: Diagnosis not present

## 2019-09-07 DIAGNOSIS — J440 Chronic obstructive pulmonary disease with acute lower respiratory infection: Secondary | ICD-10-CM | POA: Diagnosis not present

## 2019-09-07 DIAGNOSIS — N184 Chronic kidney disease, stage 4 (severe): Secondary | ICD-10-CM | POA: Diagnosis not present

## 2019-09-07 DIAGNOSIS — J441 Chronic obstructive pulmonary disease with (acute) exacerbation: Secondary | ICD-10-CM | POA: Diagnosis not present

## 2019-09-07 DIAGNOSIS — R69 Illness, unspecified: Secondary | ICD-10-CM | POA: Diagnosis not present

## 2019-09-07 DIAGNOSIS — E039 Hypothyroidism, unspecified: Secondary | ICD-10-CM | POA: Diagnosis not present

## 2019-09-11 DIAGNOSIS — E039 Hypothyroidism, unspecified: Secondary | ICD-10-CM | POA: Diagnosis not present

## 2019-09-11 DIAGNOSIS — R69 Illness, unspecified: Secondary | ICD-10-CM | POA: Diagnosis not present

## 2019-09-11 DIAGNOSIS — J9621 Acute and chronic respiratory failure with hypoxia: Secondary | ICD-10-CM | POA: Diagnosis not present

## 2019-09-11 DIAGNOSIS — D631 Anemia in chronic kidney disease: Secondary | ICD-10-CM | POA: Diagnosis not present

## 2019-09-11 DIAGNOSIS — J440 Chronic obstructive pulmonary disease with acute lower respiratory infection: Secondary | ICD-10-CM | POA: Diagnosis not present

## 2019-09-11 DIAGNOSIS — N184 Chronic kidney disease, stage 4 (severe): Secondary | ICD-10-CM | POA: Diagnosis not present

## 2019-09-11 DIAGNOSIS — J441 Chronic obstructive pulmonary disease with (acute) exacerbation: Secondary | ICD-10-CM | POA: Diagnosis not present

## 2019-09-11 DIAGNOSIS — I129 Hypertensive chronic kidney disease with stage 1 through stage 4 chronic kidney disease, or unspecified chronic kidney disease: Secondary | ICD-10-CM | POA: Diagnosis not present

## 2019-09-11 DIAGNOSIS — J189 Pneumonia, unspecified organism: Secondary | ICD-10-CM | POA: Diagnosis not present

## 2019-09-11 DIAGNOSIS — E538 Deficiency of other specified B group vitamins: Secondary | ICD-10-CM | POA: Diagnosis not present

## 2019-09-13 DIAGNOSIS — J189 Pneumonia, unspecified organism: Secondary | ICD-10-CM | POA: Diagnosis not present

## 2019-09-13 DIAGNOSIS — N184 Chronic kidney disease, stage 4 (severe): Secondary | ICD-10-CM | POA: Diagnosis not present

## 2019-09-13 DIAGNOSIS — D631 Anemia in chronic kidney disease: Secondary | ICD-10-CM | POA: Diagnosis not present

## 2019-09-13 DIAGNOSIS — J9621 Acute and chronic respiratory failure with hypoxia: Secondary | ICD-10-CM | POA: Diagnosis not present

## 2019-09-13 DIAGNOSIS — E538 Deficiency of other specified B group vitamins: Secondary | ICD-10-CM | POA: Diagnosis not present

## 2019-09-13 DIAGNOSIS — R69 Illness, unspecified: Secondary | ICD-10-CM | POA: Diagnosis not present

## 2019-09-13 DIAGNOSIS — J441 Chronic obstructive pulmonary disease with (acute) exacerbation: Secondary | ICD-10-CM | POA: Diagnosis not present

## 2019-09-13 DIAGNOSIS — E039 Hypothyroidism, unspecified: Secondary | ICD-10-CM | POA: Diagnosis not present

## 2019-09-13 DIAGNOSIS — I129 Hypertensive chronic kidney disease with stage 1 through stage 4 chronic kidney disease, or unspecified chronic kidney disease: Secondary | ICD-10-CM | POA: Diagnosis not present

## 2019-09-13 DIAGNOSIS — J440 Chronic obstructive pulmonary disease with acute lower respiratory infection: Secondary | ICD-10-CM | POA: Diagnosis not present

## 2019-09-18 DIAGNOSIS — J440 Chronic obstructive pulmonary disease with acute lower respiratory infection: Secondary | ICD-10-CM | POA: Diagnosis not present

## 2019-09-18 DIAGNOSIS — N184 Chronic kidney disease, stage 4 (severe): Secondary | ICD-10-CM | POA: Diagnosis not present

## 2019-09-18 DIAGNOSIS — R69 Illness, unspecified: Secondary | ICD-10-CM | POA: Diagnosis not present

## 2019-09-18 DIAGNOSIS — D631 Anemia in chronic kidney disease: Secondary | ICD-10-CM | POA: Diagnosis not present

## 2019-09-18 DIAGNOSIS — I129 Hypertensive chronic kidney disease with stage 1 through stage 4 chronic kidney disease, or unspecified chronic kidney disease: Secondary | ICD-10-CM | POA: Diagnosis not present

## 2019-09-18 DIAGNOSIS — J9621 Acute and chronic respiratory failure with hypoxia: Secondary | ICD-10-CM | POA: Diagnosis not present

## 2019-09-18 DIAGNOSIS — E538 Deficiency of other specified B group vitamins: Secondary | ICD-10-CM | POA: Diagnosis not present

## 2019-09-18 DIAGNOSIS — J189 Pneumonia, unspecified organism: Secondary | ICD-10-CM | POA: Diagnosis not present

## 2019-09-18 DIAGNOSIS — E039 Hypothyroidism, unspecified: Secondary | ICD-10-CM | POA: Diagnosis not present

## 2019-09-18 DIAGNOSIS — J441 Chronic obstructive pulmonary disease with (acute) exacerbation: Secondary | ICD-10-CM | POA: Diagnosis not present

## 2019-09-20 DIAGNOSIS — R69 Illness, unspecified: Secondary | ICD-10-CM | POA: Diagnosis not present

## 2019-09-20 DIAGNOSIS — D631 Anemia in chronic kidney disease: Secondary | ICD-10-CM | POA: Diagnosis not present

## 2019-09-20 DIAGNOSIS — J9621 Acute and chronic respiratory failure with hypoxia: Secondary | ICD-10-CM | POA: Diagnosis not present

## 2019-09-20 DIAGNOSIS — N184 Chronic kidney disease, stage 4 (severe): Secondary | ICD-10-CM | POA: Diagnosis not present

## 2019-09-20 DIAGNOSIS — J440 Chronic obstructive pulmonary disease with acute lower respiratory infection: Secondary | ICD-10-CM | POA: Diagnosis not present

## 2019-09-20 DIAGNOSIS — J189 Pneumonia, unspecified organism: Secondary | ICD-10-CM | POA: Diagnosis not present

## 2019-09-20 DIAGNOSIS — J441 Chronic obstructive pulmonary disease with (acute) exacerbation: Secondary | ICD-10-CM | POA: Diagnosis not present

## 2019-09-20 DIAGNOSIS — E538 Deficiency of other specified B group vitamins: Secondary | ICD-10-CM | POA: Diagnosis not present

## 2019-09-20 DIAGNOSIS — E039 Hypothyroidism, unspecified: Secondary | ICD-10-CM | POA: Diagnosis not present

## 2019-09-20 DIAGNOSIS — I129 Hypertensive chronic kidney disease with stage 1 through stage 4 chronic kidney disease, or unspecified chronic kidney disease: Secondary | ICD-10-CM | POA: Diagnosis not present

## 2019-09-25 DIAGNOSIS — J9621 Acute and chronic respiratory failure with hypoxia: Secondary | ICD-10-CM | POA: Diagnosis not present

## 2019-09-25 DIAGNOSIS — I129 Hypertensive chronic kidney disease with stage 1 through stage 4 chronic kidney disease, or unspecified chronic kidney disease: Secondary | ICD-10-CM | POA: Diagnosis not present

## 2019-09-25 DIAGNOSIS — N184 Chronic kidney disease, stage 4 (severe): Secondary | ICD-10-CM | POA: Diagnosis not present

## 2019-09-25 DIAGNOSIS — J441 Chronic obstructive pulmonary disease with (acute) exacerbation: Secondary | ICD-10-CM | POA: Diagnosis not present

## 2019-09-25 DIAGNOSIS — D631 Anemia in chronic kidney disease: Secondary | ICD-10-CM | POA: Diagnosis not present

## 2019-09-25 DIAGNOSIS — E039 Hypothyroidism, unspecified: Secondary | ICD-10-CM | POA: Diagnosis not present

## 2019-09-25 DIAGNOSIS — J440 Chronic obstructive pulmonary disease with acute lower respiratory infection: Secondary | ICD-10-CM | POA: Diagnosis not present

## 2019-09-25 DIAGNOSIS — J189 Pneumonia, unspecified organism: Secondary | ICD-10-CM | POA: Diagnosis not present

## 2019-09-25 DIAGNOSIS — E538 Deficiency of other specified B group vitamins: Secondary | ICD-10-CM | POA: Diagnosis not present

## 2019-09-25 DIAGNOSIS — R69 Illness, unspecified: Secondary | ICD-10-CM | POA: Diagnosis not present

## 2019-09-27 DIAGNOSIS — E039 Hypothyroidism, unspecified: Secondary | ICD-10-CM | POA: Diagnosis not present

## 2019-09-27 DIAGNOSIS — D631 Anemia in chronic kidney disease: Secondary | ICD-10-CM | POA: Diagnosis not present

## 2019-09-27 DIAGNOSIS — J9621 Acute and chronic respiratory failure with hypoxia: Secondary | ICD-10-CM | POA: Diagnosis not present

## 2019-09-27 DIAGNOSIS — I129 Hypertensive chronic kidney disease with stage 1 through stage 4 chronic kidney disease, or unspecified chronic kidney disease: Secondary | ICD-10-CM | POA: Diagnosis not present

## 2019-09-27 DIAGNOSIS — N184 Chronic kidney disease, stage 4 (severe): Secondary | ICD-10-CM | POA: Diagnosis not present

## 2019-09-27 DIAGNOSIS — E538 Deficiency of other specified B group vitamins: Secondary | ICD-10-CM | POA: Diagnosis not present

## 2019-09-27 DIAGNOSIS — R69 Illness, unspecified: Secondary | ICD-10-CM | POA: Diagnosis not present

## 2019-09-27 DIAGNOSIS — J441 Chronic obstructive pulmonary disease with (acute) exacerbation: Secondary | ICD-10-CM | POA: Diagnosis not present

## 2019-09-27 DIAGNOSIS — J440 Chronic obstructive pulmonary disease with acute lower respiratory infection: Secondary | ICD-10-CM | POA: Diagnosis not present

## 2019-09-27 DIAGNOSIS — J189 Pneumonia, unspecified organism: Secondary | ICD-10-CM | POA: Diagnosis not present

## 2020-07-09 ENCOUNTER — Encounter (HOSPITAL_COMMUNITY): Payer: Self-pay

## 2020-07-09 ENCOUNTER — Emergency Department (HOSPITAL_COMMUNITY): Payer: Medicare Other

## 2020-07-09 ENCOUNTER — Inpatient Hospital Stay (HOSPITAL_COMMUNITY)
Admission: EM | Admit: 2020-07-09 | Discharge: 2020-07-15 | DRG: 177 | Disposition: A | Discharge status: Skilled Nursing Facility | Payer: Medicare Other | Attending: Internal Medicine | Admitting: Internal Medicine

## 2020-07-09 DIAGNOSIS — Z809 Family history of malignant neoplasm, unspecified: Secondary | ICD-10-CM | POA: Diagnosis not present

## 2020-07-09 DIAGNOSIS — Z823 Family history of stroke: Secondary | ICD-10-CM | POA: Diagnosis not present

## 2020-07-09 DIAGNOSIS — J440 Chronic obstructive pulmonary disease with acute lower respiratory infection: Secondary | ICD-10-CM | POA: Diagnosis present

## 2020-07-09 DIAGNOSIS — J441 Chronic obstructive pulmonary disease with (acute) exacerbation: Secondary | ICD-10-CM | POA: Diagnosis present

## 2020-07-09 DIAGNOSIS — D62 Acute posthemorrhagic anemia: Secondary | ICD-10-CM | POA: Diagnosis not present

## 2020-07-09 DIAGNOSIS — I10 Essential (primary) hypertension: Secondary | ICD-10-CM | POA: Diagnosis not present

## 2020-07-09 DIAGNOSIS — Z87891 Personal history of nicotine dependence: Secondary | ICD-10-CM

## 2020-07-09 DIAGNOSIS — Z79890 Hormone replacement therapy (postmenopausal): Secondary | ICD-10-CM

## 2020-07-09 DIAGNOSIS — R0602 Shortness of breath: Secondary | ICD-10-CM | POA: Diagnosis not present

## 2020-07-09 DIAGNOSIS — Z0184 Encounter for antibody response examination: Secondary | ICD-10-CM | POA: Diagnosis not present

## 2020-07-09 DIAGNOSIS — J9601 Acute respiratory failure with hypoxia: Secondary | ICD-10-CM | POA: Diagnosis present

## 2020-07-09 DIAGNOSIS — Z881 Allergy status to other antibiotic agents status: Secondary | ICD-10-CM | POA: Diagnosis not present

## 2020-07-09 DIAGNOSIS — R7401 Elevation of levels of liver transaminase levels: Secondary | ICD-10-CM | POA: Diagnosis not present

## 2020-07-09 DIAGNOSIS — E039 Hypothyroidism, unspecified: Secondary | ICD-10-CM | POA: Diagnosis present

## 2020-07-09 DIAGNOSIS — D649 Anemia, unspecified: Secondary | ICD-10-CM | POA: Diagnosis not present

## 2020-07-09 DIAGNOSIS — I214 Non-ST elevation (NSTEMI) myocardial infarction: Secondary | ICD-10-CM | POA: Diagnosis present

## 2020-07-09 DIAGNOSIS — I130 Hypertensive heart and chronic kidney disease with heart failure and stage 1 through stage 4 chronic kidney disease, or unspecified chronic kidney disease: Secondary | ICD-10-CM | POA: Diagnosis present

## 2020-07-09 DIAGNOSIS — N179 Acute kidney failure, unspecified: Secondary | ICD-10-CM | POA: Diagnosis present

## 2020-07-09 DIAGNOSIS — N184 Chronic kidney disease, stage 4 (severe): Secondary | ICD-10-CM | POA: Diagnosis present

## 2020-07-09 DIAGNOSIS — I5033 Acute on chronic diastolic (congestive) heart failure: Secondary | ICD-10-CM | POA: Diagnosis present

## 2020-07-09 DIAGNOSIS — K921 Melena: Secondary | ICD-10-CM | POA: Diagnosis not present

## 2020-07-09 DIAGNOSIS — I5189 Other ill-defined heart diseases: Secondary | ICD-10-CM

## 2020-07-09 DIAGNOSIS — D631 Anemia in chronic kidney disease: Secondary | ICD-10-CM | POA: Diagnosis present

## 2020-07-09 DIAGNOSIS — J13 Pneumonia due to Streptococcus pneumoniae: Secondary | ICD-10-CM | POA: Diagnosis present

## 2020-07-09 DIAGNOSIS — Z79899 Other long term (current) drug therapy: Secondary | ICD-10-CM

## 2020-07-09 DIAGNOSIS — U071 COVID-19: Secondary | ICD-10-CM | POA: Diagnosis present

## 2020-07-09 DIAGNOSIS — E785 Hyperlipidemia, unspecified: Secondary | ICD-10-CM | POA: Diagnosis present

## 2020-07-09 DIAGNOSIS — Z7982 Long term (current) use of aspirin: Secondary | ICD-10-CM | POA: Diagnosis not present

## 2020-07-09 DIAGNOSIS — I2510 Atherosclerotic heart disease of native coronary artery without angina pectoris: Secondary | ICD-10-CM | POA: Diagnosis present

## 2020-07-09 DIAGNOSIS — A491 Streptococcal infection, unspecified site: Secondary | ICD-10-CM

## 2020-07-09 DIAGNOSIS — Z7951 Long term (current) use of inhaled steroids: Secondary | ICD-10-CM | POA: Diagnosis not present

## 2020-07-09 DIAGNOSIS — G2581 Restless legs syndrome: Secondary | ICD-10-CM | POA: Diagnosis present

## 2020-07-09 DIAGNOSIS — Z66 Do not resuscitate: Secondary | ICD-10-CM | POA: Diagnosis present

## 2020-07-09 DIAGNOSIS — I959 Hypotension, unspecified: Secondary | ICD-10-CM | POA: Diagnosis not present

## 2020-07-09 DIAGNOSIS — J069 Acute upper respiratory infection, unspecified: Secondary | ICD-10-CM | POA: Diagnosis not present

## 2020-07-09 DIAGNOSIS — E8809 Other disorders of plasma-protein metabolism, not elsewhere classified: Secondary | ICD-10-CM | POA: Diagnosis present

## 2020-07-09 DIAGNOSIS — R339 Retention of urine, unspecified: Secondary | ICD-10-CM | POA: Diagnosis not present

## 2020-07-09 DIAGNOSIS — Z955 Presence of coronary angioplasty implant and graft: Secondary | ICD-10-CM

## 2020-07-09 DIAGNOSIS — Z888 Allergy status to other drugs, medicaments and biological substances status: Secondary | ICD-10-CM

## 2020-07-09 HISTORY — DX: Nonrheumatic aortic (valve) insufficiency: I35.1

## 2020-07-09 HISTORY — DX: Non-ST elevation (NSTEMI) myocardial infarction: I21.4

## 2020-07-09 HISTORY — DX: Other ill-defined heart diseases: I51.89

## 2020-07-09 HISTORY — DX: Chronic obstructive pulmonary disease, unspecified: J44.9

## 2020-07-09 HISTORY — DX: Chronic kidney disease, unspecified: N18.9

## 2020-07-09 HISTORY — DX: Nonrheumatic aortic (valve) stenosis: I35.0

## 2020-07-09 HISTORY — DX: Anemia in chronic kidney disease: D63.1

## 2020-07-09 LAB — CBC WITH DIFFERENTIAL/PLATELET
Abs Immature Granulocytes: 0.03 10*3/uL (ref 0.00–0.07)
Basophils Absolute: 0 10*3/uL (ref 0.0–0.1)
Basophils Relative: 0 %
Eosinophils Absolute: 0 10*3/uL (ref 0.0–0.5)
Eosinophils Relative: 0 %
HCT: 33.4 % — ABNORMAL LOW (ref 36.0–46.0)
Hemoglobin: 10.3 g/dL — ABNORMAL LOW (ref 12.0–15.0)
Immature Granulocytes: 0 %
Lymphocytes Relative: 19 %
Lymphs Abs: 1.4 10*3/uL (ref 0.7–4.0)
MCH: 30.1 pg (ref 26.0–34.0)
MCHC: 30.8 g/dL (ref 30.0–36.0)
MCV: 97.7 fL (ref 80.0–100.0)
Monocytes Absolute: 1.3 10*3/uL — ABNORMAL HIGH (ref 0.1–1.0)
Monocytes Relative: 17 %
Neutro Abs: 4.6 10*3/uL (ref 1.7–7.7)
Neutrophils Relative %: 64 %
Platelets: 186 10*3/uL (ref 150–400)
RBC: 3.42 MIL/uL — ABNORMAL LOW (ref 3.87–5.11)
RDW: 14.4 % (ref 11.5–15.5)
WBC: 7.3 10*3/uL (ref 4.0–10.5)
nRBC: 0 % (ref 0.0–0.2)

## 2020-07-09 LAB — COMPREHENSIVE METABOLIC PANEL
ALT: 67 U/L — ABNORMAL HIGH (ref 0–44)
AST: 173 U/L — ABNORMAL HIGH (ref 15–41)
Albumin: 3 g/dL — ABNORMAL LOW (ref 3.5–5.0)
Alkaline Phosphatase: 185 U/L — ABNORMAL HIGH (ref 38–126)
Anion gap: 13 (ref 5–15)
BUN: 35 mg/dL — ABNORMAL HIGH (ref 8–23)
CO2: 17 mmol/L — ABNORMAL LOW (ref 22–32)
Calcium: 8.3 mg/dL — ABNORMAL LOW (ref 8.9–10.3)
Chloride: 107 mmol/L (ref 98–111)
Creatinine, Ser: 1.79 mg/dL — ABNORMAL HIGH (ref 0.44–1.00)
GFR calc Af Amer: 28 mL/min — ABNORMAL LOW (ref >60)
GFR calc non Af Amer: 24 mL/min — ABNORMAL LOW (ref >60)
Glucose, Bld: 73 mg/dL (ref 70–99)
Potassium: 4.6 mmol/L (ref 3.5–5.1)
Sodium: 137 mmol/L (ref 135–145)
Total Bilirubin: 0.5 mg/dL (ref 0.3–1.2)
Total Protein: 7.4 g/dL (ref 6.5–8.1)

## 2020-07-09 LAB — LACTATE DEHYDROGENASE: LDH: 308 U/L — ABNORMAL HIGH (ref 98–192)

## 2020-07-09 LAB — D-DIMER, QUANTITATIVE: D-Dimer, Quant: 2.61 ug/mL-FEU — ABNORMAL HIGH (ref 0.00–0.50)

## 2020-07-09 LAB — FERRITIN: Ferritin: 51 ng/mL (ref 11–307)

## 2020-07-09 LAB — TRIGLYCERIDES: Triglycerides: 124 mg/dL (ref <150)

## 2020-07-09 LAB — PROCALCITONIN: Procalcitonin: 6.33 ng/mL

## 2020-07-09 LAB — C-REACTIVE PROTEIN: CRP: 4.2 mg/dL — ABNORMAL HIGH (ref <1.0)

## 2020-07-09 LAB — FIBRINOGEN: Fibrinogen: 372 mg/dL (ref 210–475)

## 2020-07-09 LAB — LACTIC ACID, PLASMA
Lactic Acid, Venous: 1.5 mmol/L (ref 0.5–1.9)
Lactic Acid, Venous: 1.1 mmol/L (ref 0.5–1.9)

## 2020-07-09 LAB — TROPONIN I (HIGH SENSITIVITY): Troponin I (High Sensitivity): 17855 ng/L (ref <18)

## 2020-07-09 LAB — APTT: aPTT: 29 seconds (ref 24–36)

## 2020-07-09 LAB — BRAIN NATRIURETIC PEPTIDE: B Natriuretic Peptide: 1188.5 pg/mL — ABNORMAL HIGH (ref 0.0–100.0)

## 2020-07-09 LAB — PROTIME-INR
INR: 1 (ref 0.8–1.2)
Prothrombin Time: 12.9 seconds (ref 11.4–15.2)

## 2020-07-09 LAB — SARS CORONAVIRUS 2 BY RT PCR (HOSPITAL ORDER, PERFORMED IN ~~LOC~~ HOSPITAL LAB): SARS Coronavirus 2: POSITIVE — AB

## 2020-07-09 MED ORDER — ASPIRIN 81 MG PO CHEW
324.0000 mg | CHEWABLE_TABLET | ORAL | Status: AC
  Administered 2020-07-09: 324 mg via ORAL
  Filled 2020-07-09: qty 4

## 2020-07-09 MED ORDER — ACETAMINOPHEN 325 MG PO TABS
650.0000 mg | ORAL_TABLET | Freq: Four times a day (QID) | ORAL | Status: DC | PRN
  Administered 2020-07-10 – 2020-07-15 (×9): 650 mg via ORAL
  Filled 2020-07-09 (×9): qty 2

## 2020-07-09 MED ORDER — SODIUM CHLORIDE 0.9 % IV BOLUS
500.0000 mL | Freq: Once | INTRAVENOUS | Status: AC
  Administered 2020-07-09: 500 mL via INTRAVENOUS

## 2020-07-09 MED ORDER — HEPARIN (PORCINE) 25000 UT/250ML-% IV SOLN
700.0000 [IU]/h | INTRAVENOUS | Status: DC
  Administered 2020-07-09: 700 [IU]/h via INTRAVENOUS
  Filled 2020-07-09: qty 250

## 2020-07-09 MED ORDER — METHYLPREDNISOLONE SODIUM SUCC 40 MG IJ SOLR
0.5000 mg/kg | Freq: Two times a day (BID) | INTRAMUSCULAR | Status: DC
  Administered 2020-07-10: 30.8 mg via INTRAVENOUS
  Filled 2020-07-09: qty 1

## 2020-07-09 MED ORDER — NITROGLYCERIN 0.4 MG SL SUBL: 0.4000 mg | SUBLINGUAL_TABLET | SUBLINGUAL | Status: DC | PRN

## 2020-07-09 MED ORDER — GUAIFENESIN-DM 100-10 MG/5ML PO SYRP
10.0000 mL | ORAL_SOLUTION | ORAL | Status: DC | PRN
  Administered 2020-07-10 – 2020-07-14 (×4): 10 mL via ORAL
  Filled 2020-07-09 (×4): qty 10

## 2020-07-09 MED ORDER — ONDANSETRON HCL 4 MG/2ML IJ SOLN
4.0000 mg | Freq: Four times a day (QID) | INTRAMUSCULAR | Status: DC | PRN
  Administered 2020-07-10: 4 mg via INTRAVENOUS
  Filled 2020-07-09: qty 2

## 2020-07-09 MED ORDER — ACETAMINOPHEN 650 MG RE SUPP: 650.0000 mg | Freq: Four times a day (QID) | RECTAL | Status: DC | PRN

## 2020-07-09 MED ORDER — ASPIRIN 300 MG RE SUPP
300.0000 mg | RECTAL | Status: AC
  Filled 2020-07-09: qty 1

## 2020-07-09 MED ORDER — SODIUM CHLORIDE 0.9 % IV SOLN
100.0000 mg | Freq: Every day | INTRAVENOUS | Status: AC
  Administered 2020-07-10 – 2020-07-13 (×4): 100 mg via INTRAVENOUS
  Filled 2020-07-09 (×4): qty 20

## 2020-07-09 MED ORDER — HYDROCOD POLST-CPM POLST ER 10-8 MG/5ML PO SUER
5.0000 mL | Freq: Two times a day (BID) | ORAL | Status: DC | PRN
  Administered 2020-07-10 – 2020-07-11 (×2): 5 mL via ORAL
  Filled 2020-07-09 (×2): qty 5

## 2020-07-09 MED ORDER — DEXAMETHASONE SODIUM PHOSPHATE 10 MG/ML IJ SOLN
10.0000 mg | Freq: Once | INTRAMUSCULAR | Status: AC
  Administered 2020-07-09: 10 mg via INTRAVENOUS
  Filled 2020-07-09: qty 1

## 2020-07-09 MED ORDER — SODIUM CHLORIDE 0.9 % IV SOLN
2.0000 g | INTRAVENOUS | Status: DC
  Administered 2020-07-09 – 2020-07-12 (×4): 2 g via INTRAVENOUS
  Filled 2020-07-09 (×5): qty 20

## 2020-07-09 MED ORDER — ZINC SULFATE 220 (50 ZN) MG PO CAPS
220.0000 mg | ORAL_CAPSULE | Freq: Every day | ORAL | Status: DC
  Administered 2020-07-09 – 2020-07-15 (×7): 220 mg via ORAL
  Filled 2020-07-09 (×7): qty 1

## 2020-07-09 MED ORDER — ASCORBIC ACID 500 MG PO TABS
500.0000 mg | ORAL_TABLET | Freq: Every day | ORAL | Status: DC
  Administered 2020-07-09 – 2020-07-15 (×8): 500 mg via ORAL
  Filled 2020-07-09 (×7): qty 1

## 2020-07-09 MED ORDER — ASPIRIN EC 81 MG PO TBEC
81.0000 mg | DELAYED_RELEASE_TABLET | Freq: Every day | ORAL | Status: DC
  Administered 2020-07-10 – 2020-07-11 (×2): 81 mg via ORAL
  Filled 2020-07-09 (×2): qty 1

## 2020-07-09 MED ORDER — IPRATROPIUM-ALBUTEROL 20-100 MCG/ACT IN AERS
2.0000 | INHALATION_SPRAY | Freq: Four times a day (QID) | RESPIRATORY_TRACT | Status: DC
  Administered 2020-07-09 – 2020-07-12 (×10): 2 via RESPIRATORY_TRACT
  Filled 2020-07-09 (×2): qty 4

## 2020-07-09 MED ORDER — SODIUM CHLORIDE 0.9 % IV SOLN
100.0000 mg | INTRAVENOUS | Status: AC
  Administered 2020-07-09 (×2): 100 mg via INTRAVENOUS
  Filled 2020-07-09 (×2): qty 20

## 2020-07-09 MED ORDER — SODIUM CHLORIDE 0.9 % IV SOLN
500.0000 mg | INTRAVENOUS | Status: DC
  Administered 2020-07-10: 500 mg via INTRAVENOUS
  Filled 2020-07-09: qty 500

## 2020-07-09 MED ORDER — HEPARIN BOLUS VIA INFUSION
3000.0000 [IU] | INTRAVENOUS | Status: AC
  Administered 2020-07-09: 3000 [IU] via INTRAVENOUS
  Filled 2020-07-09: qty 3000

## 2020-07-09 NOTE — ED Notes (Signed)
Pt's daughter updated on pt's status.

## 2020-07-09 NOTE — ED Notes (Signed)
Date and time results received: 07/09/20 1710 (use smartphrase ".now" to insert current time)  Test: Troponin Critical Value: 17,855  Name of Provider Notified: Dr. Melina Copa  Orders Received? Or Actions Taken?:

## 2020-07-09 NOTE — ED Notes (Addendum)
Unable to obtain labs after multiple attempts at an IV start. IV team consult placed with blood draw at IV start.

## 2020-07-09 NOTE — ED Triage Notes (Signed)
Pt presents from [place] assisted living facility with c/o generalized body aches and shortness of breath for 3 days. Pt's initial sat for EMS was 88% RA and 100% on 2L [place]. Pt was recently at a family gathering with another family member who was sick.

## 2020-07-09 NOTE — H&P (Addendum)
History and Physical    Lisa Cortez [id number] DOB: Sep 15, 1925 DOA: 07/09/2020  PCP: Lajean Manes, MD   Patient coming from: Home.  I have personally briefly reviewed patient's old medical records in [place]  Chief Complaint: Shortness of breath.  HPI: Lisa Cortez is a 86 y.o. female with medical history significant of CAD (stents x3), hyperlipidemia, hypertension, hypothyroidism, mild memory loss who is brought to the emergency department due to generalized body aches associated with progressively worse dyspnea for the past 3 days.  EMS stated that the patient was 88% on room air when they arrived, but she improved to 100% on 2L [place].  She recently assisted a family reunion, and which a relative was feeling sick.  She has had a dry cough.  She denies headache, sore throat, wheezing or hemoptysis.  No chest pain, palpitations, dizziness, diaphoresis, LE pitting edema, PND or orthopnea.  She has no appetite, but denies abdominal pain, nausea, emesis, diarrhea, constipation, melena or hematochezia.  No dysuria, frequency or hematuria.  No polyuria, polydipsia, polyphagia or blurred vision.  ED Course: Initial vital signs were temperature 98.5 F, pulse 85, respirations 25, blood pressure 135/60 mmHg and O2 sat 97% on nasal cannula oxygen.  The patient received dexamethasone 10 mg IVP, 500 mL bolus and aspirin in the emergency department.  SARS coronavirus 2 PCR was positive.  D-dimer was 2.61, LDH was 308, fibrinogen was 372, ferritin was 51, triglycerides 124, CRP 4.2 and procalcitonin 6.33.  Lactic acid was normal twice.  PT 12.9, INR 1.0 and PTT 29.  CBC showed a white count of 7.3, hemoglobin 10.3 g/dL and platelets 186.  Troponin was 17,855 ng/L.  BNP was 1188.5 pg/mL.  EKG was sinus rhythm with previous anteroseptal infarct, but no STEMI.  Review of Systems: As per HPI otherwise all other systems reviewed and are negative.  Past Medical History:  Diagnosis Date  . Chronic  kidney disease (CKD), stage IV (severe) ([place])   . Hyperlipidemia   . Hypertension   . Hypothyroidism   . 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 BREAST REDUCTION SURGERY    . CHOLECYSTECTOMY     Social History  reports that she has quit smoking. She has never used smokeless tobacco. She reports current alcohol use. She reports that she does not use drugs.  Allergies  Allergen Reactions  . Tenex [Guanfacine Hcl] Other (See Comments)    unknown  . Calcitonin Other (See Comments)    unknown  . Fosamax [Alendronate Sodium] Other (See Comments)    unknown  . Gatifloxacin Other (See Comments)    unknown  . Procrit [Epoetin (Alfa)] Other (See Comments)    unknown  . Verapamil Other (See Comments)    unknown   Family History  Problem Relation Age of Onset  . CVA Mother   . Bone cancer Brother   . Dementia Brother    Prior to Admission medications   Medication Sig Start Date End Date Taking? Authorizing Provider  albuterol (VENTOLIN HFA) 108 (90 Base) MCG/ACT inhaler Inhale 2 puffs into the lungs every 4 (four) hours as needed. 05/25/19  Yes [provider]  amLODipine (NORVASC) 5 MG tablet Take 5 mg by mouth daily.  03/23/19  Yes [provider]  aspirin EC 81 MG tablet Take 81 mg by mouth daily.   Yes [provider]  atorvastatin (LIPITOR) 10 MG tablet Take 10 mg by  mouth daily.  06/22/19  Yes [provider]  Cyanocobalamin (B-12) 2000 MCG TABS Take 1,000 mcg by mouth daily.   Yes [provider]  levothyroxine (SYNTHROID) 75 MCG tablet Take 75 mcg by mouth daily. 08/14/19  Yes [provider]  memantine (NAMENDA) 5 MG tablet Take 5 mg by mouth daily.  10/23/18  Yes [provider]  montelukast (SINGULAIR) 10 MG tablet Take 10 mg by mouth daily. 06/18/19  Yes [provider]  omeprazole (PRILOSEC) 20 MG capsule Take 20 mg by mouth daily. 06/19/19  Yes  [provider]  pramipexole (MIRAPEX) 0.25 MG tablet Take 0.25 mg by mouth daily.  06/19/19  Yes [provider]  sodium bicarbonate 650 MG tablet Take 650 mg by mouth 2 (two) times daily. 06/19/19  Yes [provider]  Fluticasone-Salmeterol (ADVAIR) 250-50 MCG/DOSE AEPB Inhale 1 puff into the lungs 2 (two) times daily. 07/31/18   [provider]  metoprolol tartrate (LOPRESSOR) 25 MG tablet Take 25 mg by mouth 2 (two) times daily.  Patient not taking: Reported on 07/09/2020 08/19/19   [provider]  mupirocin ointment (BACTROBAN) 2 % Apply 1 application topically 3 (three) times daily. Patient not taking: Reported on 07/09/2020 01/17/20   [provider]  OVER THE COUNTER MEDICATION Take 5 mLs by mouth daily.    [provider]  predniSONE (DELTASONE) 10 MG tablet Take 4 tabs daily for 1 day, 3 tabs daily for 2 days, 2 tabs daily for 2 days, 1 tab daily for 2 days and complete Patient not taking: Reported on 07/09/2020 08/24/19   Alma Friendly, MD  traMADol (ULTRAM) 50 MG tablet Take 50 mg by mouth daily as needed. Patient not taking: Reported on 07/09/2020 06/30/20   [provider]   Physical Exam: Vitals:   07/09/20 1315 07/09/20 1727 07/09/20 1843 07/09/20 1930  BP: 125/73 131/65  140/75  Pulse: 85 81  81  Resp: (!) 27 (!) 26  (!) 34  Temp:      TempSrc:      SpO2: 96% 98%  94%  Weight:   61.5 kg   Height:   4\' 11"  (1.499 m)    Constitutional: Looks acutely ill. Eyes: PERRL, lids and conjunctivae injected. ENMT: Mucous membranes are moist. Posterior pharynx clear of any exudate or lesions. Neck: normal, supple, no masses, no thyromegaly Respiratory:   Tachypneic in the mid 20s. Decreased breath sounds with bilateral rhonchi, wheezing and scattered crackles. No accessory muscle use.  Cardiovascular: Regular rate and rhythm, no murmurs / rubs / gallops. No extremity edema. 2+ pedal pulses. No carotid bruits.    Abdomen: Nondistended. Bowel sounds positive. Soft, no tenderness, no masses palpated. No hepatosplenomegaly.  Musculoskeletal: Generalized weakness.  No clubbing / cyanosis.  Good ROM, no contractures. Normal muscle tone.  Skin: Some areas of ecchymosis on extremities. Neurologic: CN 2-12 grossly intact. Sensation intact, DTR normal. Strength 5/5 in all 4.  Psychiatric: Normal judgment and insight. Alert and oriented x 2, partially oriented to time and situation.   Labs on Admission: I have personally reviewed following labs and imaging studies  CBC: Recent Labs  Lab 07/09/20 1726  WBC 7.3  NEUTROABS 4.6  HGB 10.3*  HCT 33.4*  MCV 97.7  PLT 774    Basic Metabolic Panel: Recent Labs  Lab 07/09/20 1550  NA 137  K 4.6  CL 107  CO2 17*  GLUCOSE 73  BUN 35*  CREATININE 1.79*  CALCIUM 8.3*  GFR: Estimated Creatinine Clearance: 16 mL/min (A) (by C-G formula based on SCr of 1.79 mg/dL (H)).  Liver Function Tests: Recent Labs  Lab 07/09/20 1550  AST 173*  ALT 67*  ALKPHOS 185*  BILITOT 0.5  PROT 7.4  ALBUMIN 3.0*    Urine analysis: No results found for: COLORURINE, APPEARANCEUR, LABSPEC, PHURINE, GLUCOSEU, HGBUR, BILIRUBINUR, KETONESUR, PROTEINUR, UROBILINOGEN, NITRITE, LEUKOCYTESUR  Radiological Exams on Admission: DG Chest Port 1 View  Result Date: 07/09/2020 CLINICAL DATA:  Cough, shortness of breath. Additional provided: Patient reports generalized body aches, shortness of breath for 3 days, sick contacts EXAM: PORTABLE CHEST 1 VIEW COMPARISON:  Prior chest radiographs 08/22/2019 and earlier FINDINGS: Heart size within normal limits. Aortic atherosclerosis. No appreciable airspace consolidation or pulmonary edema. No evidence of pleural effusion or pneumothorax. No acute bony abnormality identified. Left retrocardiac opacity suspicious for hiatal hernia. IMPRESSION: No evidence of acute cardiopulmonary abnormality. Aortic Atherosclerosis (ICD10-I70.0). Suspected  hiatal hernia. Electronically Signed   By: Kellie Simmering DO   On: 07/09/2020 12:03   08/22/2019 echocardiogram  IMPRESSIONS   1. The left ventricle has normal systolic function with an ejection  fraction of 60-65%. The cavity size was normal. Left ventricular diastolic  Doppler parameters are consistent with impaired relaxation. Elevated left  ventricular end-diastolic pressure.  2. The right ventricle has normal systolic function. The cavity was  normal. There is no increase in right ventricular wall thickness. Right  ventricular systolic pressure could not be assessed.  3. Left atrial size was mildly dilated.  4. Right atrial size was mildly dilated.  5. Moderate calcification of the anterior mitral valve leaflet. There is  moderate mitral annular calcification present.  6. The aortic valve was not well visualized. Aortic valve regurgitation  is mild by color flow Doppler. Mild stenosis of the aortic valve.  7. The aorta is normal unless otherwise noted.  8. The inferior vena cava was dilated in size with <50% respiratory  variability.   EKG: Independently reviewed.  Vent. rate 84 BPM PR interval * ms QRS duration 90 ms QT/QTc 390/461 ms P-R-T axes 64 60 157 Sinus rhythm Prolonged PR interval Probable left atrial enlargement Anteroseptal infarct, age indeterminate  Assessment/Plan Principal Problem:   NSTEMI (non-ST elevated myocardial infarction) ([place]) Admit to progressive unit/inpatient. Continue supplemental oxygen. Continue aspirin. Continue heparin infusion. Low-dose beta-blocker as BP allows. Trend troponin level. Check echocardiogram. Cardiology to see in the a.m. Treatment likely will be conservative. May stay at [facility] if no beds available at [facility].  Active Problems:   Acute respiratory disease due to COVID-19 virus Continue supplemental oxygen. Continue bronchodilators. Antitussives as needed. Remdesivir per pharmacy. Methylprednisolone 0.5 mg/kg  twice daily. CAP antibiotic coverage given elevated procalcitonin.    COPD with acute exacerbation (HCC) On bronchodilators and glucocorticoids.    Hypertension Hold amlodipine due to soft BP. Monitor blood pressure.    Chronic kidney disease (CKD), stage IV (severe) (HCC) Monitor renal function electrolytes.    Hyperlipidemia Continue atorvastatin 10 mg p.o. daily.    Hypothyroidism Continue levothyroxine 75 mcg p.o. daily.    Normocytic anemia Monitor H&H. Transfuse use as needed.    Diastolic dysfunction No signs of fluid overload. Check echocardiogram.    Restless leg syndrome On Mirapex.     DVT prophylaxis: On heparin. Code Status:   DNR.  The patient made emphasis that she was DNR. Family Communication: Disposition Plan:   Patient is from:  [facility] assisted [facility].  Anticipated DC to:  [facility] assisted [facility].  Anticipated DC date:  07/13/2020.  Anticipated DC barriers: Clinical status.  Consults called: Admission status:  Progressive/inpatient.  Severity of Illness:  Very high.  Reubin Milan MD [facility]  How to contact the [facility] Attending or Consulting provider [place] or covering provider during after hours [place], for this patient?   1. Check the care team in [facility] and look for a) attending/consulting TRH provider listed and b) the [facility] team listed 2. Log into [email or web address] and use 's universal password to access. If you do not have the password, please contact the hospital operator. 3. Locate the [facility] provider you are looking for under [facility] and page to a number that you can be directly reached. 4. If you still have difficulty reaching the provider, please page the [facility] (Director on Call) for the Hospitalists listed on amion for assistance.  07/09/2020, 7:47 PM   This document was prepared using Dragon voice recognition software and may contain some unintended transcription errors.

## 2020-07-09 NOTE — Progress Notes (Signed)
ANTICOAGULATION CONSULT NOTE - Initial Consult  Pharmacy Consult for IV heparin Indication: ACS  Allergies  Allergen Reactions  . Tenex [Guanfacine Hcl] Other (See Comments)    unknown  . Calcitonin Other (See Comments)    unknown  . Fosamax [Alendronate Sodium] Other (See Comments)    unknown  . Gatifloxacin Other (See Comments)    unknown  . Procrit [Epoetin (Alfa)] Other (See Comments)    unknown  . Verapamil Other (See Comments)    unknown    Patient Measurements: Height: 4\' 11"  (149.9 cm) Weight: 61.5 kg (135 lb 9.3 oz) IBW/kg (Calculated) : 43.2 Heparin Dosing Weight: 56.3 kg  Vital Signs: Temp: 98.5 F (36.9 C) (07/21 1136) Temp Source: Oral (07/21 1136) BP: 131/65 (07/21 1727) Pulse Rate: 81 (07/21 1727)  Labs: Recent Labs    07/09/20 1550 07/09/20 1726  HGB  --  10.3*  HCT  --  33.4*  PLT  --  186  APTT 29  --   LABPROT 12.9  --   INR 1.0  --   CREATININE 1.79*  --   TROPONINIHS 02,585*  --     Estimated Creatinine Clearance: 16 mL/min (A) (by C-G formula based on SCr of 1.79 mg/dL (H)).   Medical History: Past Medical History:  Diagnosis Date  . Chronic kidney disease (CKD), stage IV (severe) ([place])   . Hyperlipidemia   . Hypertension   . Hypothyroidism   . 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 COVID +. High sensitivity troponin elevated at 17855 ng/L. Pharmacy consulted for IV heparin dosing for ACS. D-dimer 2.61. Hgb 10.3 (appears at baseline), Pltc WNL. APTT 29 seconds, PT/INR 12.9/1. Patient not on anticoagulants PTA.  Goal of Therapy:  Heparin level 0.3-0.7 units/ml Monitor platelets by anticoagulation protocol: Yes   Plan:  Heparin 3000 units IV bolus x 1, then start heparin infusion at 700 units/hr Heparin level 8 hours after initiation Daily CBC, heparin level Monitor closely for s/sx of bleeding   Lindell Spar, PharmD,  BCPS Clinical Pharmacist  07/09/2020,6:48 PM

## 2020-07-09 NOTE — ED Provider Notes (Signed)
Signout from Dr. Stark Jock.  86 year old female who is fully vaccinated here with cough body ache shortness of breath for 3 days.  Sats were 88% on room air.  Improved on 2 L.  She is Covid positive.  Plan is to follow-up on labs and admit to hospitalist service. Physical Exam  BP 125/73   Pulse 85   Temp 98.5 F (36.9 C) (Oral)   Resp (!) 27   SpO2 96%   Physical Exam  ED Course/Procedures   Clinical Course as of Jul 09 1924  Wed Jul 09, 2020  1859 D/w Dr Meda Coffee cardiology - trend troponin. Echo. [facility] not need [facility]. Do not recommend cath at this time.    [MB]  1922 Discussed with Dr. Olevia Bowens [facility] who will evaluate the patient for admission.   [MB]    Clinical Course User Index [MB] Hayden Rasmussen, MD    Procedures  MDM  Patient's troponin came back quite elevated at 17,000.  I went to evaluate the patient vascular what is going on.  She said she has had a couple of days of shortness of breath and headache.  Cough.  Denies any chest pain.  She has a DNR form at bedside.  Have placed a consult into cardiology.  CRITICAL CARE Performed by: Hayden Rasmussen   Total critical care time: 35 minutes  Critical care time was exclusive of separately billable procedures and treating other patients.  Critical care was necessary to treat or prevent imminent or life-threatening deterioration.  Critical care was time spent personally by me on the following activities: development of treatment plan with patient and/or surrogate as well as nursing, discussions with consultants, evaluation of patient's response to treatment, examination of patient, obtaining history from patient or surrogate, ordering and performing treatments and interventions, ordering and review of laboratory studies, ordering and review of radiographic studies, pulse oximetry and re-evaluation of patient's condition.  Patient with NSTEMI requiring discussions with cardiology and initiation of IV  heparin.       Hayden Rasmussen, MD 07/10/20 1159

## 2020-07-09 NOTE — ED Provider Notes (Signed)
[facility] DEPT Provider Note   CSN: [id number] Arrival date & time: 07/09/20  1112     History Chief Complaint  Patient presents with  . Generalized Body Aches  . Shortness of Breath    Lisa Cortez is a 86 y.o. female.  Patient is a 86 year old female with history of chronic renal insufficiency, hypertension, hypothyroidism, hyperlipidemia, COPD, pneumonia.  She presents today for evaluation of cough and shortness of breath.  This is worsened over the past 3 days.  Per EMS, oxygen saturations were 88% upon their arrival and improved with 2 L nasal cannula.  I am told the patient attended a family function over the weekend and other family members were ill.  Patient tells me she has had both of her Covid vaccination shots.  The history is provided by the patient.  Shortness of Breath Severity:  Moderate Onset quality:  Sudden Duration:  3 days Timing:  Constant Progression:  Worsening Chronicity:  New Context: URI   Relieved by:  Nothing Worsened by:  Nothing      Past Medical History:  Diagnosis Date  . Chronic kidney disease (CKD), stage IV (severe) ([place])   . Hyperlipidemia   . Hypertension   . Hypothyroidism   . 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 Sepsis ([place]) 08/20/2019  . Pneumonia 08/20/2019  . COPD with acute exacerbation ([place]) 08/20/2019  . Acute on chronic respiratory failure with hypoxia ([place]) 08/20/2019  . Chest pain 08/20/2019  . Nausea and vomiting 08/20/2019  . Macrocytic anemia 08/20/2019  . Hypertension 08/20/2019  . 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 BREAST REDUCTION SURGERY    . CHOLECYSTECTOMY       OB History   No obstetric history on file.     Family History  Problem Relation Age of Onset  . CVA Mother   . Bone cancer Brother   . Dementia Brother      Social History   Tobacco Use  . Smoking status: Former Research scientist (life sciences)  . Smokeless tobacco: Never Used  Vaping Use  . Vaping Use: Never used  Substance Use Topics  . Alcohol use: Yes    Comment: occassional glass of wine  . Drug use: Never    Home Medications Prior to Admission medications   Medication Sig Start Date End Date Taking? Authorizing Provider  albuterol (VENTOLIN HFA) 108 (90 Base) MCG/ACT inhaler Inhale 2 puffs into the lungs every 4 (four) hours as needed. 05/25/19   [provider]  amLODipine (NORVASC) 5 MG tablet Take 5 mg by mouth at bedtime.  03/23/19   [provider]  aspirin EC 81 MG tablet Take 81 mg by mouth daily.    [provider]  atorvastatin (LIPITOR) 10 MG tablet Take 10 mg by mouth at bedtime.  06/22/19   [provider]  Cyanocobalamin (B-12) 2000 MCG TABS Take 1,000 mcg by mouth daily.    [provider]  Fluticasone-Salmeterol (ADVAIR) 250-50 MCG/DOSE AEPB Inhale 1 puff into the lungs 2 (two) times daily. 07/31/18   [provider]  levothyroxine (SYNTHROID) 75 MCG tablet Take 75 mcg by mouth daily. 08/14/19   [provider]  memantine (NAMENDA) 5 MG tablet Take 5 mg by mouth at bedtime.  10/23/18   [provider]  metoprolol tartrate (LOPRESSOR) 25 MG tablet  Take 25 mg by mouth 2 (two) times daily.  08/19/19   [provider]  montelukast (SINGULAIR) 10 MG tablet Take 10 mg by mouth daily. 06/18/19   [provider]  omeprazole (PRILOSEC) 20 MG capsule Take 20 mg by mouth daily. 06/19/19   [provider]  OVER THE COUNTER MEDICATION Take 5 mLs by mouth daily.    [provider]  pramipexole (MIRAPEX) 0.25 MG tablet Take 0.25 mg by mouth at bedtime.  06/19/19   [provider]  predniSONE (DELTASONE) 10 MG tablet Take 4 tabs daily for 1 day, 3 tabs daily for 2 days, 2 tabs daily for 2 days, 1 tab daily for 2 days and complete 08/24/19   Alma Friendly, MD  sodium bicarbonate 650 MG tablet Take 650 mg by mouth 2 (two) times daily. 06/19/19   [provider]    Allergies    Tenex [guanfacine hcl], Calcitonin, Fosamax [alendronate sodium], Gatifloxacin, Procrit [epoetin (alfa)], and Verapamil  Review of Systems   Review of Systems  Respiratory: Positive for shortness of breath.   All other systems reviewed and are negative.   Physical Exam Updated Vital Signs There were no vitals taken for this visit.  Physical Exam Vitals and nursing note reviewed.  Constitutional:      General: She is not in acute distress.    Appearance: She is well-developed. She is not diaphoretic.  HENT:     Head: Normocephalic and atraumatic.  Cardiovascular:     Rate and Rhythm: Normal rate and regular rhythm.     Heart sounds: No murmur heard.  No friction rub. No gallop.   Pulmonary:     Effort: Pulmonary effort is normal. No respiratory distress.     Breath sounds: Examination of the right-lower field reveals rales. Examination of the left-lower field reveals rales. Rales present. No wheezing.  Abdominal:     General: Bowel sounds are normal. There is no distension.     Palpations: Abdomen is soft.     Tenderness: There is no abdominal tenderness.  Musculoskeletal:        General: Normal range of motion.     Cervical back: Normal range of motion and neck supple.     Right lower leg: No tenderness. No edema.     Left lower leg: No tenderness. No edema.  Skin:    General: Skin is warm and dry.  Neurological:     Mental Status: She is alert and oriented to person, place, and time.     ED Results / Procedures / Treatments   Labs (all labs ordered are listed, but only abnormal results are displayed) Labs Reviewed  SARS CORONAVIRUS 2 BY RT PCR ([facility])  COMPREHENSIVE METABOLIC PANEL  CBC WITH DIFFERENTIAL/PLATELET  BRAIN NATRIURETIC PEPTIDE  TROPONIN I (HIGH SENSITIVITY)     EKG None  Radiology No results found.  Procedures Procedures (including critical care time)  Medications Ordered in ED Medications - No data to display  ED Course  I have reviewed the triage vital signs and the nursing notes.  Pertinent labs & imaging results that were available during my care of the patient were reviewed by me and considered in my medical decision making (see chart for details).  Clinical Course as of Jul 11 716  Wed Jul 09, 2020  1859 D/w Dr Meda Coffee cardiology - trend troponin. Echo. Does not need [facility]. Do not recommend cath at this time.    [  MB]  1922 Discussed with Dr. Olevia Bowens [facility] hospitalist who will evaluate the patient for admission.   [MB]    Clinical Course User Index [MB] Hayden Rasmussen, MD   MDM Rules/Calculators/A&P  With extensive past medical history presenting with cough and shortness of breath.  Patient is hypoxic on room air, but saturations have improved with nasal cannula.  Her work-up reveals a positive COVID-19 test.  Nursing staff had significant difficulty obtaining laboratory studies and IV access.  Laboratory studies are currently pending.  Care to be signed out to Dr. Melina Copa at shift change.  Patient will require admission, however needs laboratory studies resulted first.  Dr. Melina Copa will obtain the results of the laboratory studies and determine the final disposition.  CRITICAL CARE Performed by: Veryl Speak Total critical care time: 35 minutes Critical care time was exclusive of separately billable procedures and treating other patients. Critical care was necessary to treat or prevent imminent or life-threatening deterioration. Critical care was time spent personally by me on the following activities: development of treatment plan with patient and/or surrogate as well as nursing, discussions with consultants, evaluation of patient's response to treatment, examination of patient, obtaining history from patient or  surrogate, ordering and performing treatments and interventions, ordering and review of laboratory studies, ordering and review of radiographic studies, pulse oximetry and re-evaluation of patient's condition.  Lisa Cortez was evaluated in Emergency Department on 07/11/2020 for the symptoms described in the history of present illness. She was evaluated in the context of the global COVID-19 pandemic, which necessitated consideration that the patient might be at risk for infection with the SARS-CoV-2 virus that causes COVID-19. Institutional protocols and algorithms that pertain to the evaluation of patients at risk for COVID-19 are in a state of rapid change based on information released by regulatory bodies including the CDC and federal and state organizations. These policies and algorithms were followed during the patient's care in the ED.   Final Clinical Impression(s) / ED Diagnoses Final diagnoses:  None    Rx / DC Orders ED Discharge Orders    None       Veryl Speak, MD 07/11/20 [phone]

## 2020-07-09 NOTE — ED Notes (Signed)
Multiple IV attempts made by charge RN, Dillon Bjork. IV team consult will be placed.

## 2020-07-09 NOTE — ED Notes (Signed)
Call received from pt daughter Darden Palmer [phone] requesting contact w/pt updates when possible. RN advised. [employer]

## 2020-07-09 NOTE — ED Notes (Signed)
IV team able to get an IV on pt but unable to get labs. Lorre Nick, RN made aware that labs have not bee obtained and that main lab phlebotomy will need to be consulted for lab draws.

## 2020-07-09 NOTE — ED Notes (Signed)
IV team reported that they will be here in 30 minutes. There will be a delay in getting labs until that time. MD made aware.

## 2020-07-09 NOTE — ED Notes (Signed)
Daughter updated on pt's status.

## 2020-07-09 NOTE — ED Notes (Signed)
IV attempt x 2 unsuccessful by this RN. Charge RN, Dillon Bjork made aware and he is going to look.

## 2020-07-09 NOTE — ED Notes (Signed)
Blood drawn from the right hand using aseptic technique, gauze and paper tape applied. No bleeding noted from the site a this time.

## 2020-07-10 ENCOUNTER — Inpatient Hospital Stay (HOSPITAL_COMMUNITY): Payer: Medicare Other

## 2020-07-10 ENCOUNTER — Other Ambulatory Visit: Payer: Self-pay

## 2020-07-10 ENCOUNTER — Encounter (HOSPITAL_COMMUNITY): Payer: Self-pay | Admitting: Internal Medicine

## 2020-07-10 DIAGNOSIS — I10 Essential (primary) hypertension: Secondary | ICD-10-CM

## 2020-07-10 DIAGNOSIS — J441 Chronic obstructive pulmonary disease with (acute) exacerbation: Secondary | ICD-10-CM

## 2020-07-10 DIAGNOSIS — E039 Hypothyroidism, unspecified: Secondary | ICD-10-CM

## 2020-07-10 DIAGNOSIS — N184 Chronic kidney disease, stage 4 (severe): Secondary | ICD-10-CM

## 2020-07-10 DIAGNOSIS — U071 COVID-19: Principal | ICD-10-CM

## 2020-07-10 DIAGNOSIS — E785 Hyperlipidemia, unspecified: Secondary | ICD-10-CM

## 2020-07-10 DIAGNOSIS — I214 Non-ST elevation (NSTEMI) myocardial infarction: Secondary | ICD-10-CM

## 2020-07-10 DIAGNOSIS — J069 Acute upper respiratory infection, unspecified: Secondary | ICD-10-CM

## 2020-07-10 DIAGNOSIS — I5189 Other ill-defined heart diseases: Secondary | ICD-10-CM

## 2020-07-10 DIAGNOSIS — D649 Anemia, unspecified: Secondary | ICD-10-CM

## 2020-07-10 DIAGNOSIS — R7401 Elevation of levels of liver transaminase levels: Secondary | ICD-10-CM

## 2020-07-10 LAB — ECHOCARDIOGRAM COMPLETE
AR max vel: 1.3 cm2
AV Area VTI: 0.89 cm2
AV Area mean vel: 1.07 cm2
AV Mean grad: 5 mmHg
AV Peak grad: 10 mmHg
Ao pk vel: 1.58 m/s
Area-P 1/2: 3.6 cm2
Height: 59 in
S' Lateral: 2.2 cm
Weight: 2169.33 oz

## 2020-07-10 LAB — COMPREHENSIVE METABOLIC PANEL
ALT: 53 U/L — ABNORMAL HIGH (ref 0–44)
AST: 132 U/L — ABNORMAL HIGH (ref 15–41)
Albumin: 2.6 g/dL — ABNORMAL LOW (ref 3.5–5.0)
Alkaline Phosphatase: 149 U/L — ABNORMAL HIGH (ref 38–126)
Anion gap: 12 (ref 5–15)
BUN: 42 mg/dL — ABNORMAL HIGH (ref 8–23)
CO2: 14 mmol/L — ABNORMAL LOW (ref 22–32)
Calcium: 7.8 mg/dL — ABNORMAL LOW (ref 8.9–10.3)
Chloride: 111 mmol/L (ref 98–111)
Creatinine, Ser: 1.5 mg/dL — ABNORMAL HIGH (ref 0.44–1.00)
GFR calc Af Amer: 35 mL/min — ABNORMAL LOW (ref >60)
GFR calc non Af Amer: 30 mL/min — ABNORMAL LOW (ref >60)
Glucose, Bld: 120 mg/dL — ABNORMAL HIGH (ref 70–99)
Potassium: 4.1 mmol/L (ref 3.5–5.1)
Sodium: 137 mmol/L (ref 135–145)
Total Bilirubin: 0.5 mg/dL (ref 0.3–1.2)
Total Protein: 6.5 g/dL (ref 6.5–8.1)

## 2020-07-10 LAB — BLOOD CULTURE ID PANEL (REFLEXED)

## 2020-07-10 LAB — CBC
HCT: 30.6 % — ABNORMAL LOW (ref 36.0–46.0)
Hemoglobin: 9.7 g/dL — ABNORMAL LOW (ref 12.0–15.0)
MCH: 30.6 pg (ref 26.0–34.0)
MCHC: 31.7 g/dL (ref 30.0–36.0)
MCV: 96.5 fL (ref 80.0–100.0)
Platelets: 191 10*3/uL (ref 150–400)
RBC: 3.17 MIL/uL — ABNORMAL LOW (ref 3.87–5.11)
RDW: 14.1 % (ref 11.5–15.5)
WBC: 5.4 10*3/uL (ref 4.0–10.5)
nRBC: 0 % (ref 0.0–0.2)

## 2020-07-10 LAB — FERRITIN: Ferritin: 59 ng/mL (ref 11–307)

## 2020-07-10 LAB — MRSA PCR SCREENING: MRSA by PCR: NEGATIVE

## 2020-07-10 LAB — HEMOGLOBIN AND HEMATOCRIT, BLOOD
HCT: 30.4 % — ABNORMAL LOW (ref 36.0–46.0)
Hemoglobin: 9.1 g/dL — ABNORMAL LOW (ref 12.0–15.0)

## 2020-07-10 LAB — C-REACTIVE PROTEIN: CRP: 4.2 mg/dL — ABNORMAL HIGH (ref <1.0)

## 2020-07-10 LAB — STREP PNEUMONIAE URINARY ANTIGEN: Strep Pneumo Urinary Antigen: POSITIVE — AB

## 2020-07-10 LAB — MAGNESIUM: Magnesium: 2.1 mg/dL (ref 1.7–2.4)

## 2020-07-10 LAB — D-DIMER, QUANTITATIVE: D-Dimer, Quant: 1.67 ug/mL-FEU — ABNORMAL HIGH (ref 0.00–0.50)

## 2020-07-10 LAB — HEPARIN LEVEL (UNFRACTIONATED)
Heparin Unfractionated: 0.53 IU/mL (ref 0.30–0.70)
Heparin Unfractionated: 0.9 IU/mL — ABNORMAL HIGH (ref 0.30–0.70)

## 2020-07-10 LAB — TROPONIN I (HIGH SENSITIVITY)
Troponin I (High Sensitivity): 8122 ng/L (ref <18)
Troponin I (High Sensitivity): 7976 ng/L (ref <18)

## 2020-07-10 LAB — ABO/RH: ABO/RH(D): A POS

## 2020-07-10 LAB — PHOSPHORUS: Phosphorus: 4.5 mg/dL (ref 2.5–4.6)

## 2020-07-10 MED ORDER — ATORVASTATIN CALCIUM 10 MG PO TABS
10.0000 mg | ORAL_TABLET | Freq: Every day | ORAL | Status: DC
  Administered 2020-07-10 – 2020-07-15 (×6): 10 mg via ORAL
  Filled 2020-07-10 (×6): qty 1

## 2020-07-10 MED ORDER — LEVOTHYROXINE SODIUM 75 MCG PO TABS
75.0000 ug | ORAL_TABLET | Freq: Every day | ORAL | Status: DC
  Administered 2020-07-10 – 2020-07-15 (×6): 75 ug via ORAL
  Filled 2020-07-10 (×6): qty 1

## 2020-07-10 MED ORDER — LIP MEDEX EX OINT
TOPICAL_OINTMENT | CUTANEOUS | Status: DC | PRN
  Filled 2020-07-10: qty 7

## 2020-07-10 MED ORDER — STERILE WATER FOR INJECTION IV SOLN
Freq: Once | INTRAVENOUS | Status: AC
  Filled 2020-07-10: qty 150

## 2020-07-10 MED ORDER — PRAMIPEXOLE DIHYDROCHLORIDE 0.25 MG PO TABS
0.2500 mg | ORAL_TABLET | Freq: Every day | ORAL | Status: DC
  Administered 2020-07-10 – 2020-07-15 (×6): 0.25 mg via ORAL
  Filled 2020-07-10 (×6): qty 1

## 2020-07-10 MED ORDER — METHYLPREDNISOLONE SODIUM SUCC 40 MG IJ SOLR
40.0000 mg | Freq: Every day | INTRAMUSCULAR | Status: DC
  Administered 2020-07-11 – 2020-07-12 (×2): 40 mg via INTRAVENOUS
  Filled 2020-07-10 (×2): qty 1

## 2020-07-10 MED ORDER — METHYLPREDNISOLONE SODIUM SUCC 40 MG IJ SOLR: 40.0000 mg | Freq: Two times a day (BID) | INTRAMUSCULAR | Status: DC

## 2020-07-10 MED ORDER — MEMANTINE HCL 10 MG PO TABS
5.0000 mg | ORAL_TABLET | Freq: Every day | ORAL | Status: DC
  Administered 2020-07-10 – 2020-07-15 (×6): 5 mg via ORAL
  Filled 2020-07-10 (×8): qty 1

## 2020-07-10 MED ORDER — AMLODIPINE BESYLATE 5 MG PO TABS: 5.0000 mg | ORAL_TABLET | Freq: Every day | ORAL | Status: DC

## 2020-07-10 MED ORDER — MOMETASONE FURO-FORMOTEROL FUM 200-5 MCG/ACT IN AERO
2.0000 | INHALATION_SPRAY | Freq: Two times a day (BID) | RESPIRATORY_TRACT | Status: DC
  Administered 2020-07-12 – 2020-07-15 (×7): 2 via RESPIRATORY_TRACT
  Filled 2020-07-10 (×2): qty 8.8

## 2020-07-10 MED ORDER — PANTOPRAZOLE SODIUM 40 MG PO TBEC
40.0000 mg | DELAYED_RELEASE_TABLET | Freq: Every day | ORAL | Status: DC
  Administered 2020-07-10 – 2020-07-11 (×2): 40 mg via ORAL
  Filled 2020-07-10 (×2): qty 1

## 2020-07-10 MED ORDER — VITAMIN B-12 1000 MCG PO TABS
1000.0000 ug | ORAL_TABLET | Freq: Every day | ORAL | Status: DC
  Administered 2020-07-10 – 2020-07-15 (×6): 1000 ug via ORAL
  Filled 2020-07-10 (×6): qty 1

## 2020-07-10 MED ORDER — MONTELUKAST SODIUM 10 MG PO TABS
10.0000 mg | ORAL_TABLET | Freq: Every day | ORAL | Status: DC
  Administered 2020-07-10 – 2020-07-14 (×5): 10 mg via ORAL
  Filled 2020-07-10 (×7): qty 1

## 2020-07-10 MED ORDER — ALBUTEROL SULFATE HFA 108 (90 BASE) MCG/ACT IN AERS
2.0000 | INHALATION_SPRAY | RESPIRATORY_TRACT | Status: DC | PRN
  Administered 2020-07-10 – 2020-07-11 (×2): 2 via RESPIRATORY_TRACT
  Filled 2020-07-10: qty 6.7

## 2020-07-10 MED ORDER — HEPARIN (PORCINE) 25000 UT/250ML-% IV SOLN
600.0000 [IU]/h | INTRAVENOUS | Status: DC
  Administered 2020-07-10: 600 [IU]/h via INTRAVENOUS
  Filled 2020-07-10: qty 250

## 2020-07-10 NOTE — Progress Notes (Addendum)
[facility] for IV heparin Indication: ACS/NSTEMI   Allergies  Allergen Reactions  . Tenex [Guanfacine Hcl] Other (See Comments)    unknown  . Calcitonin Other (See Comments)    unknown  . Fosamax [Alendronate Sodium] Other (See Comments)    unknown  . Gatifloxacin Other (See Comments)    unknown  . Procrit [Epoetin (Alfa)] Other (See Comments)    unknown  . Verapamil Other (See Comments)    unknown    Patient Measurements: Height: 4\' 11"  (149.9 cm) Weight: 61.5 kg (135 lb 9.3 oz) IBW/kg (Calculated) : 43.2 Heparin Dosing Weight: 56.3 kg  Vital Signs: BP: 121/58 (07/22 1530) Pulse Rate: 73 (07/22 1530)  Labs: Recent Labs    07/09/20 1550 07/09/20 1726 07/10/20 0559 07/10/20 1451  HGB  --  10.3* 9.7*  --   HCT  --  33.4* 30.6*  --   PLT  --  186 191  --   APTT 29  --   --   --   LABPROT 12.9  --   --   --   INR 1.0  --   --   --   HEPARINUNFRC  --   --  0.53 0.90*  CREATININE 1.79*  --  1.50*  --   TROPONINIHS 56,213*  --  8,122* 7,976*    Estimated Creatinine Clearance: 19.1 mL/min (A) (by C-G formula based on SCr of 1.5 mg/dL (H)).   Medical History: Past Medical History:  Diagnosis Date  . Anemia of renal disease   . Aortic insufficiency   . Aortic stenosis   . Chronic kidney disease (CKD), stage IV (severe) ([place])   . COPD (chronic obstructive pulmonary disease) ([place])   . Diastolic dysfunction   . Hyperlipidemia   . Hypertension   . Hypothyroidism   . 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 COVID +. High sensitivity troponin elevated at 17855 ng/L. Pharmacy consulted for IV heparin dosing for ACS. D-dimer 2.61. Hgb 10.3 (appears at baseline), Pltc WNL. APTT 29 seconds, PT/INR 12.9/1. Patient not on anticoagulants PTA.  07/10/2020: -1451 heparin level = 0.9 units/mL, now supratherapeutic -CBC: Hgb low at 9.7,  Pltc WNL -RN reports patient had large, dark BM, but did not appear melanotic-MD checking FOBT, H/H  -No infusion related concerns per nursing -Confirmed with RN that heparin level drawn from opposite arm from where IV heparin infusing   Goal of Therapy:  Heparin level 0.3-0.7 units/ml Monitor platelets by anticoagulation protocol: Yes   Plan:  Decrease heparin infusion to 600 units/hr Heparin level 8 hours after rate change Daily CBC, heparin level while on heparin Monitor closely for s/sx of bleeding F/u FOBT and H/H    Lindell Spar, PharmD, BCPS Clinical Pharmacist  07/10/2020,4:19 PM

## 2020-07-10 NOTE — Progress Notes (Signed)
PHARMACY - PHYSICIAN COMMUNICATION CRITICAL VALUE ALERT - BLOOD CULTURE IDENTIFICATION (BCID)  Lisa Cortez is an 86 y.o. female who presented to [facility] on 07/09/2020 with a chief complaint of progressive dyspnea, cough, and weakness.   Assessment: Acute hypoxemic respiratory failure due to COVID-19 pneumonia and superimposed RLL pneumococcal pneumonia on COPD, NSTEMI  Name of physician (or Provider) Contacted: Dr. Bonner Puna  Current antibiotics: Ceftriaxone   Changes to prescribed antibiotics recommended:  Considering MR-CoNS in blood a contaminant for now. Continue current antibiotics for PNA per MD.   Results for orders placed or performed during the hospital encounter of 07/09/20  Blood Culture ID Panel (Reflexed) (Collected: 07/09/2020  3:50 PM)  Result Value Ref Range   Enterococcus species NOT DETECTED NOT DETECTED   Listeria monocytogenes NOT DETECTED NOT DETECTED   Staphylococcus species DETECTED (A) NOT DETECTED   Staphylococcus aureus (BCID) NOT DETECTED NOT DETECTED   Methicillin resistance DETECTED (A) NOT DETECTED   Streptococcus species NOT DETECTED NOT DETECTED   Streptococcus agalactiae NOT DETECTED NOT DETECTED   Streptococcus pneumoniae NOT DETECTED NOT DETECTED   Streptococcus pyogenes NOT DETECTED NOT DETECTED   Acinetobacter baumannii NOT DETECTED NOT DETECTED   Enterobacteriaceae species NOT DETECTED NOT DETECTED   Enterobacter cloacae complex NOT DETECTED NOT DETECTED   Escherichia coli NOT DETECTED NOT DETECTED   Klebsiella oxytoca NOT DETECTED NOT DETECTED   Klebsiella pneumoniae NOT DETECTED NOT DETECTED   Proteus species NOT DETECTED NOT DETECTED   Serratia marcescens NOT DETECTED NOT DETECTED   Haemophilus influenzae NOT DETECTED NOT DETECTED   Neisseria meningitidis NOT DETECTED NOT DETECTED   Pseudomonas aeruginosa NOT DETECTED NOT DETECTED   Candida albicans NOT DETECTED NOT DETECTED   Candida glabrata NOT DETECTED NOT DETECTED   Candida krusei  NOT DETECTED NOT DETECTED   Candida parapsilosis NOT DETECTED NOT DETECTED   Candida tropicalis NOT DETECTED NOT DETECTED    Lisa Cortez 07/10/2020  6:52 PM

## 2020-07-10 NOTE — ED Notes (Signed)
Pt had large dark BM. Pt also has some skin breakdown on buttocks, rash appearing. Barrier cream placed, linens changed, brief changed. Pt states she is now comfortable and no complaints.

## 2020-07-10 NOTE — Consult Note (Signed)
Patient arrived by patient transport from [facility]. Alert and oriented x 4. Daughter Hilda Blades was notified of patient being admitted here and is POA. Will bring paperwork tomorrow for POA information and living will.

## 2020-07-10 NOTE — Progress Notes (Signed)
PROGRESS NOTE  Lisa Cortez  VOH:[id number] DOB: [birth year] DOA: 07/09/2020 PCP: Lajean Manes, MD   Brief Narrative: Lisa Cortez is a 86 y.o. female with a history of CAD s/p PCI x3, HTN, HLD, stage IV CKD, hypothyroidism, mild memory loss and covid-19 vaccination in Jan-Feb 2021 who presented to [facility] 7/21 from ALF with progressive dyspnea, cough, and weakness found to be hypoxemic requiring 2L O2. SARS-CoV-2 PCR was positive. No CXR infiltrate on initial exam. CRP 4.2, PCT 6.33, so antibiotics (CTX, azithromycin) added to remdesivir and steroids. Streptococcal urine antigen was positive and repeat CXR demonstrates developing RLL infiltrate. Troponin noted to be >17k and ECG with ST elevations felt to be consistent with NSTEMI. Heparin and aspirin were started and cardiology consulted. Creatinine 1.79 and AST 173, ALT 67. Lactic acid normal.   Assessment & Plan: Principal Problem:   NSTEMI (non-ST elevated myocardial infarction) (HCC) Active Problems:   COPD with acute exacerbation (HCC)   Hypertension   Chronic kidney disease (CKD), stage IV (severe) (HCC)   Hyperlipidemia   Hypothyroidism   Normocytic anemia   Transaminitis   Diastolic dysfunction   Acute respiratory disease due to COVID-19 virus  Acute hypoxemic respiratory failure due to covid-19 pneumonia and superimposed RLL pneumonia on COPD: SARS-CoV-2 PCR positive 7/21. Confirmed cycle thresholds of 12.6 (E) and 14.6 (N2) consistent with high viral load/true positive despite full vaccination completed Feb 2021.  - Check covid-19 IgG - Wean oxygen as tolerated, still on 2L.  - Continue remdesivir x5 days (7/21 - 7/25). LFTs fortunately trending downward.  - Steroids started for hypoxemia. With high viral load and active bacterial infection, will keep on lower end of dosing. - Vitamin C, zinc - Encourage OOB, IS, FV, and awake proning if able - Tylenol and antitussives prn - Continue airborne, contact precautions for 21 days  from positive testing. - IV heparin for NSTEMI. Will monitor d-dimer. With mild hypoxia and d-dimer elevation commensurate with inflammatory marker elevation, not currently planning further investigation. CrCl will not support CTA, V/Q scan is technically an aerosolizing procedure, so would be limited to LE venous U/S and/or empiric anticoagulation. - Continue prn bronchodilators. No wheezing on exam.  RLL pneumococcal pneumonia:  - Continue ceftriaxone, can DC azithromycin - Monitor blood cultures  NSTEMI in patient with CAD s/p PCI: No chest pain, no STEMI on ECG.  - IV heparin - Poor candidate for catheterization with severe renal insufficiency. - Aspirin. Not on BB due to borderline hypotension. Not on statin due to LFT elevation.   AKI on stage IV CKD with non-anion gap metabolic acidosis: Normal lactic acid.  - Will start isotonic bicarb IV with soft BP and no clinical evidence of overload (albeit BNP 1188).   LFT elevation: Likely related to viral infection, though pattern also suggestive of mild toxic insult (AST>ALT 2:1). - Improving. Will continue monitoring especially with remdesivir dosing. Consider U/S if worsening.   Anemia of CKD: Normocytic. Down with hemodilution.  - Continue monitoring.  - Pt reportedly had dark BM while on heparin with level of 0.90. Check FOBT and H/H.  Hypothyroidism:  - Continue synthroid  HLD:  - Hold statin with LFT elevations  HTN:  - Hold norvasc w/hypotension.  Hypoalbuminemia  DVT prophylaxis: IV heparin Code Status: DNR confirmed Family Communication: Daughter and granddaughter by phone this afternoon. I've conveyed a fear that this patient currently has a very grim prognosis (pneumococcal pneumonia, high viral titer covid-19, NSTEMI, advanced age) despite her current clinical stability. Goal  of care remains cure, though wouldn't want intubation, CRRT. Would want HFNC and BiPAP if necessary. Disposition Plan:  Status is:  Inpatient  Remains inpatient appropriate because:Inpatient level of care appropriate due to severity of illness   Dispo: The patient is from: ALF              Anticipated d/c is to: TBD based on clinical course.              Anticipated d/c date is: > 3 days              Patient currently is not medically stable to d/c.  Consultants:   Cardiology  Procedures:   Echocardiogram (pending)  Antimicrobials:  Ceftriaxone 7/21 >>   Azithromycin 7/21   Remdesivir 7/21 - 7/25  Subjective: Trouble breathing is stable, mild at rest. Having minimal chest discomfort with cough, does NOT feel like a previous heart attack she had. No previous or current leg swelling, orthopnea, palpitations. Does not know of sick contacts.   Objective: Vitals:   07/10/20 1030 07/10/20 1130 07/10/20 1200 07/10/20 1230  BP: (!) 130/67 (!) 133/68 (!) 132/67 124/65  Pulse: 73 77 74 73  Resp: 22 (!) 26 (!) 39 17  Temp:      TempSrc:      SpO2: 95% 93% 93% 93%  Weight:      Height:        Intake/Output Summary (Last 24 hours) at 07/10/2020 1315 Last data filed at 07/10/2020 1023 Gross per 24 hour  Intake 1282.65 ml  Output --  Net 1282.65 ml   Filed Weights   07/09/20 1843  Weight: 61.5 kg   Gen: 86 y.o. female in no distress Pulm: Non-labored but tachypneic breathing 2L O2. Clear to auscultation bilaterally. No wheezes or crackles. CV: Regular rate and rhythm. No murmur, rub, or gallop. No JVD, no pedal edema. GI: Abdomen soft, non-tender, non-distended, with normoactive bowel sounds. No organomegaly or masses felt. Ext: Warm, no deformities Skin: No rashes, lesions or ulcers on visualized skin Neuro: Alert and oriented, slow but normal speech. No focal neurological deficits. Psych: Judgement and insight appear normal. Mood & affect appropriate.   Data Reviewed: I have personally reviewed following labs and imaging studies  CBC: Recent Labs  Lab 07/09/20 1726 07/10/20 0559  WBC 7.3 5.4   NEUTROABS 4.6  --   HGB 10.3* 9.7*  HCT 33.4* 30.6*  MCV 97.7 96.5  PLT 186 417   Basic Metabolic Panel: Recent Labs  Lab 07/09/20 1550 07/10/20 0559  NA 137 137  K 4.6 4.1  CL 107 111  CO2 17* 14*  GLUCOSE 73 120*  BUN 35* 42*  CREATININE 1.79* 1.50*  CALCIUM 8.3* 7.8*  MG  --  2.1  PHOS  --  4.5   GFR: Estimated Creatinine Clearance: 19.1 mL/min (A) (by C-G formula based on SCr of 1.5 mg/dL (H)). Liver Function Tests: Recent Labs  Lab 07/09/20 1550 07/10/20 0559  AST 173* 132*  ALT 67* 53*  ALKPHOS 185* 149*  BILITOT 0.5 0.5  PROT 7.4 6.5  ALBUMIN 3.0* 2.6*   No results for input(s): LIPASE, AMYLASE in the last 168 hours. No results for input(s): AMMONIA in the last 168 hours. Coagulation Profile: Recent Labs  Lab 07/09/20 1550  INR 1.0   Cardiac Enzymes: No results for input(s): CKTOTAL, CKMB, CKMBINDEX, TROPONINI in the last 168 hours. BNP (last 3 results) No results for input(s): PROBNP in the last 8760 hours. HbA1C:  No results for input(s): HGBA1C in the last 72 hours. CBG: No results for input(s): GLUCAP in the last 168 hours. Lipid Profile: Recent Labs    07/09/20 1550  TRIG 124   Thyroid Function Tests: No results for input(s): TSH, T4TOTAL, FREET4, T3FREE, THYROIDAB in the last 72 hours. Anemia Panel: Recent Labs    07/09/20 1550 07/10/20 0600  FERRITIN 51 59   Urine analysis: No results found for: COLORURINE, APPEARANCEUR, LABSPEC, PHURINE, GLUCOSEU, HGBUR, BILIRUBINUR, KETONESUR, PROTEINUR, UROBILINOGEN, NITRITE, LEUKOCYTESUR Recent Results (from the past 240 hour(s))  SARS Coronavirus 2 by RT PCR (hospital order, performed in [facility] hospital lab) Nasopharyngeal Nasopharyngeal Swab     Status: Abnormal   Collection Time: 07/09/20 12:24 PM   Specimen: Nasopharyngeal Swab  Result Value Ref Range Status   SARS Coronavirus 2 POSITIVE (A) NEGATIVE Final    Comment: RESULT CALLED TO, READ BACK BY AND VERIFIED WITH: FRANKLIN,C.  RN @1330  07/09/20 BILLINGSLEY,L (NOTE) SARS-CoV-2 target nucleic acids are DETECTED  SARS-CoV-2 RNA is generally detectable in upper respiratory specimens  during the acute phase of infection.  Positive results are indicative  of the presence of the identified virus, but do not rule out bacterial infection or co-infection with other pathogens not detected by the test.  Clinical correlation with patient history and  other diagnostic information is necessary to determine patient infection status.  The expected result is negative.  Fact Sheet for Patients:   StrictlyIdeas.no   Fact Sheet for Healthcare Providers:   BankingDealers.co.za    This test is not yet approved or cleared by the [place] FDA and  has been authorized for detection and/or diagnosis of SARS-CoV-2 by FDA under an Emergency Use Authorization (EUA).  This EUA will remain in effect (meaning  this test can be used) for the duration of  the COVID-19 declaration under Section 564(b)(1) of the Act, 21 U.S.C. section 360-bbb-3(b)(1), unless the authorization is terminated or revoked sooner.  Performed at [facility], [place] [address], [place], [place] [address]       Radiology Studies: [facility] Chest Port 1 View  Result Date: 07/09/2020 CLINICAL DATA:  Cough, shortness of breath. Additional provided: Patient reports generalized body aches, shortness of breath for 3 days, sick contacts EXAM: PORTABLE CHEST 1 VIEW COMPARISON:  Prior chest radiographs 08/22/2019 and earlier FINDINGS: Heart size within normal limits. Aortic atherosclerosis. No appreciable airspace consolidation or pulmonary edema. No evidence of pleural effusion or pneumothorax. No acute bony abnormality identified. Left retrocardiac opacity suspicious for hiatal hernia. IMPRESSION: No evidence of acute cardiopulmonary abnormality. Aortic Atherosclerosis (ICD10-I70.0). Suspected hiatal hernia.  Electronically Signed   By: Kellie Simmering DO   On: 07/09/2020 12:03    Scheduled Meds:  vitamin C  500 mg Oral Daily   aspirin EC  81 mg Oral Daily   atorvastatin  10 mg Oral Daily   Ipratropium-Albuterol  2 puff Inhalation Q6H   levothyroxine  75 mcg Oral Daily   memantine  5 mg Oral Daily   methylPREDNISolone (SOLU-MEDROL) injection  0.5 mg/kg Intravenous Q12H   mometasone-formoterol  2 puff Inhalation BID   montelukast  10 mg Oral QHS   pantoprazole  40 mg Oral Daily   pramipexole  0.25 mg Oral Daily   vitamin B-12  1,000 mcg Oral Daily   zinc sulfate  220 mg Oral Daily   Continuous Infusions:  cefTRIAXone (ROCEPHIN)  IV Stopped (07/10/20 0153)   heparin 700 Units/hr (07/10/20 1023)   remdesivir 100  mg in NS 100 mL Stopped (07/10/20 0958)     LOS: 1 day   Time spent: 35 minutes.  Patrecia Pour, MD [facility] [email or web address] 07/10/2020, 1:15 PM

## 2020-07-10 NOTE — ED Notes (Signed)
Echo at bedside

## 2020-07-10 NOTE — Progress Notes (Signed)
[place] for IV heparin Indication: ACS  Allergies  Allergen Reactions  . Tenex [Guanfacine Hcl] Other (See Comments)    unknown  . Calcitonin Other (See Comments)    unknown  . Fosamax [Alendronate Sodium] Other (See Comments)    unknown  . Gatifloxacin Other (See Comments)    unknown  . Procrit [Epoetin (Alfa)] Other (See Comments)    unknown  . Verapamil Other (See Comments)    unknown    Patient Measurements: Height: 4\' 11"  (149.9 cm) Weight: 61.5 kg (135 lb 9.3 oz) IBW/kg (Calculated) : 43.2 Heparin Dosing Weight: 56.3 kg  Vital Signs: BP: 136/64 (07/22 0559) Pulse Rate: 74 (07/22 0559)  Labs: Recent Labs    07/09/20 1550 07/09/20 1726 07/10/20 0559  HGB  --  10.3* 9.7*  HCT  --  33.4* 30.6*  PLT  --  186 191  APTT 29  --   --   LABPROT 12.9  --   --   INR 1.0  --   --   HEPARINUNFRC  --   --  0.53  CREATININE 1.79*  --   --   TROPONINIHS 41,638*  --   --     Estimated Creatinine Clearance: 16 mL/min (A) (by C-G formula based on SCr of 1.79 mg/dL (H)).   Medical History: Past Medical History:  Diagnosis Date  . Chronic kidney disease (CKD), stage IV (severe) ([place])   . COPD (chronic obstructive pulmonary disease) ([place])   . Diastolic dysfunction   . Hyperlipidemia   . Hypertension   . Hypothyroidism   . 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 COVID +. High sensitivity troponin elevated at 17[address] ng/L. Pharmacy consulted for IV heparin dosing for ACS. D-dimer 2.61. Hgb 10.3 (appears at baseline), Pltc WNL. APTT 29 seconds, PT/INR 12.9/1. Patient not on anticoagulants PTA.  07/10/2020: -Heparin level therapeutic (0.53) on 700 units/hr -CBC: Hg low at 9.7, pltc WNL -No bleeding or infusion related concerns per nursing  Goal of Therapy:  Heparin level 0.3-0.7 units/ml Monitor platelets by anticoagulation  protocol: Yes   Plan:  Continue Heparin infusion at 700 units/hr Confirmatory Heparin level in 8 hours Daily CBC, heparin level while on heparin Monitor closely for s/sx of bleeding   Netta Cedars, PharmD, BCPS 07/10/2020,6:41 AM

## 2020-07-10 NOTE — ED Notes (Signed)
Pt provided dinner tray.

## 2020-07-10 NOTE — Consult Note (Addendum)
Cardiology Consultation:   Due to the COVID-19 pandemic, this visit was completed with telemedicine (audio/video) technology to reduce patient and provider exposure as well as to preserve personal protective equipment.   Patient ID: Lisa Cortez MRN: [id number]; DOB: Sep 10, 1925  Admit date: 07/09/2020 Date of Consult: 07/10/2020  Primary Care Provider: Lajean Manes, MD Primary Cardiologist: No primary care provider on file. - new to Dr. Margaretann Loveless Primary Electrophysiologist:  None    Patient Profile:   Lisa Cortez is a 86 y.o. female with a hx of reported history of CAD s/p stents, memory loss, CKD stage IV, anemia of chronic renal disease, COPD, former tobacco abuse, GERD, HTN, HLD, hypothyroidism, mild AS/AI who is being seen today for the evaluation of NSTEMI at the request of Dr. Olevia Bowens.  History of Present Illness:   Lisa Cortez has a reported history of CAD with 3 prior stents in [facility] a few years ago. No further details known, no cath report in Care Everywhere. She had an echo in our system in 08/2019 when admitted for sepsis showing normal LV function, + diastolic dysfunction, mild BAE, mild AI/AS. She resides at [facility] and reports she was one of the first wave of people to get the Covid vaccine when they were first released to the public, unclear what version.  She states she has actually had been feeling poorly for a few weeks looking back. She presented with generalized body aches, worsening dyspnea and dry cough over several days time. She said a tech had been trying to convince her to go to the hospital at [facility] but she refused. Prior to admission however, she was found on the floor unable to get up and therefore agreed to seeking care. Per admitting H/p, they also obtained a history that patient had been at family reunion recently where family member was sick. Here in the ED she was found to be hypoxic at 88% on RA and tested positive for Covid.  She received  dexamethasone 10mg , 500mg  bolus and aspirin in the ED. Initial troponin 17,855->8,122, elevated AST/ALT, elevated CRP, elevated LDH high procalcitonin of 6.33, elevated d-dimer of 2.61, BNP1 1855, and AKI with admitting Cr of 1.79 (down to 1.5 today, prior values 1.2-1.3). CXR nonacute, + aortic atherosclerosis, suspected hiatal hernia. She was started on heparin, and is to be started on remdesivir and methylprednisolone. She is also started on CAP coverage given elevated procalcitonin. She states she's currently feeling a little better. She denies any recent chest pain, except for discomfort when she coughs, no anginal-type pain.   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 Authorizing Provider  albuterol (VENTOLIN HFA) 108 (90 Base) MCG/ACT inhaler Inhale 2 puffs into the lungs every 4 (four) hours as needed. 05/25/19  Yes [provider]  amLODipine (NORVASC) 5 MG tablet Take 5 mg by mouth daily.  03/23/19  Yes [provider]  aspirin EC 81 MG tablet Take 81 mg by mouth daily.   Yes [provider]  atorvastatin (LIPITOR) 10 MG tablet Take 10 mg by mouth daily.  06/22/19  Yes [provider]  Cyanocobalamin (B-12) 2000 MCG TABS Take 1,000 mcg by mouth daily.   Yes [provider]  Fluticasone-Salmeterol (ADVAIR) 250-50 MCG/DOSE AEPB Inhale 1 puff into the lungs 2 (two) times daily. 07/31/18  Yes [provider]  levothyroxine (SYNTHROID) 75 MCG tablet Take 75 mcg by mouth daily. 08/14/19  Yes [provider]  memantine (NAMENDA) 5 MG tablet Take 5 mg by mouth daily.  10/23/18  Yes [provider]  montelukast (SINGULAIR) 10 MG tablet Take 10 mg by mouth daily. 06/18/19  Yes [provider]  omeprazole (PRILOSEC) 20 MG capsule Take 20 mg by mouth daily. 06/19/19  Yes [provider]  pramipexole (MIRAPEX) 0.25 MG tablet Take 0.25 mg by mouth daily.  06/19/19  Yes [provider]  sodium bicarbonate 650 MG tablet Take 1,300 mg by mouth 2 (two) times daily.  06/19/19  Yes [provider]  metoprolol tartrate (LOPRESSOR) 25 MG tablet Take 25 mg by mouth 2 (two) times daily.  Patient not taking: Reported on 07/09/2020 08/19/19   [provider]  mupirocin ointment (BACTROBAN) 2 % Apply 1 application topically 3 (three) times daily. Patient not taking: Reported on 07/09/2020 01/17/20   [provider]  OVER THE COUNTER MEDICATION Take 5 mLs by mouth daily. Patient not taking: Reported on 07/09/2020    [provider]  predniSONE (DELTASONE) 10 MG tablet Take 4 tabs daily for 1 day, 3 tabs daily for 2 days, 2 tabs daily for 2 days, 1 tab daily for 2 days and complete Patient not taking: Reported on 07/09/2020 08/24/19   Alma Friendly, MD  traMADol (ULTRAM) 50 MG tablet Take 50 mg by mouth daily as needed. Patient not taking: Reported on 07/09/2020 06/30/20   [provider]    Inpatient Medications: Scheduled Meds:  vitamin C  500 mg Oral Daily   aspirin EC  81 mg Oral Daily   atorvastatin  10 mg Oral Daily   Ipratropium-Albuterol  2 puff Inhalation Q6H   levothyroxine  75 mcg Oral Daily   memantine  5 mg Oral Daily   methylPREDNISolone (SOLU-MEDROL) injection  0.5 mg/kg Intravenous Q12H   mometasone-formoterol  2 puff Inhalation BID   montelukast  10 mg Oral QHS   pantoprazole  40 mg Oral Daily   pramipexole  0.25 mg Oral Daily   vitamin B-12  1,000 mcg Oral Daily   zinc sulfate  220 mg Oral  Daily   Continuous Infusions:  azithromycin Stopped (07/10/20 0153)   cefTRIAXone (ROCEPHIN)  IV Stopped (07/10/20 0153)   heparin 700 Units/hr (07/10/20 0806)   remdesivir 100 mg in NS 100 mL     PRN Meds: acetaminophen **OR** acetaminophen, albuterol, chlorpheniramine-HYDROcodone, guaiFENesin-dextromethorphan, nitroGLYCERIN, ondansetron (ZOFRAN) IV  Allergies:    Allergies  Allergen Reactions   Tenex [Guanfacine Hcl] Other (See Comments)    unknown   Calcitonin Other (See Comments)    unknown   Fosamax [Alendronate Sodium] Other (See Comments)    unknown   Gatifloxacin Other (See Comments)    unknown   Procrit [Epoetin (Alfa)] Other (See Comments)    unknown   Verapamil Other (See Comments)    unknown    Social History:   Social History   Socioeconomic History   Marital status: Single    Spouse name: Not on file   Number of children: Not on file  Years of education: Not on file   Highest education level: Not on file  Occupational History   Not on file  Tobacco Use   Smoking status: Former Smoker   Smokeless tobacco: Never Used  Scientific laboratory technician Use: Never used  Substance and Sexual Activity   Alcohol use: Yes    Comment: occassional glass of wine   Drug use: Never   Sexual activity: Not on file  Other Topics Concern   Not on file  Social History Narrative   Not on file   Social Determinants of Health   Financial Resource Strain:    Difficulty of Paying Living Expenses:   Food Insecurity:    Worried About Charity fundraiser in the Last Year:    Arboriculturist in the Last Year:   Transportation Needs:    Film/video editor (Medical):    Lack of Transportation (Non-Medical):   Physical Activity:    Days of Exercise per Week:    Minutes of Exercise per Session:   Stress:    Feeling of Stress :   Social Connections:    Frequency of Communication with Friends and Family:    Frequency of Social Gatherings  with Friends and Family:    Attends Religious Services:    Active Member of Clubs or Organizations:    Attends Music therapist:    Marital Status:   Intimate Partner Violence:    Fear of Current or Ex-Partner:    Emotionally Abused:    Physically Abused:    Sexually Abused:     Family History:   Family History  Problem Relation Age of Onset   CVA Mother    Bone cancer Brother    Dementia Brother      ROS:  Please see the history of present illness.  All other ROS reviewed and negative.     Physical Exam/Data:   Vitals:   07/10/20 0559 07/10/20 0730 07/10/20 0742 07/10/20 0800  BP: 136/64 127/64  (!) 129/97  Pulse: 74 71 76 80  Resp: (!) 23 18 (!) 35 (!) 21  Temp:      TempSrc:      SpO2: 95% 90% 92% 97%  Weight:      Height:        Intake/Output Summary (Last 24 hours) at 07/10/2020 0814 Last data filed at 07/10/2020 0806 Gross per 24 hour  Intake 1166.66 ml  Output --  Net 1166.66 ml   Last 3 Weights 07/09/2020 08/24/2019 08/23/2019  Weight (lbs) 135 lb 9.3 oz 151 lb 10.8 oz 146 lb 13.2 oz  Weight (kg) 61.5 kg 68.8 kg 66.6 kg     Body mass index is 27.38 kg/m.   VITAL SIGNS:  reviewed  General - elderly WF in no acute distress Pulm - hoarse voice, no audible wheezing, speaking in full sentences Neuro - A+Ox3, no slurred speech, answers questions appropriately albeit hard of hearing Psych - Pleasant affect  EKG:  The EKG was personally reviewed and demonstrates: NSR 84bpm, prolonged PR interval, probable LAE, nonspecific TW changes with age-indeterminate anteroseptal infarct (TWI I, avL, V5-V6), QTc 41ms   Telemetry:  Telemetry was personally reviewed and demonstrates: NSR  Relevant CV Studies: 2D Echo 08/2019  1. The left ventricle has normal systolic function with an ejection  fraction of 60-65%. The cavity size was normal. Left ventricular diastolic  Doppler parameters are consistent with impaired relaxation. Elevated left   ventricular end-diastolic pressure.  2.  The right ventricle has normal systolic function. The cavity was  normal. There is no increase in right ventricular wall thickness. Right  ventricular systolic pressure could not be assessed.  3. Left atrial size was mildly dilated.  4. Right atrial size was mildly dilated.  5. Moderate calcification of the anterior mitral valve leaflet. There is  moderate mitral annular calcification present.  6. The aortic valve was not well visualized. Aortic valve regurgitation  is mild by color flow Doppler. Mild stenosis of the aortic valve.  7. The aorta is normal unless otherwise noted.  8. The inferior vena cava was dilated in size with <50% respiratory  variability.   Laboratory Data:  Chemistry Recent Labs  Lab 07/09/20 1550 07/10/20 0559  NA 137 137  K 4.6 4.1  CL 107 111  CO2 17* 14*  GLUCOSE 73 120*  BUN 35* 42*  CREATININE 1.79* 1.50*  CALCIUM 8.3* 7.8*  GFRNONAA 24* 30*  GFRAA 28* 35*  ANIONGAP 13 12    Recent Labs  Lab 07/09/20 1550 07/10/20 0559  PROT 7.4 6.5  ALBUMIN 3.0* 2.6*  AST 173* 132*  ALT 67* 53*  ALKPHOS 185* 149*  BILITOT 0.5 0.5   Hematology Recent Labs  Lab 07/09/20 1726 07/10/20 0559  WBC 7.3 5.4  RBC 3.42* 3.17*  HGB 10.3* 9.7*  HCT 33.4* 30.6*  MCV 97.7 96.5  MCH 30.1 30.6  MCHC 30.8 31.7  RDW 14.4 14.1  PLT 186 191   Cardiac EnzymesNo results for input(s): TROPONINI in the last 168 hours. No results for input(s): TROPIPOC in the last 168 hours.  BNP Recent Labs  Lab 07/09/20 1726  BNP 1,188.5*    DDimer  Recent Labs  Lab 07/09/20 1550 07/10/20 0559  DDIMER 2.61* 1.67*    Radiology/Studies:  DG Chest Port 1 View  Result Date: 07/09/2020 CLINICAL DATA:  Cough, shortness of breath. Additional provided: Patient reports generalized body aches, shortness of breath for 3 days, sick contacts EXAM: PORTABLE CHEST 1 VIEW COMPARISON:  Prior chest radiographs 08/22/2019 and earlier  FINDINGS: Heart size within normal limits. Aortic atherosclerosis. No appreciable airspace consolidation or pulmonary edema. No evidence of pleural effusion or pneumothorax. No acute bony abnormality identified. Left retrocardiac opacity suspicious for hiatal hernia. IMPRESSION: No evidence of acute cardiopulmonary abnormality. Aortic Atherosclerosis (ICD10-I70.0). Suspected hiatal hernia. Electronically Signed   By: Kellie Simmering DO   On: 07/09/2020 12:03    Assessment and Plan:   1. Acute hypoxic respiratory failure due to Covid-19 virus infection in fully vaccinated individual with possible superimposed CAP, along with lab abnormalities of AKI on CKD, elevated d-dimer, abnormal LFTs - plan for remdesivir and steroids per IM - defer management of elevated d-dimer to primary team - if O2 requirement persists, may need to exclude PE  2. Elevated troponin, suspicious for concomitant NSTEMI with reported history of CAD - agree with heparin per pharmacy, aspirin, statin (lower dose given LFTs) - continue heparin x 48 hours (longer if there is a need to exclude PE) - hold off BB given hypotension - await echocardiogram - anticipate conservative management acutely given acute infection, CKD, advanced age, anemia, and general lack of angina but will review with MD  3. AKI on CKD stage IV - outpatient Cr appears 1.3 range, up to 1.79 on admit, improved to 1.5 this morning - continue to monitor  4. Chronic anemia - prior values appear generally 9-11, currently 9.7. - monitor H/H  5. HTN - amlodipine on hold  due to soft BP. - continue to monitor   For questions or updates, please contact [place] Please consult [email or web address] for contact info under     Signed, Charlie Pitter, PA-C  07/10/2020 8:14 AM    Due to the COVID-19 pandemic, this visit was completed with telemedicine (audio/video) technology to reduce patient and provider exposure as well as to preserve personal protective  equipment.  Discussed with Dayna Dunn PA-C.  Agree as above, with the following exceptions and changes as noted below. 86 yo covid positive female with a significantly elevated troponin of [address], no chest pain with presentation.  VITAL SIGNS:  reviewed GEN:  no acute distress RESPIRATORY:no increased work of breathing NEURO:  alert and oriented x 3, speech normal PSYCH:  normal affect  All available labs, radiology testing, previous records reviewed.   Her echocardiogram shows preserved EF but regional wall motion abnormalities.This is concerning for ischemia, however troponin is trending down now and she is being treated for covid. Medical management is indicated in setting of active covid infection. It is possible that this is a stress cardiomyopathic process. After recovery if she would like to undergo ischemic workup, we will discuss options outpatient. Agree with medication recommendations as above.   Elouise Munroe, MD 07/10/20

## 2020-07-10 NOTE — Progress Notes (Signed)
  Echocardiogram 2D Echocardiogram has been performed.  Lisa Cortez 07/10/2020, 2:49 PM

## 2020-07-10 NOTE — ED Notes (Signed)
Multiple phlebotomy attempts to obtain added labs, unsuccessful. Phlebotomy consult placed.

## 2020-07-10 NOTE — ED Notes (Signed)
[facility] called. 

## 2020-07-10 NOTE — ED Notes (Signed)
Phlebotomy called for specimen collection, advised to call back around 0400

## 2020-07-10 NOTE — ED Notes (Signed)
Provided daughter Hilda Blades with update.

## 2020-07-10 NOTE — ED Notes (Signed)
Pt provided breakfast tray.

## 2020-07-10 NOTE — ED Notes (Signed)
Provided pt's daughter Hilda Blades an update. She was very Patent attorney. I also let the pt know that I had spoken with her.

## 2020-07-11 ENCOUNTER — Inpatient Hospital Stay (HOSPITAL_COMMUNITY): Payer: Medicare Other

## 2020-07-11 LAB — BRAIN NATRIURETIC PEPTIDE: B Natriuretic Peptide: 808.2 pg/mL — ABNORMAL HIGH (ref 0.0–100.0)

## 2020-07-11 LAB — CBC
HCT: 30.1 % — ABNORMAL LOW (ref 36.0–46.0)
Hemoglobin: 9.6 g/dL — ABNORMAL LOW (ref 12.0–15.0)
MCH: 29.8 pg (ref 26.0–34.0)
MCHC: 31.9 g/dL (ref 30.0–36.0)
MCV: 93.5 fL (ref 80.0–100.0)
Platelets: 201 10*3/uL (ref 150–400)
RBC: 3.22 MIL/uL — ABNORMAL LOW (ref 3.87–5.11)
RDW: 14 % (ref 11.5–15.5)
WBC: 6.7 10*3/uL (ref 4.0–10.5)
nRBC: 0 % (ref 0.0–0.2)

## 2020-07-11 LAB — CULTURE, BLOOD (ROUTINE X 2)

## 2020-07-11 LAB — COMPREHENSIVE METABOLIC PANEL
ALT: 43 U/L (ref 0–44)
AST: 95 U/L — ABNORMAL HIGH (ref 15–41)
Albumin: 2.4 g/dL — ABNORMAL LOW (ref 3.5–5.0)
Alkaline Phosphatase: 140 U/L — ABNORMAL HIGH (ref 38–126)
Anion gap: 9 (ref 5–15)
BUN: 56 mg/dL — ABNORMAL HIGH (ref 8–23)
CO2: 20 mmol/L — ABNORMAL LOW (ref 22–32)
Calcium: 7.8 mg/dL — ABNORMAL LOW (ref 8.9–10.3)
Chloride: 107 mmol/L (ref 98–111)
Creatinine, Ser: 1.67 mg/dL — ABNORMAL HIGH (ref 0.44–1.00)
GFR calc Af Amer: 30 mL/min — ABNORMAL LOW (ref >60)
GFR calc non Af Amer: 26 mL/min — ABNORMAL LOW (ref >60)
Glucose, Bld: 109 mg/dL — ABNORMAL HIGH (ref 70–99)
Potassium: 3.7 mmol/L (ref 3.5–5.1)
Sodium: 136 mmol/L (ref 135–145)
Total Bilirubin: 0.5 mg/dL (ref 0.3–1.2)
Total Protein: 6.1 g/dL — ABNORMAL LOW (ref 6.5–8.1)

## 2020-07-11 LAB — GLUCOSE, CAPILLARY: Glucose-Capillary: 98 mg/dL (ref 70–99)

## 2020-07-11 LAB — HEPARIN LEVEL (UNFRACTIONATED): Heparin Unfractionated: 1.02 IU/mL — ABNORMAL HIGH (ref 0.30–0.70)

## 2020-07-11 LAB — C-REACTIVE PROTEIN: CRP: 2.1 mg/dL — ABNORMAL HIGH (ref <1.0)

## 2020-07-11 LAB — PROCALCITONIN: Procalcitonin: 3.62 ng/mL

## 2020-07-11 LAB — HEMOGLOBIN AND HEMATOCRIT, BLOOD
HCT: 30.5 % — ABNORMAL LOW (ref 36.0–46.0)
HCT: 24.7 % — ABNORMAL LOW (ref 36.0–46.0)
Hemoglobin: 9.4 g/dL — ABNORMAL LOW (ref 12.0–15.0)
Hemoglobin: 7.7 g/dL — ABNORMAL LOW (ref 12.0–15.0)

## 2020-07-11 LAB — D-DIMER, QUANTITATIVE: D-Dimer, Quant: 0.69 ug/mL-FEU — ABNORMAL HIGH (ref 0.00–0.50)

## 2020-07-11 LAB — MAGNESIUM: Magnesium: 2 mg/dL (ref 1.7–2.4)

## 2020-07-11 LAB — SAR COV2 SEROLOGY (COVID19)AB(IGG),IA: SARS-CoV-2 Ab, IgG: NONREACTIVE

## 2020-07-11 MED ORDER — CHLORHEXIDINE GLUCONATE CLOTH 2 % EX PADS
6.0000 | MEDICATED_PAD | Freq: Every day | CUTANEOUS | Status: DC
  Administered 2020-07-11 – 2020-07-15 (×5): 6 via TOPICAL

## 2020-07-11 MED ORDER — ISOSORBIDE MONONITRATE ER 30 MG PO TB24
30.0000 mg | ORAL_TABLET | Freq: Every day | ORAL | Status: DC
  Administered 2020-07-11 – 2020-07-15 (×5): 30 mg via ORAL
  Filled 2020-07-11 (×5): qty 1

## 2020-07-11 MED ORDER — PANTOPRAZOLE SODIUM 40 MG IV SOLR
40.0000 mg | Freq: Two times a day (BID) | INTRAVENOUS | Status: DC
  Administered 2020-07-11 – 2020-07-14 (×7): 40 mg via INTRAVENOUS
  Filled 2020-07-11 (×7): qty 40

## 2020-07-11 MED ORDER — CARVEDILOL 3.125 MG PO TABS
3.1250 mg | ORAL_TABLET | Freq: Two times a day (BID) | ORAL | Status: DC
  Administered 2020-07-12 – 2020-07-15 (×5): 3.125 mg via ORAL
  Filled 2020-07-11 (×7): qty 1

## 2020-07-11 MED ORDER — TRAZODONE HCL 50 MG PO TABS
25.0000 mg | ORAL_TABLET | Freq: Every day | ORAL | Status: DC
  Administered 2020-07-11 – 2020-07-14 (×4): 25 mg via ORAL
  Filled 2020-07-11 (×4): qty 1

## 2020-07-11 MED ORDER — FUROSEMIDE 10 MG/ML IJ SOLN
60.0000 mg | Freq: Once | INTRAMUSCULAR | Status: AC
  Administered 2020-07-11: 60 mg via INTRAVENOUS
  Filled 2020-07-11: qty 6

## 2020-07-11 MED ORDER — FUROSEMIDE 10 MG/ML IJ SOLN: 40.0000 mg | Freq: Once | INTRAMUSCULAR | Status: DC

## 2020-07-11 MED ORDER — HEPARIN (PORCINE) 25000 UT/250ML-% IV SOLN
450.0000 [IU]/h | INTRAVENOUS | Status: DC
  Administered 2020-07-11: 450 [IU]/h via INTRAVENOUS

## 2020-07-11 MED ORDER — TAMSULOSIN HCL 0.4 MG PO CAPS
0.4000 mg | ORAL_CAPSULE | Freq: Every day | ORAL | Status: DC
  Administered 2020-07-11 – 2020-07-15 (×5): 0.4 mg via ORAL
  Filled 2020-07-11 (×5): qty 1

## 2020-07-11 MED ORDER — AZITHROMYCIN 500 MG PO TABS
250.0000 mg | ORAL_TABLET | Freq: Every day | ORAL | Status: AC
  Administered 2020-07-11 – 2020-07-14 (×4): 250 mg via ORAL
  Filled 2020-07-11 (×4): qty 1

## 2020-07-11 MED ORDER — METOPROLOL TARTRATE 25 MG PO TABS: 25.0000 mg | ORAL_TABLET | Freq: Two times a day (BID) | ORAL | Status: DC

## 2020-07-11 MED ORDER — SODIUM BICARBONATE 650 MG PO TABS
650.0000 mg | ORAL_TABLET | Freq: Three times a day (TID) | ORAL | Status: DC
  Administered 2020-07-11 – 2020-07-15 (×13): 650 mg via ORAL
  Filled 2020-07-11 (×13): qty 1

## 2020-07-11 NOTE — Evaluation (Signed)
Physical Therapy Evaluation Patient Details Name: Lisa Cortez MRN: [id number] DOB: 10-Jul-1925 Today's Date: 07/11/2020   History of Present Illness  Stephaney Baugh is a 86 y.o. female with a history of CAD s/p PCI x3, HTN, HLD, stage IV CKD, hypothyroidism, mild memory loss and covid-19 vaccination in Jan-Feb 2021 who presented to [facility] 7/21 from ALF with progressive dyspnea, cough, and weakness found to be hypoxemic requiring 2L O2.  Work-up was suggestive of NSTEMI, CHF, COVID-19 infection, AKI along with elevated procalcitonin raising suspicion of bacterial infection.  Clinical Impression  Patient presents with decreased mobility due to generalized weakness, decreased balance, decreased safety awareness, and high risk for falls.  Currently min A overall except supine to sit mod A and difficulty with ambulation due to diarrhea.  She will need continued skilled PT in the acute setting and likely will need SNF level rehab at d/c.     Follow Up Recommendations SNF    Equipment Recommendations  None recommended by PT    Recommendations for Other Services       Precautions / Restrictions Precautions Precautions: Fall Precaution Comments: watch O2      Mobility  Bed Mobility Overal bed mobility: Needs Assistance Bed Mobility: Supine to Sit     Supine to sit: Mod assist;HOB elevated     General bed mobility comments: assist for lifting trunk  Transfers Overall transfer level: Needs assistance Equipment used: Rolling walker (2 wheeled) Transfers: Sit to/from [facility] Sit to Stand: Min assist Stand pivot transfers: Min assist       General transfer comment: assist for balance, up to RW and to [facility] then to bed, then to recliner  Ambulation/Gait             General Gait Details: deferred per pt due to feels diarrhea is constant  Science writer    Modified Rankin (Stroke Patients Only)       Balance Overall balance  assessment: Needs assistance   Sitting balance-Leahy Scale: Fair     Standing balance support: Bilateral upper extremity supported Standing balance-Leahy Scale: Poor Standing balance comment: UE support for balance                             Pertinent Vitals/Pain Pain Assessment: Faces Faces Pain Scale: Hurts little more Pain Location: buttocks Pain Descriptors / Indicators: Sore Pain Intervention(s): Monitored during session;Repositioned    Home Living Family/patient expects to be discharged to:: Private residence Living Arrangements: Alone Available Help at Discharge: Family;Available PRN/intermittently Type of Home: Independent living facility Home Access: Elevator     Home Layout: One level Home Equipment: Talmo - 4 wheels;Cane - single point;Shower seat - built in Additional Comments: ILF at [facility]    Prior Function Level of Independence: Independent with assistive device(s)         Comments: reports using rollator for ambulation. eats at dining facility     Hand Dominance        Extremity/Trunk Assessment   Upper Extremity Assessment Upper Extremity Assessment: Generalized weakness    Lower Extremity Assessment Lower Extremity Assessment: Generalized weakness    Cervical / Trunk Assessment Cervical / Trunk Assessment: Kyphotic  Communication   Communication: HOH  Cognition Arousal/Alertness: Awake/alert Behavior During Therapy: WFL for tasks assessed/performed Overall Cognitive Status: No family/caregiver present to determine baseline cognitive functioning (not formally tested)  General Comments: per HPT h/o dementia, but in independent living, seems oriented, but overly focused on her phone and unsafe at times leaning forward while on tall BSC to see if her phone is under the bed needing cues for safety      General Comments General comments (skin integrity, edema, etc.): on 5L O2 pt  with SpO2 81% while mobilizing or talking on phone with intermittent flattened pleth, RN aware    Exercises     Assessment/Plan    PT Assessment Patient needs continued PT services  PT Problem List Decreased strength;Decreased mobility;Decreased activity tolerance;Decreased balance;Decreased knowledge of use of DME;Cardiopulmonary status limiting activity;Decreased safety awareness       PT Treatment Interventions DME instruction;Therapeutic activities;Gait training;Therapeutic exercise;Patient/family education;Balance training;Functional mobility training    PT Goals (Current goals can be found in the Care Plan section)  Acute Rehab PT Goals Patient Stated Goal: to walk, get strength back PT Goal Formulation: With patient Time For Goal Achievement: 07/24/20    Frequency Min 3X/week   Barriers to discharge        Co-evaluation               AM-PAC PT "6 Clicks" Mobility  Outcome Measure Help needed turning from your back to your side while in a flat bed without using bedrails?: A Little Help needed moving from lying on your back to sitting on the side of a flat bed without using bedrails?: A Lot Help needed moving to and from a bed to a chair (including a wheelchair)?: A Little Help needed standing up from a chair using your arms (e.g., wheelchair or bedside chair)?: A Little Help needed to walk in hospital room?: A Lot Help needed climbing 3-5 steps with a railing? : Total 6 Click Score: 14    End of Session Equipment Utilized During Treatment: Oxygen Activity Tolerance: Patient limited by fatigue Patient left: in chair;with call bell/phone within reach;with chair alarm set   PT Visit Diagnosis: Other abnormalities of gait and mobility (R26.89);Muscle weakness (generalized) (M62.81)    Time: 7001-7494 PT Time Calculation (min) (ACUTE ONLY): 45 min   Charges:   PT Evaluation $PT Eval Moderate Complexity: 1 Mod PT Treatments $Therapeutic Activity: 23-37  mins        Magda Kiel, PT Acute Rehabilitation Services WHQPR:[phone] Office:[phone] 07/11/2020   Reginia Naas 07/11/2020, 5:09 PM

## 2020-07-11 NOTE — Progress Notes (Signed)
PROGRESS NOTE                                                                                                                                                                                                             Patient Demographics:    Lisa Cortez, is a 86 y.o. female, DOB - 1925-02-10, TMB:[id number]  Outpatient Primary MD for the patient is Lajean Manes, MD    LOS - 2  Admit date - 07/09/2020    Chief Complaint  Patient presents with  . Generalized Body Aches  . Shortness of Breath       Brief Narrative - Lisa Cortez is a 86 y.o. female with a history of CAD s/p PCI x3, HTN, HLD, stage IV CKD, hypothyroidism, mild memory loss and covid-19 vaccination in Jan-Feb 2021 who presented to [facility] 7/21 from ALF with progressive dyspnea, cough, and weakness found to be hypoxemic requiring 2L O2.  Work-up was suggestive of NSTEMI, CHF, COVID-19 infection, AKI along with elevated procalcitonin raising suspicion of bacterial infection.   Subjective:    Lisa Cortez today has, No headache, No chest pain, No abdominal pain - No Nausea, No new weakness tingling or numbness, mild cough but has shortness of breath and orthopnea.   Assessment  & Plan :     1. Acute Hypoxic Resp. Failure due to NSTEMI causing acute on chronic diastolic CHF with EF 69% due to NSTEMI along with likely insignificant acute Covid 19 Viral infection -   Patient has crackles, elevated troponin and extremely elevated BNP along with orthopnea.  She is fully vaccinated for COVID-19 and I think her COVID-19 infection is most likely asymptomatic and incidental.  Her main issue with NSTEMI placing her in acute on chronic diastolic CHF.  At this time I will treat her with IV Lasix, statin, Imdur and Coreg.  She is now having melanotic stools hence aspirin and heparin drip will be held.  Supplemental oxygen, nebulizer treatments will be continued.  Plan discussed  with family in detail of declines full comfort care.  She is DNR.  As far as COVID-19 infection is concerned she has been started on steroids and remdesivir and will finish the course.  Encouraged the patient to sit up in chair in the daytime use I-S and flutter valve for pulmonary toiletry and then prone in bed  when at night.  Will advance activity and titrate down oxygen as possible.   SpO2: 91 % O2 Flow Rate (L/min): 5 L/min  Recent Labs  Lab 07/09/20 1224 07/09/20 1550 07/09/20 1620 07/09/20 1726 07/09/20 1930 07/10/20 0559 07/10/20 0600  WBC  --   --   --  7.3  --  5.4  --   PLT  --   --   --  186  --  191  --   CRP  --  4.2*  --   --   --   --  4.2*  DDIMER  --  2.61*  --   --   --  1.67*  --   PROCALCITON  --  6.33  --   --   --   --   --   LATICACIDVEN  --   --  1.5  --  1.1  --   --   SARSCOV2NAA POSITIVE*  --   --   --   --   --   --     Hepatic Function Latest Ref Rng & Units 07/10/2020 07/09/2020 08/20/2019  Total Protein 6.5 - 8.1 g/dL 6.5 7.4 7.4  Albumin 3.5 - 5.0 g/dL 2.6(L) 3.0(L) 3.3(L)  AST 15 - 41 U/L 132(H) 173(H) 63(H)  ALT 0 - 44 U/L 53(H) 67(H) 40  Alk Phosphatase 38 - 126 U/L 149(H) 185(H) 186(H)  Total Bilirubin 0.3 - 1.2 mg/dL 0.5 0.5 0.8    2.  NSTEMI with acute on chronic diastolic CHF EF 64%.  See #1 above.  3.  Dark stools with falling H&H.  Stop heparin drip and aspirin, IV PPI, type screen, H&H closely, transfuse if hemoglobin drops below 8 and in the setting of NSTEMI.  Hold antiplatelet and anticoagulants.  4.  AKI on CKD 3.  Baseline creatinine around 1.4.  For now diurese and monitor.  5.  Urinary retention.  Foley, Flomax.  6.  Hypothyroidism.  On Synthroid.  7.  History of COPD.  No acute issues.  No wheezing.  Supportive care.  8.  Possible overlapping bacterial atypical lung infection.  Total 5 days of Rocephin and azithromycin.      Condition - Extremely Guarded  Family Communication  :  Daughter 07/11/20, grand daughter   Amret [phone] on 07/11/20  Code Status : DNR, gentle medical treatment if declines full comfort care.  Consults  : Cards  Procedures  :   TTE - 1. Left ventricular ejection fraction, by estimation, is 50 to 55%. The left ventricle has low normal function. The left ventricle demonstrates regional wall motion abnormalities (see scoring diagram/findings for description). Apical akinesis. There is moderate asymmetric left ventricular hypertrophy of the basal-septal segment. Left ventricular diastolic parameters are consistent with Grade II diastolic dysfunction (pseudonormalization). Elevated left atrial pressure.  2. Right ventricular systolic function is normal. The right ventricular size is normal. Tricuspid regurgitation signal is inadequate for assessing PA pressure.  3. The mitral valve is abnormal. Moderate mitral annular calcification. No evidence of mitral valve regurgitation.  4. The aortic valve was not well visualized. Aortic valve regurgitation is trivial. No aortic stenosis is present.    PUD Prophylaxis : IV PPI  Disposition Plan  :    Status is: Inpatient  Remains inpatient appropriate because:IV treatments appropriate due to intensity of illness or inability to take PO   Dispo: The patient is from: SNF              Anticipated d/c  is to: SNF              Anticipated d/c date is: > 3 days              Patient currently is not medically stable to d/c.   DVT Prophylaxis  :   SCDs    Lab Results  Component Value Date   PLT 191 07/10/2020    Diet :  Diet Order            DIET SOFT Room service appropriate? Yes; Fluid consistency: Thin  Diet effective now                  Inpatient Medications  Scheduled Meds: . vitamin C  500 mg Oral Daily  . atorvastatin  10 mg Oral Daily  . carvedilol  3.125 mg Oral BID WC  . furosemide  60 mg Intravenous Once  . Ipratropium-Albuterol  2 puff Inhalation Q6H  . isosorbide mononitrate  30 mg Oral Daily  . levothyroxine   75 mcg Oral Daily  . memantine  5 mg Oral Daily  . methylPREDNISolone (SOLU-MEDROL) injection  40 mg Intravenous Daily  . mometasone-formoterol  2 puff Inhalation BID  . montelukast  10 mg Oral QHS  . pantoprazole (PROTONIX) IV  40 mg Intravenous Q12H  . pramipexole  0.25 mg Oral Daily  . sodium bicarbonate  650 mg Oral TID  . tamsulosin  0.4 mg Oral Daily  . vitamin B-12  1,000 mcg Oral Daily  . zinc sulfate  220 mg Oral Daily   Continuous Infusions: . cefTRIAXone (ROCEPHIN)  IV 200 mL/hr at 07/10/20 2303  . remdesivir 100 mg in NS 100 mL 100 mg (07/11/20 0938)   PRN Meds:.acetaminophen **OR** acetaminophen, albuterol, chlorpheniramine-HYDROcodone, guaiFENesin-dextromethorphan, lip balm, nitroGLYCERIN, ondansetron (ZOFRAN) IV  Antibiotics  :    Anti-infectives (From admission, onward)   Start     Dose/Rate Route Frequency Ordered Stop   07/10/20 1000  remdesivir 100 mg in sodium chloride 0.9 % 100 mL IVPB     Discontinue     100 mg 200 mL/hr over 30 Minutes Intravenous Daily 07/09/20 2111 07/14/20 0959   07/09/20 2200  remdesivir 100 mg in sodium chloride 0.9 % 100 mL IVPB        100 mg 200 mL/hr over 30 Minutes Intravenous Every 1 hr x 2 07/09/20 2111 07/10/20 0153   07/09/20 2030  cefTRIAXone (ROCEPHIN) 2 g in sodium chloride 0.9 % 100 mL IVPB     Discontinue     2 g 200 mL/hr over 30 Minutes Intravenous Every 24 hours 07/09/20 1945 07/14/20 2029   07/09/20 2030  azithromycin (ZITHROMAX) 500 mg in sodium chloride 0.9 % 250 mL IVPB  Status:  Discontinued        500 mg 250 mL/hr over 60 Minutes Intravenous Every 24 hours 07/09/20 1945 07/10/20 1314       Time Spent in minutes  30   Lala Lund M.D on 07/11/2020 at 12:13 PM  To page go to [email or web address] - password [facility]  [facility] -  Office  [phone]   See all Orders from today for further details    Objective:   Vitals:   07/11/20 0440 07/11/20 0750 07/11/20 0819 07/11/20 0833  BP: 124/66  (!)  136/57   Pulse: 79  75   Resp: (!) 24  23   Temp: 97.9 F (36.6 C)  97.6 F (36.4 C)   TempSrc: Oral  Oral  SpO2: (!) 87% 90% 91% 91%  Weight:      Height:        Wt Readings from Last 3 Encounters:  07/09/20 61.5 kg  08/24/19 68.8 kg     Intake/Output Summary (Last 24 hours) at 07/11/2020 1213 Last data filed at 07/11/2020 1000 Gross per 24 hour  Intake 500.72 ml  Output 1300 ml  Net -799.28 ml     Physical Exam  Awake Alert, No new F.N deficits,   [place].AT,PERRAL Supple Neck,No JVD, No cervical lymphadenopathy appriciated.  Symmetrical Chest wall movement, Good air movement bilaterally, Coarse rales RRR,No Gallops,Rubs or new Murmurs, No Parasternal Heave +ve B.Sounds, Abd Soft, No tenderness, No organomegaly appriciated, No rebound - guarding or rigidity. No Cyanosis, Clubbing or edema, No new Rash or bruise      Data Review:    CBC Recent Labs  Lab 07/09/20 1726 07/10/20 0559 07/10/20 1708  WBC 7.3 5.4  --   HGB 10.3* 9.7* 9.1*  HCT 33.4* 30.6* 30.4*  PLT 186 191  --   MCV 97.7 96.5  --   MCH 30.1 30.6  --   MCHC 30.8 31.7  --   RDW 14.4 14.1  --   LYMPHSABS 1.4  --   --   MONOABS 1.3*  --   --   EOSABS 0.0  --   --   BASOSABS 0.0  --   --     Chemistries  Recent Labs  Lab 07/09/20 1550 07/10/20 0559  NA 137 137  K 4.6 4.1  CL 107 111  CO2 17* 14*  GLUCOSE 73 120*  BUN 35* 42*  CREATININE 1.79* 1.50*  CALCIUM 8.3* 7.8*  AST 173* 132*  ALT 67* 53*  ALKPHOS 185* 149*  BILITOT 0.5 0.5  MG  --  2.1  INR 1.0  --      ------------------------------------------------------------------------------------------------------------------ Recent Labs    07/09/20 1550  TRIG 124    No results found for: HGBA1C ------------------------------------------------------------------------------------------------------------------ No results for input(s): TSH, T4TOTAL, T3FREE, THYROIDAB in the last 72 hours.  Invalid input(s): FREET3  Cardiac  Enzymes No results for input(s): CKMB, TROPONINI, MYOGLOBIN in the last 168 hours.  Invalid input(s): CK ------------------------------------------------------------------------------------------------------------------    Component Value Date/Time   BNP 1,188.5 (H) 07/09/2020 1726    Micro Results Recent Results (from the past 240 hour(s))  SARS Coronavirus 2 by RT PCR (hospital order, performed in [facility] hospital lab) Nasopharyngeal Nasopharyngeal Swab     Status: Abnormal   Collection Time: 07/09/20 12:24 PM   Specimen: Nasopharyngeal Swab  Result Value Ref Range Status   SARS Coronavirus 2 POSITIVE (A) NEGATIVE Final    Comment: RESULT CALLED TO, READ BACK BY AND VERIFIED WITH: FRANKLIN,C. RN '@1330'  07/09/20 BILLINGSLEY,L (NOTE) SARS-CoV-2 target nucleic acids are DETECTED  SARS-CoV-2 RNA is generally detectable in upper respiratory specimens  during the acute phase of infection.  Positive results are indicative  of the presence of the identified virus, but do not rule out bacterial infection or co-infection with other pathogens not detected by the test.  Clinical correlation with patient history and  other diagnostic information is necessary to determine patient infection status.  The expected result is negative.  Fact Sheet for Patients:   StrictlyIdeas.no   Fact Sheet for Healthcare Providers:   BankingDealers.co.za    This test is not yet approved or cleared by the [place] FDA and  has been authorized for detection and/or diagnosis of SARS-CoV-2 by FDA under  an Emergency Use Authorization (EUA).  This EUA will remain in effect (meaning  this test can be used) for the duration of  the COVID-19 declaration under Section 564(b)(1) of the Act, 21 U.S.C. section 360-bbb-3(b)(1), unless the authorization is terminated or revoked sooner.  Performed at [facility], [place] [address] [place],  [place] [address]   Blood Culture (routine x 2)     Status: Abnormal   Collection Time: 07/09/20  3:50 PM   Specimen: BLOOD LEFT HAND  Result Value Ref Range Status   Specimen Description   Final    BLOOD LEFT HAND Performed at [facility] [address] [place], [place] [address]    Special Requests   Final    BOTTLES DRAWN AEROBIC ONLY Blood Culture results may not be optimal due to an inadequate volume of blood received in culture bottles Performed at [facility] [address] [place], [place] [address]    Culture  Setup Time   Final    AEROBIC BOTTLE ONLY GRAM POSITIVE COCCI IN CLUSTERS CRITICAL RESULT CALLED TO, READ BACK BY AND VERIFIED WITH: Melodye Ped PHARMD [id number] 07/10/20 A BROWNING    Culture (A)  Final    STAPHYLOCOCCUS SPECIES (COAGULASE NEGATIVE) THE SIGNIFICANCE OF ISOLATING THIS ORGANISM FROM A SINGLE SET OF BLOOD CULTURES WHEN MULTIPLE SETS ARE DRAWN IS UNCERTAIN. PLEASE NOTIFY THE MICROBIOLOGY DEPARTMENT WITHIN ONE WEEK IF SPECIATION AND SENSITIVITIES ARE REQUIRED. Performed at [facility], [place] [address] [place], [place] [address]    Report Status 07/11/2020 FINAL  Final  Blood Culture ID Panel (Reflexed)     Status: Abnormal   Collection Time: 07/09/20  3:50 PM  Result Value Ref Range Status   Enterococcus species NOT DETECTED NOT DETECTED Final   Listeria monocytogenes NOT DETECTED NOT DETECTED Final   Staphylococcus species DETECTED (A) NOT DETECTED Final    Comment: Methicillin (oxacillin) resistant coagulase negative staphylococcus. Possible blood culture contaminant (unless isolated from more than one blood culture draw or clinical case suggests pathogenicity). No antibiotic treatment is indicated for blood  culture contaminants. CRITICAL RESULT CALLED TO, READ BACK BY AND VERIFIED WITH: Melodye Ped PHARMD [id number] 07/10/20 A BROWNING    Staphylococcus aureus (BCID) NOT DETECTED NOT DETECTED Final   Methicillin resistance  DETECTED (A) NOT DETECTED Final    Comment: CRITICAL RESULT CALLED TO, READ BACK BY AND VERIFIED WITH: Melodye Ped PHARMD [id number] 07/10/20 A BROWNING    Streptococcus species NOT DETECTED NOT DETECTED Final   Streptococcus agalactiae NOT DETECTED NOT DETECTED Final   Streptococcus pneumoniae NOT DETECTED NOT DETECTED Final   Streptococcus pyogenes NOT DETECTED NOT DETECTED Final   Acinetobacter baumannii NOT DETECTED NOT DETECTED Final   Enterobacteriaceae species NOT DETECTED NOT DETECTED Final   Enterobacter cloacae complex NOT DETECTED NOT DETECTED Final   Escherichia coli NOT DETECTED NOT DETECTED Final   Klebsiella oxytoca NOT DETECTED NOT DETECTED Final   Klebsiella pneumoniae NOT DETECTED NOT DETECTED Final   Proteus species NOT DETECTED NOT DETECTED Final   Serratia marcescens NOT DETECTED NOT DETECTED Final   Haemophilus influenzae NOT DETECTED NOT DETECTED Final   Neisseria meningitidis NOT DETECTED NOT DETECTED Final   Pseudomonas aeruginosa NOT DETECTED NOT DETECTED Final   Candida albicans NOT DETECTED NOT DETECTED Final   Candida glabrata NOT DETECTED NOT DETECTED Final   Candida krusei NOT DETECTED NOT DETECTED Final   Candida parapsilosis NOT DETECTED NOT DETECTED Final   Candida tropicalis  NOT DETECTED NOT DETECTED Final    Comment: Performed at [facility], [place] [address] [place], [place] [address]  MRSA PCR Screening     Status: None   Collection Time: 07/10/20  8:25 PM   Specimen: Nasal Mucosa; Nasopharyngeal  Result Value Ref Range Status   MRSA by PCR NEGATIVE NEGATIVE Final    Comment:        The GeneXpert MRSA Assay (FDA approved for NASAL specimens only), is one component of a comprehensive MRSA colonization surveillance program. It is not intended to diagnose MRSA infection nor to guide or monitor treatment for MRSA infections. Performed at [facility], [place] [address] [place], [place] [address]     Radiology Reports DG Chest [place] 1  View  Result Date: 07/11/2020 CLINICAL DATA:  Shortness of breath. EXAM: PORTABLE CHEST 1 VIEW COMPARISON:  07/10/2020. FINDINGS: Cardiomegaly. Bilateral interstitial prominence, right side greater than left noted. Interstitial prominence is increased from prior exam. Findings suggest interstitial edema and/or pneumonitis. Tiny bilateral pleural effusions can not be excluded. No pneumothorax. IMPRESSION: 1.  Cardiomegaly. 2. Increasing bilateral interstitial prominence, right side greater than left. Findings suggest interstitial edema and or pneumonitis. Tiny bilateral pleural effusions can not be excluded. Electronically Signed   By: Marcello Moores  Register   On: 07/11/2020 09:37   DG CHEST PORT 1 VIEW  Result Date: 07/10/2020 CLINICAL DATA:  COVID positive EXAM: PORTABLE CHEST 1 VIEW COMPARISON:  07/09/2020 FINDINGS: Small area of increased density at the right lung base. Stable interstitial prominence. No new significant pleural effusion. No pneumothorax. Stable heart size. Probable hiatal hernia. IMPRESSION: New small area of patchy atelectasis/consolidation at the right lung base. Electronically Signed   By: Macy Mis M.D.   On: 07/10/2020 15:19   DG Chest Port 1 View  Result Date: 07/09/2020 CLINICAL DATA:  Cough, shortness of breath. Additional provided: Patient reports generalized body aches, shortness of breath for 3 days, sick contacts EXAM: PORTABLE CHEST 1 VIEW COMPARISON:  Prior chest radiographs 08/22/2019 and earlier FINDINGS: Heart size within normal limits. Aortic atherosclerosis. No appreciable airspace consolidation or pulmonary edema. No evidence of pleural effusion or pneumothorax. No acute bony abnormality identified. Left retrocardiac opacity suspicious for hiatal hernia. IMPRESSION: No evidence of acute cardiopulmonary abnormality. Aortic Atherosclerosis (ICD10-I70.0). Suspected hiatal hernia. Electronically Signed   By: Kellie Simmering DO   On: 07/09/2020 12:03   ECHOCARDIOGRAM  COMPLETE  Result Date: 07/10/2020    ECHOCARDIOGRAM REPORT   Patient Name:   Lisa Cortez Date of Exam: 07/10/2020 Medical Rec #:  [id number]    Height:       59.0 in Accession #:    [id number]   Weight:       135.6 lb Date of Birth:  12-Aug-1925     BSA:          1.563 m Patient Age:    56 years     BP:           119/60 mmHg Patient Gender: F            HR:           75 bpm. Exam Location:  Inpatient Procedure: 2D Echo Indications:    acute coronary Syndrome i24.9                  Acute MI 410  History:        Patient has prior history of Echocardiogram examinations, most  recent 08/22/2019. COPD; Risk Factors:Hypertension, Dyslipidemia                 and Former Smoker. AS. AI.  Sonographer:    Jannett Celestine RDCS (AE) Referring Phys: [id number] [place]  Sonographer Comments: Image acquisition challenging due to respiratory motion. restricted mobility IMPRESSIONS  1. Left ventricular ejection fraction, by estimation, is 50 to 55%. The left ventricle has low normal function. The left ventricle demonstrates regional wall motion abnormalities (see scoring diagram/findings for description). Apical akinesis. There is moderate asymmetric left ventricular hypertrophy of the basal-septal segment. Left ventricular diastolic parameters are consistent with Grade II diastolic dysfunction (pseudonormalization). Elevated left atrial pressure.  2. Right ventricular systolic function is normal. The right ventricular size is normal. Tricuspid regurgitation signal is inadequate for assessing PA pressure.  3. The mitral valve is abnormal. Moderate mitral annular calcification. No evidence of mitral valve regurgitation.  4. The aortic valve was not well visualized. Aortic valve regurgitation is trivial. No aortic stenosis is present. FINDINGS  Left Ventricle: Left ventricular ejection fraction, by estimation, is 50 to 55%. The left ventricle has low normal function. The left ventricle demonstrates regional wall  motion abnormalities. The left ventricular internal cavity size was small. There is moderate asymmetric left ventricular hypertrophy of the basal-septal segment. Left ventricular diastolic parameters are consistent with Grade II diastolic dysfunction (pseudonormalization). Elevated left atrial pressure.  LV Wall Scoring: The apical septal segment, apical anterior segment, and apex are akinetic. The anterior wall, entire lateral wall, anterior septum, entire inferior wall, mid inferoseptal segment, and basal inferoseptal segment are normal. Right Ventricle: The right ventricular size is normal. Right vetricular wall thickness was not assessed. Right ventricular systolic function is normal. Tricuspid regurgitation signal is inadequate for assessing PA pressure. Left Atrium: Left atrial size was normal in size. Right Atrium: Right atrial size was not well visualized. Pericardium: Trivial pericardial effusion is present. Mitral Valve: The mitral valve is abnormal. Moderate mitral annular calcification. No evidence of mitral valve regurgitation. Tricuspid Valve: The tricuspid valve is normal in structure. Tricuspid valve regurgitation is trivial. Aortic Valve: The aortic valve was not well visualized. Aortic valve regurgitation is trivial. No aortic stenosis is present. Aortic valve mean gradient measures 5.0 mmHg. Aortic valve peak gradient measures 10.0 mmHg. Aortic valve area, by VTI measures 0.89 cm. Pulmonic Valve: The pulmonic valve was not well visualized. Pulmonic valve regurgitation is not visualized. Aorta: The aortic root is normal in size and structure. IAS/Shunts: The interatrial septum was not well visualized.  LEFT VENTRICLE PLAX 2D LVIDd:         3.30 cm  Diastology LVIDs:         2.20 cm  LV e' lateral:   4.79 cm/s LV PW:         1.10 cm  LV E/e' lateral: 14.8 LV IVS:        1.60 cm  LV e' medial:    3.15 cm/s LVOT diam:     1.80 cm  LV E/e' medial:  22.6 LV SV:         31 LV SV Index:   20 LVOT Area:      2.54 cm  RIGHT VENTRICLE RV S prime:     10.80 cm/s TAPSE (M-mode): 1.7 cm LEFT ATRIUM             Index LA diam:        3.10 cm 1.98 cm/m LA Vol (A2C):   35.9 ml 22.96  ml/m LA Vol (A4C):   30.4 ml 19.44 ml/m LA Biplane Vol: 32.3 ml 20.66 ml/m  AORTIC VALVE AV Area (Vmax):    1.30 cm AV Area (Vmean):   1.07 cm AV Area (VTI):     0.89 cm AV Vmax:           158.00 cm/s AV Vmean:          112.000 cm/s AV VTI:            0.345 m AV Peak Grad:      10.0 mmHg AV Mean Grad:      5.0 mmHg LVOT Vmax:         80.80 cm/s LVOT Vmean:        47.300 cm/s LVOT VTI:          0.121 m LVOT/AV VTI ratio: 0.35  AORTA Ao Root diam: 2.70 cm MITRAL VALVE MV Area (PHT): 3.60 cm     SHUNTS MV Decel Time: 211 msec     Systemic VTI:  0.12 m MV E velocity: 71.10 cm/s   Systemic Diam: 1.80 cm MV A velocity: 120.00 cm/s MV E/A ratio:  0.59 Oswaldo Milian MD Electronically signed by Oswaldo Milian MD Signature Date/Time: 07/10/2020/8:45:41 PM    Final

## 2020-07-11 NOTE — Progress Notes (Signed)
[place] for IV heparin Indication: ACS/NSTEMI   Assessment: 65 y/oF who presented with cough, body aches, shortness of breath x 3 days. COVID +. High sensitivity troponin elevated at 17855 ng/L. Pharmacy consulted for IV heparin dosing for ACS. Heparin level this am 1.02 units/ml.  Hg now 9.1  Goal of Therapy:  Heparin level 0.3-0.7 units/ml Monitor platelets by anticoagulation protocol: Yes   Plan:  Hold heparin for 1 hour Restart at 450 units/hr Heparin level 6-8 hours after rate change Daily CBC, heparin level while on heparin Monitor closely for s/sx of bleeding F/u FOBT and H/H   Thanks for allowing pharmacy to be a part of this patient's care.  Excell Seltzer, PharmD Clinical Pharmacist 07/11/2020,2:49 AM

## 2020-07-11 NOTE — Progress Notes (Signed)
CSW confirmed with [name] that patient lives at their [facility]. Will follow for PT recommendations.  Naphtali Riede LCSW

## 2020-07-11 NOTE — TOC Initial Note (Signed)
Transition of Care [facility]) - Initial/Assessment Note    Patient Details  Name: Lisa Cortez MRN: [id number] Date of Birth: Feb 13, 1925  Transition of [facility]) CM/SW Contact:    Verdell Carmine, RN Phone Number: 07/11/2020, 9:08 AM  Clinical Narrative:                 Patient admitted for NSTEMI SHOB Positive COVID. Has had vaccination first wave beginning of the year.  Resides at [facility] Hilda Blades daughter is bringing in [place] paperwork today. Patient is a DNR. She attended a family reunion recently someone was sick at that event. She started to have cough shortness of breath, would not come in for a few days. Fell at [facility]. Positive COVID, high troponin. Started on Ramdesivir . Plhn to return to NH, however right now [facility] has no beds.   Expected Discharge Plan: Skilled Nursing Facility Barriers to Discharge: Continued Medical Work up   Patient Goals and CMS Choice        Expected Discharge Plan and Services Expected Discharge Plan: [place] In-house Referral: Clinical Social Work Discharge Planning Services: CM Consult   Living arrangements for the past 2 months: [place]                                      Prior Living Arrangements/Services Living arrangements for the past 2 months: [place] Lives with:: Facility Resident Patient language and need for interpreter reviewed:: Yes        Need for Family Participation in Patient Care: Yes (Comment) Care giver support system in place?: Yes (comment)   Criminal Activity/Legal Involvement Pertinent to Current Situation/Hospitalization: No - Comment as needed  Activities of Daily Living Home Assistive Devices/Equipment: Environmental consultant (specify type), Eyeglasses ADL Screening (condition at time of admission) Patient's cognitive ability adequate to safely complete daily activities?: Yes Is the patient deaf or have difficulty hearing?: Yes Does the patient have difficulty  seeing, even when wearing glasses/contacts?: No Does the patient have difficulty concentrating, remembering, or making decisions?: No Patient able to express need for assistance with ADLs?: Yes Does the patient have difficulty dressing or bathing?: No Independently performs ADLs?: No Communication: Independent Dressing (OT): Independent Grooming: Independent Feeding: Independent Bathing: Independent Toileting: Needs assistance Is this a change from baseline?: Pre-admission baseline In/Out Bed: Needs assistance Is this a change from baseline?: Pre-admission baseline Walks in Home: Needs assistance Is this a change from baseline?: Pre-admission baseline Does the patient have difficulty walking or climbing stairs?: Yes Weakness of Legs: Both Weakness of Arms/Hands: Both  Permission Sought/Granted      Share Information with NAME: Debra Daughter POA           Emotional Assessment       Orientation: : Fluctuating Orientation (Suspected and/or reported Sundowners) Alcohol / Substance Use: Not Applicable Psych Involvement: No (comment)  Admission diagnosis:  Streptococcus pneumoniae infection [A49.1] NSTEMI (non-ST elevated myocardial infarction) ([place]) [I21.4] COVID-19 virus infection [U07.1] Acute respiratory disease due to COVID-19 virus [U07.1, J06.9] Patient Active Problem List   Diagnosis Date Noted  . NSTEMI (non-ST elevated myocardial infarction) ([place]) 07/09/2020  . Chronic kidney disease (CKD), stage IV (severe) ([place])   . Hyperlipidemia   . Hypothyroidism   . Normocytic anemia   . Transaminitis   . Diastolic dysfunction   . Acute respiratory disease due to COVID-19 virus   . Sepsis ([place]) 08/20/2019  .  Pneumonia 08/20/2019  . COPD with acute exacerbation ([place]) 08/20/2019  . Acute on chronic respiratory failure with hypoxia ([place]) 08/20/2019  . Chest pain 08/20/2019  . Nausea and vomiting 08/20/2019  . Macrocytic anemia 08/20/2019  . Hypertension 08/20/2019  .  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 08/20/2019   PCP:  Lajean Manes, MD Pharmacy:   [facility] [phone] - [place], [place] - [address] [place] [address] Phone: [phone] Fax: [phone]     Social Determinants of Health (SDOH) Interventions    Readmission Risk Interventions Readmission Risk Prevention Plan 08/24/2019  Transportation Screening Complete  HRI or [place] Complete  Social Work Consult for [place] Planning/Counseling Complete  Palliative Care Screening Not Applicable  Medication Review Press photographer) Complete

## 2020-07-12 ENCOUNTER — Inpatient Hospital Stay (HOSPITAL_COMMUNITY): Payer: Medicare Other

## 2020-07-12 LAB — CBC
HCT: 27.8 % — ABNORMAL LOW (ref 36.0–46.0)
HCT: 31.7 % — ABNORMAL LOW (ref 36.0–46.0)
HCT: 29.9 % — ABNORMAL LOW (ref 36.0–46.0)
Hemoglobin: 8.7 g/dL — ABNORMAL LOW (ref 12.0–15.0)
Hemoglobin: 10.3 g/dL — ABNORMAL LOW (ref 12.0–15.0)
Hemoglobin: 9.8 g/dL — ABNORMAL LOW (ref 12.0–15.0)
MCH: 30 pg (ref 26.0–34.0)
MCH: 29.8 pg (ref 26.0–34.0)
MCH: 29.7 pg (ref 26.0–34.0)
MCHC: 31.3 g/dL (ref 30.0–36.0)
MCHC: 32.5 g/dL (ref 30.0–36.0)
MCHC: 32.8 g/dL (ref 30.0–36.0)
MCV: 95.9 fL (ref 80.0–100.0)
MCV: 91.6 fL (ref 80.0–100.0)
MCV: 90.6 fL (ref 80.0–100.0)
Platelets: 184 10*3/uL (ref 150–400)
Platelets: 171 10*3/uL (ref 150–400)
Platelets: 164 10*3/uL (ref 150–400)
RBC: 2.9 MIL/uL — ABNORMAL LOW (ref 3.87–5.11)
RBC: 3.46 MIL/uL — ABNORMAL LOW (ref 3.87–5.11)
RBC: 3.3 MIL/uL — ABNORMAL LOW (ref 3.87–5.11)
RDW: 14.1 % (ref 11.5–15.5)
RDW: 14.6 % (ref 11.5–15.5)
RDW: 14.5 % (ref 11.5–15.5)
WBC: 3.7 10*3/uL — ABNORMAL LOW (ref 4.0–10.5)
WBC: 4 10*3/uL (ref 4.0–10.5)
WBC: 4.1 10*3/uL (ref 4.0–10.5)
nRBC: 0 % (ref 0.0–0.2)
nRBC: 0 % (ref 0.0–0.2)
nRBC: 0 % (ref 0.0–0.2)

## 2020-07-12 LAB — MAGNESIUM: Magnesium: 2.1 mg/dL (ref 1.7–2.4)

## 2020-07-12 LAB — PREPARE RBC (CROSSMATCH)

## 2020-07-12 LAB — COMPREHENSIVE METABOLIC PANEL
ALT: 42 U/L (ref 0–44)
AST: 72 U/L — ABNORMAL HIGH (ref 15–41)
Albumin: 2.3 g/dL — ABNORMAL LOW (ref 3.5–5.0)
Alkaline Phosphatase: 116 U/L (ref 38–126)
Anion gap: 13 (ref 5–15)
BUN: 72 mg/dL — ABNORMAL HIGH (ref 8–23)
CO2: 18 mmol/L — ABNORMAL LOW (ref 22–32)
Calcium: 7.7 mg/dL — ABNORMAL LOW (ref 8.9–10.3)
Chloride: 107 mmol/L (ref 98–111)
Creatinine, Ser: 2.25 mg/dL — ABNORMAL HIGH (ref 0.44–1.00)
GFR calc Af Amer: 21 mL/min — ABNORMAL LOW (ref >60)
GFR calc non Af Amer: 18 mL/min — ABNORMAL LOW (ref >60)
Glucose, Bld: 142 mg/dL — ABNORMAL HIGH (ref 70–99)
Potassium: 3.6 mmol/L (ref 3.5–5.1)
Sodium: 138 mmol/L (ref 135–145)
Total Bilirubin: 0.3 mg/dL (ref 0.3–1.2)
Total Protein: 5.7 g/dL — ABNORMAL LOW (ref 6.5–8.1)

## 2020-07-12 LAB — D-DIMER, QUANTITATIVE: D-Dimer, Quant: 0.68 ug/mL-FEU — ABNORMAL HIGH (ref 0.00–0.50)

## 2020-07-12 LAB — BRAIN NATRIURETIC PEPTIDE: B Natriuretic Peptide: 476 pg/mL — ABNORMAL HIGH (ref 0.0–100.0)

## 2020-07-12 LAB — PROCALCITONIN: Procalcitonin: 3.38 ng/mL

## 2020-07-12 LAB — C-REACTIVE PROTEIN: CRP: 2.5 mg/dL — ABNORMAL HIGH (ref <1.0)

## 2020-07-12 MED ORDER — SODIUM CHLORIDE 0.9 % IV SOLN
80.0000 mg | Freq: Once | INTRAVENOUS | Status: AC
  Administered 2020-07-12: 80 mg via INTRAVENOUS
  Filled 2020-07-12: qty 80

## 2020-07-12 MED ORDER — TECHNETIUM TC 99M-LABELED RED BLOOD CELLS IV KIT: 25.0000 | PACK | Freq: Once | INTRAVENOUS | Status: DC | PRN

## 2020-07-12 MED ORDER — LOPERAMIDE HCL 2 MG PO CAPS
4.0000 mg | ORAL_CAPSULE | Freq: Four times a day (QID) | ORAL | Status: DC | PRN
  Administered 2020-07-12 – 2020-07-13 (×3): 4 mg via ORAL
  Filled 2020-07-12 (×3): qty 2

## 2020-07-12 MED ORDER — GERHARDT'S BUTT CREAM
TOPICAL_CREAM | Freq: Three times a day (TID) | CUTANEOUS | Status: DC
  Administered 2020-07-14: 1 via TOPICAL
  Filled 2020-07-12: qty 1

## 2020-07-12 MED ORDER — METHYLPREDNISOLONE SODIUM SUCC 40 MG IJ SOLR
20.0000 mg | Freq: Every day | INTRAMUSCULAR | Status: DC
  Administered 2020-07-13 – 2020-07-14 (×2): 20 mg via INTRAVENOUS
  Filled 2020-07-12 (×2): qty 1

## 2020-07-12 MED ORDER — IPRATROPIUM-ALBUTEROL 20-100 MCG/ACT IN AERS
2.0000 | INHALATION_SPRAY | Freq: Four times a day (QID) | RESPIRATORY_TRACT | Status: DC | PRN
  Administered 2020-07-12 – 2020-07-13 (×2): 2 via RESPIRATORY_TRACT

## 2020-07-12 MED ORDER — TRAMADOL HCL 50 MG PO TABS
50.0000 mg | ORAL_TABLET | Freq: Once | ORAL | Status: AC
  Administered 2020-07-12: 50 mg via ORAL
  Filled 2020-07-12: qty 1

## 2020-07-12 MED ORDER — FUROSEMIDE 10 MG/ML IJ SOLN
40.0000 mg | Freq: Once | INTRAMUSCULAR | Status: AC
  Administered 2020-07-12: 40 mg via INTRAVENOUS
  Filled 2020-07-12: qty 4

## 2020-07-12 MED ORDER — SODIUM CHLORIDE 0.9% IV SOLUTION: Freq: Once | INTRAVENOUS | Status: AC

## 2020-07-12 MED ORDER — SODIUM CHLORIDE 0.9 % IV SOLN
8.0000 mg/h | INTRAVENOUS | Status: AC
  Administered 2020-07-12 – 2020-07-14 (×4): 8 mg/h via INTRAVENOUS
  Filled 2020-07-12 (×6): qty 80

## 2020-07-12 NOTE — Progress Notes (Signed)
PROGRESS NOTE                                                                                                                                                                                                             Patient Demographics:    Lisa Cortez, is a 86 y.o. female, DOB - [birth year], DCV:[id number]  Outpatient Primary MD for the patient is Lajean Manes, MD    LOS - 3  Admit date - 07/09/2020    Chief Complaint  Patient presents with  . Generalized Body Aches  . Shortness of Breath       Brief Narrative - Lisa Cortez is a 86 y.o. female with a history of CAD s/p PCI x3, HTN, HLD, stage IV CKD, hypothyroidism, mild memory loss and covid-19 vaccination in Jan-Feb 2021 who presented to [facility] 7/21 from ALF with progressive dyspnea, cough, and weakness found to be hypoxemic requiring 2L O2.  Work-up was suggestive of NSTEMI, CHF, COVID-19 infection, AKI along with elevated procalcitonin raising suspicion of bacterial infection.   Subjective:   Patient in bed, appears comfortable, denies any headache, no fever, no chest pain or pressure, improved shortness of breath , no abdominal pain. No focal weakness.  Still having dark stools.   Assessment  & Plan :     1. Acute Hypoxic Resp. Failure due to NSTEMI causing acute on chronic diastolic CHF with EF 75% due to NSTEMI along with likely insignificant acute Covid 19 Viral infection -   Patient has crackles, elevated troponin and extremely elevated BNP along with orthopnea.  She is fully vaccinated for COVID-19 and I think her COVID-19 infection is most likely asymptomatic and incidental.  Her main issue with NSTEMI placing her in acute on chronic diastolic CHF.  He was started on IV Lasix, statin, Imdur and Coreg with much improvement in shortness of breath.  2 ongoing melanotic stools aspirin and heparin drip will be held.  Supplemental oxygen, nebulizer treatments will be  continued.  Plan discussed with family in detail of declines full comfort care.  She is DNR.  As far as COVID-19 infection is concerned she has been started on steroids and remdesivir and will finish the course.  Encouraged the patient to sit up in chair in the daytime use I-S and flutter valve for pulmonary toiletry and then prone in bed  when at night.  Will advance activity and titrate down oxygen as possible.    SpO2: 91 % O2 Flow Rate (L/min): 4 L/min  Recent Labs  Lab 07/09/20 1224 07/09/20 1550 07/09/20 1620 07/09/20 1726 07/09/20 1930 07/10/20 0559 07/10/20 0600 07/11/20 1336 07/12/20 0426  WBC  --   --   --  7.3  --  5.4  --  6.7 3.7*  PLT  --   --   --  186  --  191  --  201 184  CRP  --  4.2*  --   --   --   --  4.2* 2.1* 2.5*  DDIMER  --  2.61*  --   --   --  1.67*  --  0.69* 0.68*  PROCALCITON  --  6.33  --   --   --   --   --  3.62 3.38  LATICACIDVEN  --   --  1.5  --  1.1  --   --   --   --   SARSCOV2NAA POSITIVE*  --   --   --   --   --   --   --   --     Hepatic Function Latest Ref Rng & Units 07/12/2020 07/11/2020 07/10/2020  Total Protein 6.5 - 8.1 g/dL 5.7(L) 6.1(L) 6.5  Albumin 3.5 - 5.0 g/dL 2.3(L) 2.4(L) 2.6(L)  AST 15 - 41 U/L 72(H) 95(H) 132(H)  ALT 0 - 44 U/L 42 43 53(H)  Alk Phosphatase 38 - 126 U/L 116 140(H) 149(H)  Total Bilirubin 0.3 - 1.2 mg/dL 0.3 0.5 0.5    2.  NSTEMI with acute on chronic diastolic CHF EF 70%.  See #1 above.  3.  Ongoing upper GI bleed causing dark stools with falling H&H.  Heparin drip and aspirin were stopped on 07/11/2020, she is s/p 1 unit packed RBC transfusion on 07/12/2020, IV PPI drip, clear liquids, will get tagged RBC scan to localize bleeding.  Not a ideal candidate for EGD at this point due to her advanced age and comorbidities, if bleeding continues then we will involve GI as a last resort.  Discussed the plan with granddaughter who is a nephrologist.  4.  AKI on CKD 3.  Baseline creatinine around 1.4.  Worse due to  hypotension acute blood loss, transfuse and monitor.  5.  Urinary retention.  Foley, Flomax.  6.  Hypothyroidism.  On Synthroid.  7.  History of COPD.  No acute issues.  No wheezing.  Supportive care.  8.  Possible overlapping bacterial atypical lung infection.  Total 5 days of Rocephin and azithromycin.  9.  Elevated LFTs due to hepatic congestion from CHF.  Improving.    Condition - Extremely Guarded  Family Communication  :  Daughter 07/11/20, grand daughter  Amret [phone] on 07/11/20, 07/12/20  Code Status : DNR, gentle medical treatment if declines full comfort care.  Consults  : Cards  Procedures  :   Tagged RBC scan -  TTE - 1. Left ventricular ejection fraction, by estimation, is 50 to 55%. The left ventricle has low normal function. The left ventricle demonstrates regional wall motion abnormalities (see scoring diagram/findings for description). Apical akinesis. There is moderate asymmetric left ventricular hypertrophy of the basal-septal segment. Left ventricular diastolic parameters are consistent with Grade II diastolic dysfunction (pseudonormalization). Elevated left atrial pressure.  2. Right ventricular systolic function is normal. The right ventricular size is normal. Tricuspid regurgitation signal is inadequate for assessing PA  pressure.  3. The mitral valve is abnormal. Moderate mitral annular calcification. No evidence of mitral valve regurgitation.  4. The aortic valve was not well visualized. Aortic valve regurgitation is trivial. No aortic stenosis is present.    PUD Prophylaxis : IV PPI  Disposition Plan  :    Status is: Inpatient  Remains inpatient appropriate because:IV treatments appropriate due to intensity of illness or inability to take PO   Dispo: The patient is from: SNF              Anticipated d/c is to: SNF              Anticipated d/c date is: > 3 days              Patient currently is not medically stable to d/c.   DVT Prophylaxis  :    SCDs    Lab Results  Component Value Date   PLT 184 07/12/2020    Diet :  Diet Order            Diet clear liquid Room service appropriate? Yes; Fluid consistency: Thin  Diet effective now                  Inpatient Medications  Scheduled Meds: . vitamin C  500 mg Oral Daily  . atorvastatin  10 mg Oral Daily  . azithromycin  250 mg Oral Daily  . carvedilol  3.125 mg Oral BID WC  . Chlorhexidine Gluconate Cloth  6 each Topical Daily  . furosemide  40 mg Intravenous Once  . Gerhardt's butt cream   Topical TID  . Ipratropium-Albuterol  2 puff Inhalation Q6H  . isosorbide mononitrate  30 mg Oral Daily  . levothyroxine  75 mcg Oral Daily  . memantine  5 mg Oral Daily  . [START ON 07/13/2020] methylPREDNISolone (SOLU-MEDROL) injection  20 mg Intravenous Daily  . mometasone-formoterol  2 puff Inhalation BID  . montelukast  10 mg Oral QHS  . pantoprazole (PROTONIX) IV  40 mg Intravenous Q12H  . pramipexole  0.25 mg Oral Daily  . sodium bicarbonate  650 mg Oral TID  . tamsulosin  0.4 mg Oral Daily  . traZODone  25 mg Oral QHS  . vitamin B-12  1,000 mcg Oral Daily  . zinc sulfate  220 mg Oral Daily   Continuous Infusions: . cefTRIAXone (ROCEPHIN)  IV Stopped (07/11/20 2130)  . pantoprozole (PROTONIX) infusion 8 mg/hr (07/12/20 0555)  . remdesivir 100 mg in NS 100 mL 100 mg (07/12/20 0934)   PRN Meds:.acetaminophen **OR** [DISCONTINUED] acetaminophen, albuterol, chlorpheniramine-HYDROcodone, guaiFENesin-dextromethorphan, lip balm, loperamide, nitroGLYCERIN, ondansetron (ZOFRAN) IV  Antibiotics  :    Anti-infectives (From admission, onward)   Start     Dose/Rate Route Frequency Ordered Stop   07/11/20 1230  azithromycin (ZITHROMAX) tablet 250 mg     Discontinue     250 mg Oral Daily 07/11/20 1221 07/15/20 0959   07/10/20 1000  remdesivir 100 mg in sodium chloride 0.9 % 100 mL IVPB     Discontinue     100 mg 200 mL/hr over 30 Minutes Intravenous Daily 07/09/20 2111  07/14/20 0959   07/09/20 2200  remdesivir 100 mg in sodium chloride 0.9 % 100 mL IVPB        100 mg 200 mL/hr over 30 Minutes Intravenous Every 1 hr x 2 07/09/20 2111 07/10/20 0153   07/09/20 2030  cefTRIAXone (ROCEPHIN) 2 g in sodium chloride 0.9 % 100 mL IVPB  Discontinue     2 g 200 mL/hr over 30 Minutes Intravenous Every 24 hours 07/09/20 1945 07/14/20 2029   07/09/20 2030  azithromycin (ZITHROMAX) 500 mg in sodium chloride 0.9 % 250 mL IVPB  Status:  Discontinued        500 mg 250 mL/hr over 60 Minutes Intravenous Every 24 hours 07/09/20 1945 07/10/20 1314       Time Spent in minutes  30   Lala Lund M.D on 07/12/2020 at 11:01 AM  To page go to [email or web address] - password [facility]  [facility] -  Office  [phone]   See all Orders from today for further details    Objective:   Vitals:   07/12/20 0808 07/12/20 0840 07/12/20 0850 07/12/20 0923  BP: (!) 132/96  (!) 138/57 (!) 120/51  Pulse: 73  72 69  Resp: 17   18  Temp: 98 F (36.7 C)   (!) 97.3 F (36.3 C)  TempSrc: Oral     SpO2: 90% 93%  91%  Weight:      Height:        Wt Readings from Last 3 Encounters:  07/09/20 61.5 kg  08/24/19 68.8 kg     Intake/Output Summary (Last 24 hours) at 07/12/2020 1101 Last data filed at 07/12/2020 0923 Gross per 24 hour  Intake 600.02 ml  Output 600 ml  Net 0.02 ml     Physical Exam  Awake Alert, No new F.N deficits, Normal affect [place].AT,PERRAL Supple Neck,No JVD, No cervical lymphadenopathy appriciated.  Symmetrical Chest wall movement, Good air movement bilaterally, few rales RRR,No Gallops, Rubs or new Murmurs, No Parasternal Heave +ve B.Sounds, Abd Soft, No tenderness, No organomegaly appriciated, No rebound - guarding or rigidity. No Cyanosis, Clubbing or edema, No new Rash or bruise     Data Review:    CBC Recent Labs  Lab 07/09/20 1726 07/09/20 1726 07/10/20 0559 07/10/20 0559 07/10/20 1708 07/11/20 1336 07/11/20 1619 07/11/20 2209  07/12/20 0426  WBC 7.3  --  5.4  --   --  6.7  --   --  3.7*  HGB 10.3*   < > 9.7*   < > 9.1* 9.6* 9.4* 7.7* 8.7*  HCT 33.4*   < > 30.6*   < > 30.4* 30.1* 30.5* 24.7* 27.8*  PLT 186  --  191  --   --  201  --   --  184  MCV 97.7  --  96.5  --   --  93.5  --   --  95.9  MCH 30.1  --  30.6  --   --  29.8  --   --  30.0  MCHC 30.8  --  31.7  --   --  31.9  --   --  31.3  RDW 14.4  --  14.1  --   --  14.0  --   --  14.1  LYMPHSABS 1.4  --   --   --   --   --   --   --   --   MONOABS 1.3*  --   --   --   --   --   --   --   --   EOSABS 0.0  --   --   --   --   --   --   --   --   BASOSABS 0.0  --   --   --   --   --   --   --   --    < > =  values in this interval not displayed.    Chemistries  Recent Labs  Lab 07/09/20 1550 07/10/20 0559 07/11/20 1336 07/12/20 0426  NA 137 137 136 138  K 4.6 4.1 3.7 3.6  CL 107 111 107 107  CO2 17* 14* 20* 18*  GLUCOSE 73 120* 109* 142*  BUN 35* 42* 56* 72*  CREATININE 1.79* 1.50* 1.67* 2.25*  CALCIUM 8.3* 7.8* 7.8* 7.7*  AST 173* 132* 95* 72*  ALT 67* 53* 43 42  ALKPHOS 185* 149* 140* 116  BILITOT 0.5 0.5 0.5 0.3  MG  --  2.1 2.0 2.1  INR 1.0  --   --   --      ------------------------------------------------------------------------------------------------------------------ Recent Labs    07/09/20 1550  TRIG 124    No results found for: HGBA1C ------------------------------------------------------------------------------------------------------------------ No results for input(s): TSH, T4TOTAL, T3FREE, THYROIDAB in the last 72 hours.  Invalid input(s): FREET3  Cardiac Enzymes No results for input(s): CKMB, TROPONINI, MYOGLOBIN in the last 168 hours.  Invalid input(s): CK ------------------------------------------------------------------------------------------------------------------    Component Value Date/Time   BNP 476.0 (H) 07/12/2020 0426    Micro Results Recent Results (from the past 240 hour(s))  SARS Coronavirus 2  by RT PCR (hospital order, performed in [facility] hospital lab) Nasopharyngeal Nasopharyngeal Swab     Status: Abnormal   Collection Time: 07/09/20 12:24 PM   Specimen: Nasopharyngeal Swab  Result Value Ref Range Status   SARS Coronavirus 2 POSITIVE (A) NEGATIVE Final    Comment: RESULT CALLED TO, READ BACK BY AND VERIFIED WITH: FRANKLIN,C. RN '@1330'  07/09/20 BILLINGSLEY,L (NOTE) SARS-CoV-2 target nucleic acids are DETECTED  SARS-CoV-2 RNA is generally detectable in upper respiratory specimens  during the acute phase of infection.  Positive results are indicative  of the presence of the identified virus, but do not rule out bacterial infection or co-infection with other pathogens not detected by the test.  Clinical correlation with patient history and  other diagnostic information is necessary to determine patient infection status.  The expected result is negative.  Fact Sheet for Patients:   StrictlyIdeas.no   Fact Sheet for Healthcare Providers:   BankingDealers.co.za    This test is not yet approved or cleared by the [place] FDA and  has been authorized for detection and/or diagnosis of SARS-CoV-2 by FDA under an Emergency Use Authorization (EUA).  This EUA will remain in effect (meaning  this test can be used) for the duration of  the COVID-19 declaration under Section 564(b)(1) of the Act, 21 U.S.C. section 360-bbb-3(b)(1), unless the authorization is terminated or revoked sooner.  Performed at [facility], [place] [address], [place], [place] [phone]   Blood Culture (routine x 2)     Status: Abnormal   Collection Time: 07/09/20  3:50 PM   Specimen: BLOOD LEFT HAND  Result Value Ref Range Status   Specimen Description   Final    BLOOD LEFT HAND Performed at [facility] [address], [place], [place] [address]    Special Requests   Final    BOTTLES DRAWN AEROBIC ONLY Blood  Culture results may not be optimal due to an inadequate volume of blood received in culture bottles Performed at [facility] [address], [place], [place] [address]    Culture  Setup Time   Final    AEROBIC BOTTLE ONLY GRAM POSITIVE COCCI IN CLUSTERS CRITICAL RESULT CALLED TO, READ BACK BY AND VERIFIED WITHMelodye Ped [facility] [id number] 07/10/20 A BROWNING  Culture (A)  Final    STAPHYLOCOCCUS SPECIES (COAGULASE NEGATIVE) THE SIGNIFICANCE OF ISOLATING THIS ORGANISM FROM A SINGLE SET OF BLOOD CULTURES WHEN MULTIPLE SETS ARE DRAWN IS UNCERTAIN. PLEASE NOTIFY THE MICROBIOLOGY DEPARTMENT WITHIN ONE WEEK IF SPECIATION AND SENSITIVITIES ARE REQUIRED. Performed at [facility], [place] [address] [place], [place] [address]    Report Status 07/11/2020 FINAL  Final  Blood Culture ID Panel (Reflexed)     Status: Abnormal   Collection Time: 07/09/20  3:50 PM  Result Value Ref Range Status   Enterococcus species NOT DETECTED NOT DETECTED Final   Listeria monocytogenes NOT DETECTED NOT DETECTED Final   Staphylococcus species DETECTED (A) NOT DETECTED Final    Comment: Methicillin (oxacillin) resistant coagulase negative staphylococcus. Possible blood culture contaminant (unless isolated from more than one blood culture draw or clinical case suggests pathogenicity). No antibiotic treatment is indicated for blood  culture contaminants. CRITICAL RESULT CALLED TO, READ BACK BY AND VERIFIED WITH: Melodye Ped PHARMD [id number] 07/10/20 A BROWNING    Staphylococcus aureus (BCID) NOT DETECTED NOT DETECTED Final   Methicillin resistance DETECTED (A) NOT DETECTED Final    Comment: CRITICAL RESULT CALLED TO, READ BACK BY AND VERIFIED WITH: Melodye Ped PHARMD [id number] 07/10/20 A BROWNING    Streptococcus species NOT DETECTED NOT DETECTED Final   Streptococcus agalactiae NOT DETECTED NOT DETECTED Final   Streptococcus pneumoniae NOT DETECTED NOT DETECTED Final   Streptococcus pyogenes NOT DETECTED NOT  DETECTED Final   Acinetobacter baumannii NOT DETECTED NOT DETECTED Final   Enterobacteriaceae species NOT DETECTED NOT DETECTED Final   Enterobacter cloacae complex NOT DETECTED NOT DETECTED Final   Escherichia coli NOT DETECTED NOT DETECTED Final   Klebsiella oxytoca NOT DETECTED NOT DETECTED Final   Klebsiella pneumoniae NOT DETECTED NOT DETECTED Final   Proteus species NOT DETECTED NOT DETECTED Final   Serratia marcescens NOT DETECTED NOT DETECTED Final   Haemophilus influenzae NOT DETECTED NOT DETECTED Final   Neisseria meningitidis NOT DETECTED NOT DETECTED Final   Pseudomonas aeruginosa NOT DETECTED NOT DETECTED Final   Candida albicans NOT DETECTED NOT DETECTED Final   Candida glabrata NOT DETECTED NOT DETECTED Final   Candida krusei NOT DETECTED NOT DETECTED Final   Candida parapsilosis NOT DETECTED NOT DETECTED Final   Candida tropicalis NOT DETECTED NOT DETECTED Final    Comment: Performed at [facility], [place]. [address], [place], [place] [address]  MRSA PCR Screening     Status: None   Collection Time: 07/10/20  8:25 PM   Specimen: Nasal Mucosa; Nasopharyngeal  Result Value Ref Range Status   MRSA by PCR NEGATIVE NEGATIVE Final    Comment:        The GeneXpert MRSA Assay (FDA approved for NASAL specimens only), is one component of a comprehensive MRSA colonization surveillance program. It is not intended to diagnose MRSA infection nor to guide or monitor treatment for MRSA infections. Performed at [facility], [place] [address] [place], [place] [phone]     Radiology Reports DG Chest [place] 1 View  Result Date: 07/11/2020 CLINICAL DATA:  Shortness of breath. EXAM: PORTABLE CHEST 1 VIEW COMPARISON:  07/10/2020. FINDINGS: Cardiomegaly. Bilateral interstitial prominence, right side greater than left noted. Interstitial prominence is increased from prior exam. Findings suggest interstitial edema and/or pneumonitis. Tiny bilateral pleural effusions can not be  excluded. No pneumothorax. IMPRESSION: 1.  Cardiomegaly. 2. Increasing bilateral interstitial prominence, right side greater than left. Findings suggest interstitial edema and or pneumonitis.  Tiny bilateral pleural effusions can not be excluded. Electronically Signed   By: Marcello Moores  Register   On: 07/11/2020 09:37   DG CHEST PORT 1 VIEW  Result Date: 07/10/2020 CLINICAL DATA:  COVID positive EXAM: PORTABLE CHEST 1 VIEW COMPARISON:  07/09/2020 FINDINGS: Small area of increased density at the right lung base. Stable interstitial prominence. No new significant pleural effusion. No pneumothorax. Stable heart size. Probable hiatal hernia. IMPRESSION: New small area of patchy atelectasis/consolidation at the right lung base. Electronically Signed   By: Macy Mis M.D.   On: 07/10/2020 15:19   DG Chest Port 1 View  Result Date: 07/09/2020 CLINICAL DATA:  Cough, shortness of breath. Additional provided: Patient reports generalized body aches, shortness of breath for 3 days, sick contacts EXAM: PORTABLE CHEST 1 VIEW COMPARISON:  Prior chest radiographs 08/22/2019 and earlier FINDINGS: Heart size within normal limits. Aortic atherosclerosis. No appreciable airspace consolidation or pulmonary edema. No evidence of pleural effusion or pneumothorax. No acute bony abnormality identified. Left retrocardiac opacity suspicious for hiatal hernia. IMPRESSION: No evidence of acute cardiopulmonary abnormality. Aortic Atherosclerosis (ICD10-I70.0). Suspected hiatal hernia. Electronically Signed   By: Kellie Simmering DO   On: 07/09/2020 12:03   ECHOCARDIOGRAM COMPLETE  Result Date: 07/10/2020    ECHOCARDIOGRAM REPORT   Patient Name:   CARIS CERVENY Date of Exam: 07/10/2020 Medical Rec #:  [id number]    Height:       59.0 in Accession #:    [id number]   Weight:       135.6 lb Date of Birth:  1925/04/24     BSA:          1.563 m Patient Age:    78 years     BP:           119/60 mmHg Patient Gender: F            HR:           75 bpm.  Exam Location:  Inpatient Procedure: 2D Echo Indications:    acute coronary Syndrome i24.9                  Acute MI 410  History:        Patient has prior history of Echocardiogram examinations, most                 recent 08/22/2019. COPD; Risk Factors:Hypertension, Dyslipidemia                 and Former Smoker. AS. AI.  Sonographer:    Jannett Celestine RDCS (AE) Referring Phys: [id number] [place]  Sonographer Comments: Image acquisition challenging due to respiratory motion. restricted mobility IMPRESSIONS  1. Left ventricular ejection fraction, by estimation, is 50 to 55%. The left ventricle has low normal function. The left ventricle demonstrates regional wall motion abnormalities (see scoring diagram/findings for description). Apical akinesis. There is moderate asymmetric left ventricular hypertrophy of the basal-septal segment. Left ventricular diastolic parameters are consistent with Grade II diastolic dysfunction (pseudonormalization). Elevated left atrial pressure.  2. Right ventricular systolic function is normal. The right ventricular size is normal. Tricuspid regurgitation signal is inadequate for assessing PA pressure.  3. The mitral valve is abnormal. Moderate mitral annular calcification. No evidence of mitral valve regurgitation.  4. The aortic valve was not well visualized. Aortic valve regurgitation is trivial. No aortic stenosis is present. FINDINGS  Left Ventricle: Left ventricular ejection fraction, by estimation, is 50 to 55%. The left ventricle has low normal  function. The left ventricle demonstrates regional wall motion abnormalities. The left ventricular internal cavity size was small. There is moderate asymmetric left ventricular hypertrophy of the basal-septal segment. Left ventricular diastolic parameters are consistent with Grade II diastolic dysfunction (pseudonormalization). Elevated left atrial pressure.  LV Wall Scoring: The apical septal segment, apical anterior segment, and  apex are akinetic. The anterior wall, entire lateral wall, anterior septum, entire inferior wall, mid inferoseptal segment, and basal inferoseptal segment are normal. Right Ventricle: The right ventricular size is normal. Right vetricular wall thickness was not assessed. Right ventricular systolic function is normal. Tricuspid regurgitation signal is inadequate for assessing PA pressure. Left Atrium: Left atrial size was normal in size. Right Atrium: Right atrial size was not well visualized. Pericardium: Trivial pericardial effusion is present. Mitral Valve: The mitral valve is abnormal. Moderate mitral annular calcification. No evidence of mitral valve regurgitation. Tricuspid Valve: The tricuspid valve is normal in structure. Tricuspid valve regurgitation is trivial. Aortic Valve: The aortic valve was not well visualized. Aortic valve regurgitation is trivial. No aortic stenosis is present. Aortic valve mean gradient measures 5.0 mmHg. Aortic valve peak gradient measures 10.0 mmHg. Aortic valve area, by VTI measures 0.89 cm. Pulmonic Valve: The pulmonic valve was not well visualized. Pulmonic valve regurgitation is not visualized. Aorta: The aortic root is normal in size and structure. IAS/Shunts: The interatrial septum was not well visualized.  LEFT VENTRICLE PLAX 2D LVIDd:         3.30 cm  Diastology LVIDs:         2.20 cm  LV e' lateral:   4.79 cm/s LV PW:         1.10 cm  LV E/e' lateral: 14.8 LV IVS:        1.60 cm  LV e' medial:    3.15 cm/s LVOT diam:     1.80 cm  LV E/e' medial:  22.6 LV SV:         31 LV SV Index:   20 LVOT Area:     2.54 cm  RIGHT VENTRICLE RV S prime:     10.80 cm/s TAPSE (M-mode): 1.7 cm LEFT ATRIUM             Index LA diam:        3.10 cm 1.98 cm/m LA Vol (A2C):   35.9 ml 22.96 ml/m LA Vol (A4C):   30.4 ml 19.44 ml/m LA Biplane Vol: 32.3 ml 20.66 ml/m  AORTIC VALVE AV Area (Vmax):    1.30 cm AV Area (Vmean):   1.07 cm AV Area (VTI):     0.89 cm AV Vmax:           158.00  cm/s AV Vmean:          112.000 cm/s AV VTI:            0.345 m AV Peak Grad:      10.0 mmHg AV Mean Grad:      5.0 mmHg LVOT Vmax:         80.80 cm/s LVOT Vmean:        47.300 cm/s LVOT VTI:          0.121 m LVOT/AV VTI ratio: 0.35  AORTA Ao Root diam: 2.70 cm MITRAL VALVE MV Area (PHT): 3.60 cm     SHUNTS MV Decel Time: 211 msec     Systemic VTI:  0.12 m MV E velocity: 71.10 cm/s   Systemic Diam: 1.80 cm MV A velocity: 120.00 cm/s  MV E/A ratio:  0.59 Oswaldo Milian MD Electronically signed by Oswaldo Milian MD Signature Date/Time: 07/10/2020/8:45:41 PM    Final

## 2020-07-13 ENCOUNTER — Inpatient Hospital Stay (HOSPITAL_COMMUNITY): Payer: Medicare Other

## 2020-07-13 LAB — TYPE AND SCREEN
ABO/RH(D): A POS
Antibody Screen: NEGATIVE
Unit division: 0

## 2020-07-13 LAB — BPAM RBC
Blood Product Expiration Date: 202108202359
ISSUE DATE / TIME: 202107240614
Unit Type and Rh: 6200

## 2020-07-13 LAB — COMPREHENSIVE METABOLIC PANEL
ALT: 31 U/L (ref 0–44)
AST: 47 U/L — ABNORMAL HIGH (ref 15–41)
Albumin: 2.2 g/dL — ABNORMAL LOW (ref 3.5–5.0)
Alkaline Phosphatase: 103 U/L (ref 38–126)
Anion gap: 12 (ref 5–15)
BUN: 70 mg/dL — ABNORMAL HIGH (ref 8–23)
CO2: 20 mmol/L — ABNORMAL LOW (ref 22–32)
Calcium: 7.4 mg/dL — ABNORMAL LOW (ref 8.9–10.3)
Chloride: 105 mmol/L (ref 98–111)
Creatinine, Ser: 2.2 mg/dL — ABNORMAL HIGH (ref 0.44–1.00)
GFR calc Af Amer: 22 mL/min — ABNORMAL LOW (ref >60)
GFR calc non Af Amer: 19 mL/min — ABNORMAL LOW (ref >60)
Glucose, Bld: 119 mg/dL — ABNORMAL HIGH (ref 70–99)
Potassium: 3.5 mmol/L (ref 3.5–5.1)
Sodium: 137 mmol/L (ref 135–145)
Total Bilirubin: 0.5 mg/dL (ref 0.3–1.2)
Total Protein: 5.4 g/dL — ABNORMAL LOW (ref 6.5–8.1)

## 2020-07-13 LAB — CBC
HCT: 30.2 % — ABNORMAL LOW (ref 36.0–46.0)
Hemoglobin: 9.7 g/dL — ABNORMAL LOW (ref 12.0–15.0)
MCH: 29.5 pg (ref 26.0–34.0)
MCHC: 32.1 g/dL (ref 30.0–36.0)
MCV: 91.8 fL (ref 80.0–100.0)
Platelets: 151 10*3/uL (ref 150–400)
RBC: 3.29 MIL/uL — ABNORMAL LOW (ref 3.87–5.11)
RDW: 14.6 % (ref 11.5–15.5)
WBC: 3.7 10*3/uL — ABNORMAL LOW (ref 4.0–10.5)
nRBC: 0 % (ref 0.0–0.2)

## 2020-07-13 LAB — PROCALCITONIN: Procalcitonin: 1.66 ng/mL

## 2020-07-13 LAB — D-DIMER, QUANTITATIVE: D-Dimer, Quant: 1.13 ug/mL-FEU — ABNORMAL HIGH (ref 0.00–0.50)

## 2020-07-13 LAB — BRAIN NATRIURETIC PEPTIDE: B Natriuretic Peptide: 331.9 pg/mL — ABNORMAL HIGH (ref 0.0–100.0)

## 2020-07-13 LAB — MAGNESIUM: Magnesium: 1.8 mg/dL (ref 1.7–2.4)

## 2020-07-13 LAB — C-REACTIVE PROTEIN: CRP: 1 mg/dL — ABNORMAL HIGH (ref <1.0)

## 2020-07-13 MED ORDER — POTASSIUM CHLORIDE CRYS ER 20 MEQ PO TBCR
40.0000 meq | EXTENDED_RELEASE_TABLET | Freq: Once | ORAL | Status: AC
  Administered 2020-07-13: 40 meq via ORAL
  Filled 2020-07-13: qty 2

## 2020-07-13 MED ORDER — TRAMADOL HCL 50 MG PO TABS
50.0000 mg | ORAL_TABLET | Freq: Four times a day (QID) | ORAL | Status: DC | PRN
  Administered 2020-07-13 – 2020-07-15 (×6): 50 mg via ORAL
  Filled 2020-07-13 (×7): qty 1

## 2020-07-13 MED ORDER — FUROSEMIDE 40 MG PO TABS
40.0000 mg | ORAL_TABLET | Freq: Once | ORAL | Status: AC
  Administered 2020-07-13: 40 mg via ORAL
  Filled 2020-07-13: qty 1

## 2020-07-13 MED ORDER — HYDRALAZINE HCL 25 MG PO TABS
25.0000 mg | ORAL_TABLET | Freq: Three times a day (TID) | ORAL | Status: DC
  Administered 2020-07-13 – 2020-07-15 (×3): 25 mg via ORAL
  Filled 2020-07-13 (×5): qty 1

## 2020-07-13 NOTE — Progress Notes (Signed)
PROGRESS NOTE                                                                                                                                                                                                             Patient Demographics:    Lisa Cortez, is a 86 y.o. female, DOB - September 03, 1925, ZDG:[id number]  Outpatient Primary MD for the patient is Lajean Manes, MD    LOS - 4  Admit date - 07/09/2020    Chief Complaint  Patient presents with  . Generalized Body Aches  . Shortness of Breath       Brief Narrative - Lisa Cortez is a 86 y.o. female with a history of CAD s/p PCI x3, HTN, HLD, stage IV CKD, hypothyroidism, mild memory loss and covid-19 vaccination in Jan-Feb 2021 who presented to [facility]21 from ALF with progressive dyspnea, cough, and weakness found to be hypoxemic requiring 2L O2.  Work-up was suggestive of NSTEMI, CHF, COVID-19 infection, AKI along with elevated procalcitonin raising suspicion of bacterial infection.   Subjective:   Patient in bed, appears comfortable, denies any headache, no fever, no chest pain or pressure, improved shortness of breath , no abdominal pain. No focal weakness.  Still having dark stools.   Assessment  & Plan :     1. Acute Hypoxic Resp. Failure due to NSTEMI causing acute on chronic diastolic CHF with EF 95% due to NSTEMI along with likely insignificant acute Covid 19 Viral infection -   Patient has crackles, elevated troponin and extremely elevated BNP along with orthopnea.  She is fully vaccinated for COVID-19 and I think her COVID-19 infection is most likely asymptomatic and incidental.  Her main issue with NSTEMI placing her in acute on chronic diastolic CHF.  He was started on IV Lasix, statin, Imdur and Coreg with much improvement in shortness of breath.  2 ongoing melanotic stools aspirin and heparin drip will be held.  Supplemental oxygen, nebulizer treatments will be  continued.  Plan discussed with family in detail of declines full comfort care.  She is DNR.  As far as COVID-19 infection is concerned she has been started on steroids and remdesivir and will finish the course.  Encouraged the patient to sit up in chair in the daytime use I-S and flutter valve for pulmonary toiletry and then prone in bed  when at night.  Will advance activity and titrate down oxygen as possible.    SpO2: 90 % O2 Flow Rate (L/min): 2 L/min  Recent Labs  Lab 07/09/20 1224 07/09/20 1550 07/09/20 1620 07/09/20 1726 07/09/20 1930 07/10/20 0559 07/10/20 0559 07/10/20 0600 07/11/20 1336 07/12/20 0426 07/12/20 1852 07/12/20 2325 07/13/20 0331  WBC  --   --   --    < >  --  5.4   < >  --  6.7 3.7* 4.0 4.1 3.7*  PLT  --   --   --    < >  --  191   < >  --  201 184 171 164 151  CRP  --  4.2*  --   --   --   --   --  4.2* 2.1* 2.5*  --   --  1.0*  DDIMER  --  2.61*  --   --   --  1.67*  --   --  0.69* 0.68*  --   --  1.13*  PROCALCITON  --  6.33  --   --   --   --   --   --  3.62 3.38  --   --  1.66  LATICACIDVEN  --   --  1.5  --  1.1  --   --   --   --   --   --   --   --   SARSCOV2NAA POSITIVE*  --   --   --   --   --   --   --   --   --   --   --   --    < > = values in this interval not displayed.    Hepatic Function Latest Ref Rng & Units 07/13/2020 07/12/2020 07/11/2020  Total Protein 6.5 - 8.1 g/dL 5.4(L) 5.7(L) 6.1(L)  Albumin 3.5 - 5.0 g/dL 2.2(L) 2.3(L) 2.4(L)  AST 15 - 41 U/L 47(H) 72(H) 95(H)  ALT 0 - 44 U/L 31 42 43  Alk Phosphatase 38 - 126 U/L 103 116 140(H)  Total Bilirubin 0.3 - 1.2 mg/dL 0.5 0.3 0.5    2.  NSTEMI with acute on chronic diastolic CHF EF 43%.  See #1 above.  3.  Ongoing upper GI bleed causing dark stools with falling H&H.  Heparin drip and aspirin were stopped on 07/11/2020, she is s/p 1 unit packed RBC transfusion on 07/12/2020, IV PPI drip, clear liquids, pending tagged RBC scan to localize bleeding.  Not a ideal candidate for EGD at  this point due to her advanced age and comorbidities, if bleeding continues then we will involve GI as a last resort.  Discussed the plan with granddaughter who is a nephrologist.  Overall H&H seems to have stabilized with conservative management we will continue to monitor.  4.  AKI on CKD 3.  Baseline creatinine around 1.4.  Worse due to hypotension acute blood loss, transfuse and monitor.  5.  Urinary retention.  Foley, Flomax.  6.  Hypothyroidism.  On Synthroid.  7.  History of COPD.  No acute issues.  No wheezing.  Supportive care.  8.  Possible overlapping bacterial atypical lung infection.  Total 5 days of Rocephin and azithromycin.  9.  Elevated LFTs due to hepatic congestion from CHF.  Improving.    Condition - Extremely Guarded  Family Communication  :  Daughter 07/11/20, grand daughter  Amret [phone] on 07/11/20, 07/12/20,  daughter on the floor on  07/13/2020 in person.  Code Status : DNR, gentle medical treatment if declines full comfort care.  Consults  : Cards  Procedures  :   Tagged RBC scan -  TTE - 1. Left ventricular ejection fraction, by estimation, is 50 to 55%. The left ventricle has low normal function. The left ventricle demonstrates regional wall motion abnormalities (see scoring diagram/findings for description). Apical akinesis. There is moderate asymmetric left ventricular hypertrophy of the basal-septal segment. Left ventricular diastolic parameters are consistent with Grade II diastolic dysfunction (pseudonormalization). Elevated left atrial pressure.  2. Right ventricular systolic function is normal. The right ventricular size is normal. Tricuspid regurgitation signal is inadequate for assessing PA pressure.  3. The mitral valve is abnormal. Moderate mitral annular calcification. No evidence of mitral valve regurgitation.  4. The aortic valve was not well visualized. Aortic valve regurgitation is trivial. No aortic stenosis is present.    PUD Prophylaxis  : IV PPI  Disposition Plan  :    Status is: Inpatient  Remains inpatient appropriate because:IV treatments appropriate due to intensity of illness or inability to take PO   Dispo: The patient is from: SNF              Anticipated d/c is to: SNF              Anticipated d/c date is: > 3 days              Patient currently is not medically stable to d/c.   DVT Prophylaxis  :   SCDs    Lab Results  Component Value Date   PLT 151 07/13/2020    Diet :  Diet Order            Diet clear liquid Room service appropriate? Yes; Fluid consistency: Thin  Diet effective now                  Inpatient Medications  Scheduled Meds: . vitamin C  500 mg Oral Daily  . atorvastatin  10 mg Oral Daily  . azithromycin  250 mg Oral Daily  . carvedilol  3.125 mg Oral BID WC  . Chlorhexidine Gluconate Cloth  6 each Topical Daily  . furosemide  40 mg Oral Once  . Gerhardt's butt cream   Topical TID  . hydrALAZINE  25 mg Oral Q8H  . isosorbide mononitrate  30 mg Oral Daily  . levothyroxine  75 mcg Oral Daily  . memantine  5 mg Oral Daily  . methylPREDNISolone (SOLU-MEDROL) injection  20 mg Intravenous Daily  . mometasone-formoterol  2 puff Inhalation BID  . montelukast  10 mg Oral QHS  . pantoprazole (PROTONIX) IV  40 mg Intravenous Q12H  . pramipexole  0.25 mg Oral Daily  . sodium bicarbonate  650 mg Oral TID  . tamsulosin  0.4 mg Oral Daily  . traZODone  25 mg Oral QHS  . vitamin B-12  1,000 mcg Oral Daily  . zinc sulfate  220 mg Oral Daily   Continuous Infusions: . cefTRIAXone (ROCEPHIN)  IV 2 g (07/12/20 2005)  . pantoprozole (PROTONIX) infusion 8 mg/hr (07/13/20 0912)   PRN Meds:.acetaminophen **OR** [DISCONTINUED] acetaminophen, albuterol, chlorpheniramine-HYDROcodone, guaiFENesin-dextromethorphan, Ipratropium-Albuterol, lip balm, loperamide, nitroGLYCERIN, ondansetron (ZOFRAN) IV, technetium labeled red blood cells, traMADol  Antibiotics  :    Anti-infectives (From  admission, onward)   Start     Dose/Rate Route Frequency Ordered Stop   07/11/20 1230  azithromycin (ZITHROMAX) tablet 250 mg     Discontinue  250 mg Oral Daily 07/11/20 1221 07/15/20 0959   07/10/20 1000  remdesivir 100 mg in sodium chloride 0.9 % 100 mL IVPB        100 mg 200 mL/hr over 30 Minutes Intravenous Daily 07/09/20 2111 07/13/20 0937   07/09/20 2200  remdesivir 100 mg in sodium chloride 0.9 % 100 mL IVPB        100 mg 200 mL/hr over 30 Minutes Intravenous Every 1 hr x 2 07/09/20 2111 07/10/20 0153   07/09/20 2030  cefTRIAXone (ROCEPHIN) 2 g in sodium chloride 0.9 % 100 mL IVPB     Discontinue     2 g 200 mL/hr over 30 Minutes Intravenous Every 24 hours 07/09/20 1945 07/14/20 2029   07/09/20 2030  azithromycin (ZITHROMAX) 500 mg in sodium chloride 0.9 % 250 mL IVPB  Status:  Discontinued        500 mg 250 mL/hr over 60 Minutes Intravenous Every 24 hours 07/09/20 1945 07/10/20 1314       Time Spent in minutes  30   Lala Lund M.D on 07/13/2020 at 11:22 AM  To page go to [email or web address] - password [email or web address]  [facility] -  Office  [phone]   See all Orders from today for further details    Objective:   Vitals:   07/12/20 2000 07/13/20 0000 07/13/20 0400 07/13/20 0749  BP: (!) 131/53 (!) 125/59 (!) 140/72 (!) 152/67  Pulse: 66 63 68 65  Resp: _0 Temp: 98.1 F (36.7 C) 98.5 F (36.9 C) 97.9 F (36.6 C) (!) 97.3 F (36.3 C)  TempSrc: Oral Axillary  Oral  SpO2: 93% 95% 91% 90%  Weight:      Height:        Wt Readings from Last 3 Encounters:  07/09/20 61.5 kg  08/24/19 68.8 kg     Intake/Output Summary (Last 24 hours) at 07/13/2020 1122 Last data filed at 07/12/2020 2005 Gross per 24 hour  Intake 214.53 ml  Output 950 ml  Net -735.47 ml     Physical Exam  Awake Alert, No new F.N deficits, Normal affect [place].AT,PERRAL Supple Neck,No JVD, No cervical lymphadenopathy appriciated.  Symmetrical Chest wall movement, Good air movement  bilaterally, few rales RRR,No Gallops, Rubs or new Murmurs, No Parasternal Heave +ve B.Sounds, Abd Soft, No tenderness, No organomegaly appriciated, No rebound - guarding or rigidity. No Cyanosis, Clubbing or edema, No new Rash or bruise    Data Review:    CBC Recent Labs  Lab 07/09/20 1726 07/10/20 0559 07/11/20 1336 07/11/20 1619 07/11/20 2209 07/12/20 0426 07/12/20 1852 07/12/20 2325 07/13/20 0331  WBC 7.3   < > 6.7  --   --  3.7* 4.0 4.1 3.7*  HGB 10.3*   < > 9.6*   < > 7.7* 8.7* 10.3* 9.8* 9.7*  HCT 33.4*   < > 30.1*   < > 24.7* 27.8* 31.7* 29.9* 30.2*  PLT 186   < > 201  --   --  184 171 164 151  MCV 97.7   < > 93.5  --   --  95.9 91.6 90.6 91.8  MCH 30.1   < > 29.8  --   --  30.0 29.8 29.7 29.5  MCHC 30.8   < > 31.9  --   --  31.3 32.5 32.8 32.1  RDW 14.4   < > 14.0  --   --  14.1 14.6 14.5 14.6  LYMPHSABS 1.4  --   --   --   --   --   --   --   --  MONOABS 1.3*  --   --   --   --   --   --   --   --   EOSABS 0.0  --   --   --   --   --   --   --   --   BASOSABS 0.0  --   --   --   --   --   --   --   --    < > = values in this interval not displayed.    Chemistries  Recent Labs  Lab 07/09/20 1550 07/10/20 0559 07/11/20 1336 07/12/20 0426 07/13/20 0331  NA 137 137 136 138 137  K 4.6 4.1 3.7 3.6 3.5  CL 107 111 107 107 105  CO2 17* 14* 20* 18* 20*  GLUCOSE 73 120* 109* 142* 119*  BUN 35* 42* 56* 72* 70*  CREATININE 1.79* 1.50* 1.67* 2.25* 2.20*  CALCIUM 8.3* 7.8* 7.8* 7.7* 7.4*  AST 173* 132* 95* 72* 47*  ALT 67* 53* 43 42 31  ALKPHOS 185* 149* 140* 116 103  BILITOT 0.5 0.5 0.5 0.3 0.5  MG  --  2.1 2.0 2.1 1.8  INR 1.0  --   --   --   --      ------------------------------------------------------------------------------------------------------------------ No results for input(s): CHOL, HDL, LDLCALC, TRIG, CHOLHDL, LDLDIRECT in the last 72 hours.  No results found for:  HGBA1C ------------------------------------------------------------------------------------------------------------------ No results for input(s): TSH, T4TOTAL, T3FREE, THYROIDAB in the last 72 hours.  Invalid input(s): FREET3  Cardiac Enzymes No results for input(s): CKMB, TROPONINI, MYOGLOBIN in the last 168 hours.  Invalid input(s): CK ------------------------------------------------------------------------------------------------------------------    Component Value Date/Time   BNP 331.9 (H) 07/13/2020 0331    Micro Results Recent Results (from the past 240 hour(s))  SARS Coronavirus 2 by RT PCR (hospital order, performed in [place] hospital lab) Nasopharyngeal Nasopharyngeal Swab     Status: Abnormal   Collection Time: 07/09/20 12:24 PM   Specimen: Nasopharyngeal Swab  Result Value Ref Range Status   SARS Coronavirus 2 POSITIVE (A) NEGATIVE Final    Comment: RESULT CALLED TO, READ BACK BY AND VERIFIED WITH: FRANKLIN,C. RN _0  07/09/20 BILLINGSLEY,L (NOTE) SARS-CoV-2 target nucleic acids are DETECTED  SARS-CoV-2 RNA is generally detectable in upper respiratory specimens  during the acute phase of infection.  Positive results are indicative  of the presence of the identified virus, but do not rule out bacterial infection or co-infection with other pathogens not detected by the test.  Clinical correlation with patient history and  other diagnostic information is necessary to determine patient infection status.  The expected result is negative.  Fact Sheet for Patients:   StrictlyIdeas.no   Fact Sheet for Healthcare Providers:   BankingDealers.co.za    This test is not yet approved or cleared by the [place] FDA and  has been authorized for detection and/or diagnosis of SARS-CoV-2 by FDA under an Emergency Use Authorization (EUA).  This EUA will remain in effect (meaning  this test can be used) for the duration of   the COVID-19 declaration under Section 564(b)(1) of the Act, 21 U.S.C. section 360-bbb-3(b)(1), unless the authorization is terminated or revoked sooner.  Performed at [facility], [place] [address] [place], [place] [address]   Blood Culture (routine x 2)     Status: Abnormal   Collection Time: 07/09/20  3:50 PM   Specimen: BLOOD LEFT HAND  Result Value Ref Range Status   Specimen Description  Final    BLOOD LEFT HAND Performed at [facility], [place] [address] [place], [place] [address]    Special Requests   Final    BOTTLES DRAWN AEROBIC ONLY Blood Culture results may not be optimal due to an inadequate volume of blood received in culture bottles Performed at [place] [address], [place], [place] [address]    Culture  Setup Time   Final    AEROBIC BOTTLE ONLY GRAM POSITIVE COCCI IN CLUSTERS CRITICAL RESULT CALLED TO, READ BACK BY AND VERIFIED WITH: Melodye Ped PHARMD [id number] 07/10/20 A BROWNING    Culture (A)  Final    STAPHYLOCOCCUS SPECIES (COAGULASE NEGATIVE) THE SIGNIFICANCE OF ISOLATING THIS ORGANISM FROM A SINGLE SET OF BLOOD CULTURES WHEN MULTIPLE SETS ARE DRAWN IS UNCERTAIN. PLEASE NOTIFY THE MICROBIOLOGY DEPARTMENT WITHIN ONE WEEK IF SPECIATION AND SENSITIVITIES ARE REQUIRED. Performed at [facility], [place] [address], [place], [place] [address]    Report Status 07/11/2020 FINAL  Final  Blood Culture ID Panel (Reflexed)     Status: Abnormal   Collection Time: 07/09/20  3:50 PM  Result Value Ref Range Status   Enterococcus species NOT DETECTED NOT DETECTED Final   Listeria monocytogenes NOT DETECTED NOT DETECTED Final   Staphylococcus species DETECTED (A) NOT DETECTED Final    Comment: Methicillin (oxacillin) resistant coagulase negative staphylococcus. Possible blood culture contaminant (unless isolated from more than one blood culture draw or clinical case suggests pathogenicity). No antibiotic treatment  is indicated for blood  culture contaminants. CRITICAL RESULT CALLED TO, READ BACK BY AND VERIFIED WITH: Melodye Ped PHARMD [id number] 07/10/20 A BROWNING    Staphylococcus aureus (BCID) NOT DETECTED NOT DETECTED Final   Methicillin resistance DETECTED (A) NOT DETECTED Final    Comment: CRITICAL RESULT CALLED TO, READ BACK BY AND VERIFIED WITH: Melodye Ped PHARMD [id number] 07/10/20 A BROWNING    Streptococcus species NOT DETECTED NOT DETECTED Final   Streptococcus agalactiae NOT DETECTED NOT DETECTED Final   Streptococcus pneumoniae NOT DETECTED NOT DETECTED Final   Streptococcus pyogenes NOT DETECTED NOT DETECTED Final   Acinetobacter baumannii NOT DETECTED NOT DETECTED Final   Enterobacteriaceae species NOT DETECTED NOT DETECTED Final   Enterobacter cloacae complex NOT DETECTED NOT DETECTED Final   Escherichia coli NOT DETECTED NOT DETECTED Final   Klebsiella oxytoca NOT DETECTED NOT DETECTED Final   Klebsiella pneumoniae NOT DETECTED NOT DETECTED Final   Proteus species NOT DETECTED NOT DETECTED Final   Serratia marcescens NOT DETECTED NOT DETECTED Final   Haemophilus influenzae NOT DETECTED NOT DETECTED Final   Neisseria meningitidis NOT DETECTED NOT DETECTED Final   Pseudomonas aeruginosa NOT DETECTED NOT DETECTED Final   Candida albicans NOT DETECTED NOT DETECTED Final   Candida glabrata NOT DETECTED NOT DETECTED Final   Candida krusei NOT DETECTED NOT DETECTED Final   Candida parapsilosis NOT DETECTED NOT DETECTED Final   Candida tropicalis NOT DETECTED NOT DETECTED Final    Comment: Performed at [facility], [place]. [address], [place], [place] [address]  MRSA PCR Screening     Status: None   Collection Time: 07/10/20  8:25 PM   Specimen: Nasal Mucosa; Nasopharyngeal  Result Value Ref Range Status   MRSA by PCR NEGATIVE NEGATIVE Final    Comment:        The GeneXpert MRSA Assay (FDA approved for NASAL specimens only), is one component of a comprehensive MRSA colonization surveillance  program. It is not intended to diagnose MRSA  infection nor to guide or monitor treatment for MRSA infections. Performed at [facility], [place] [address], [place], [place] [address]     Radiology Reports DG Chest [place] 1 View  Result Date: 07/13/2020 CLINICAL DATA:  Shob/ covid + Evaluate Increasing bilateral interstitial prominence, interstitial edema and or pneumonitis. Tiny bilateral pleural effusions EXAM: PORTABLE CHEST - 1 VIEW COMPARISON:  07/11/2020 FINDINGS: Asymmetric interstitial and peripheral airspace opacities, right greater than left, involving bases more than upper lobes. Slight worsening of airspace opacities at the right lung base partially obscuring the right diaphragmatic leaflet. Blunting of bilateral costophrenic angles suggesting small effusions. Heart size upper limits normal for technique. Aortic Atherosclerosis (ICD10-170.0). No pneumothorax. Visualized bones unremarkable. IMPRESSION: 1. Worsening bilateral airspace disease, right greater than left. 2. Probable small pleural effusions. Electronically Signed   By: Lucrezia Europe M.D.   On: 07/13/2020 09:19   DG Chest Port 1 View  Result Date: 07/11/2020 CLINICAL DATA:  Shortness of breath. EXAM: PORTABLE CHEST 1 VIEW COMPARISON:  07/10/2020. FINDINGS: Cardiomegaly. Bilateral interstitial prominence, right side greater than left noted. Interstitial prominence is increased from prior exam. Findings suggest interstitial edema and/or pneumonitis. Tiny bilateral pleural effusions can not be excluded. No pneumothorax. IMPRESSION: 1.  Cardiomegaly. 2. Increasing bilateral interstitial prominence, right side greater than left. Findings suggest interstitial edema and or pneumonitis. Tiny bilateral pleural effusions can not be excluded. Electronically Signed   By: Marcello Moores  Register   On: 07/11/2020 09:37   DG CHEST PORT 1 VIEW  Result Date: 07/10/2020 CLINICAL DATA:  COVID positive EXAM: PORTABLE CHEST 1 VIEW COMPARISON:  07/09/2020  FINDINGS: Small area of increased density at the right lung base. Stable interstitial prominence. No new significant pleural effusion. No pneumothorax. Stable heart size. Probable hiatal hernia. IMPRESSION: New small area of patchy atelectasis/consolidation at the right lung base. Electronically Signed   By: Macy Mis M.D.   On: 07/10/2020 15:19   DG Chest Port 1 View  Result Date: 07/09/2020 CLINICAL DATA:  Cough, shortness of breath. Additional provided: Patient reports generalized body aches, shortness of breath for 3 days, sick contacts EXAM: PORTABLE CHEST 1 VIEW COMPARISON:  Prior chest radiographs 08/22/2019 and earlier FINDINGS: Heart size within normal limits. Aortic atherosclerosis. No appreciable airspace consolidation or pulmonary edema. No evidence of pleural effusion or pneumothorax. No acute bony abnormality identified. Left retrocardiac opacity suspicious for hiatal hernia. IMPRESSION: No evidence of acute cardiopulmonary abnormality. Aortic Atherosclerosis (ICD10-I70.0). Suspected hiatal hernia. Electronically Signed   By: Kellie Simmering DO   On: 07/09/2020 12:03   ECHOCARDIOGRAM COMPLETE  Result Date: 07/10/2020    ECHOCARDIOGRAM REPORT   Patient Name:   DELISSA SILBA Date of Exam: 07/10/2020 Medical Rec #:  [id number]    Height:       59.0 in Accession #:    [id number]   Weight:       135.6 lb Date of Birth:  09/18/1925     BSA:          1.563 m Patient Age:    86 years     BP:           119/60 mmHg Patient Gender: F            HR:           75 bpm. Exam Location:  Inpatient Procedure: 2D Echo Indications:    acute coronary Syndrome i24.9  Acute MI 410  History:        Patient has prior history of Echocardiogram examinations, most                 recent 08/22/2019. COPD; Risk Factors:Hypertension, Dyslipidemia                 and Former Smoker. AS. AI.  Sonographer:    Jannett Celestine RDCS (AE) Referring Phys: [id number] Dustin  Sonographer Comments: Image acquisition  challenging due to respiratory motion. restricted mobility IMPRESSIONS  1. Left ventricular ejection fraction, by estimation, is 50 to 55%. The left ventricle has low normal function. The left ventricle demonstrates regional wall motion abnormalities (see scoring diagram/findings for description). Apical akinesis. There is moderate asymmetric left ventricular hypertrophy of the basal-septal segment. Left ventricular diastolic parameters are consistent with Grade II diastolic dysfunction (pseudonormalization). Elevated left atrial pressure.  2. Right ventricular systolic function is normal. The right ventricular size is normal. Tricuspid regurgitation signal is inadequate for assessing PA pressure.  3. The mitral valve is abnormal. Moderate mitral annular calcification. No evidence of mitral valve regurgitation.  4. The aortic valve was not well visualized. Aortic valve regurgitation is trivial. No aortic stenosis is present. FINDINGS  Left Ventricle: Left ventricular ejection fraction, by estimation, is 50 to 55%. The left ventricle has low normal function. The left ventricle demonstrates regional wall motion abnormalities. The left ventricular internal cavity size was small. There is moderate asymmetric left ventricular hypertrophy of the basal-septal segment. Left ventricular diastolic parameters are consistent with Grade II diastolic dysfunction (pseudonormalization). Elevated left atrial pressure.  LV Wall Scoring: The apical septal segment, apical anterior segment, and apex are akinetic. The anterior wall, entire lateral wall, anterior septum, entire inferior wall, mid inferoseptal segment, and basal inferoseptal segment are normal. Right Ventricle: The right ventricular size is normal. Right vetricular wall thickness was not assessed. Right ventricular systolic function is normal. Tricuspid regurgitation signal is inadequate for assessing PA pressure. Left Atrium: Left atrial size was normal in size. Right  Atrium: Right atrial size was not well visualized. Pericardium: Trivial pericardial effusion is present. Mitral Valve: The mitral valve is abnormal. Moderate mitral annular calcification. No evidence of mitral valve regurgitation. Tricuspid Valve: The tricuspid valve is normal in structure. Tricuspid valve regurgitation is trivial. Aortic Valve: The aortic valve was not well visualized. Aortic valve regurgitation is trivial. No aortic stenosis is present. Aortic valve mean gradient measures 5.0 mmHg. Aortic valve peak gradient measures 10.0 mmHg. Aortic valve area, by VTI measures 0.89 cm. Pulmonic Valve: The pulmonic valve was not well visualized. Pulmonic valve regurgitation is not visualized. Aorta: The aortic root is normal in size and structure. IAS/Shunts: The interatrial septum was not well visualized.  LEFT VENTRICLE PLAX 2D LVIDd:         3.30 cm  Diastology LVIDs:         2.20 cm  LV e' lateral:   4.79 cm/s LV PW:         1.10 cm  LV E/e' lateral: 14.8 LV IVS:        1.60 cm  LV e' medial:    3.15 cm/s LVOT diam:     1.80 cm  LV E/e' medial:  22.6 LV SV:         31 LV SV Index:   20 LVOT Area:     2.54 cm  RIGHT VENTRICLE RV S prime:     10.80 cm/s TAPSE (M-mode): 1.7 cm  LEFT ATRIUM             Index LA diam:        3.10 cm 1.98 cm/m LA Vol (A2C):   35.9 ml 22.96 ml/m LA Vol (A4C):   30.4 ml 19.44 ml/m LA Biplane Vol: 32.3 ml 20.66 ml/m  AORTIC VALVE AV Area (Vmax):    1.30 cm AV Area (Vmean):   1.07 cm AV Area (VTI):     0.89 cm AV Vmax:           158.00 cm/s AV Vmean:          112.000 cm/s AV VTI:            0.345 m AV Peak Grad:      10.0 mmHg AV Mean Grad:      5.0 mmHg LVOT Vmax:         80.80 cm/s LVOT Vmean:        47.300 cm/s LVOT VTI:          0.121 m LVOT/AV VTI ratio: 0.35  AORTA Ao Root diam: 2.70 cm MITRAL VALVE MV Area (PHT): 3.60 cm     SHUNTS MV Decel Time: 211 msec     Systemic VTI:  0.12 m MV E velocity: 71.10 cm/s   Systemic Diam: 1.80 cm MV A velocity: 120.00 cm/s MV E/A  ratio:  0.59 Oswaldo Milian MD Electronically signed by Oswaldo Milian MD Signature Date/Time: 07/10/2020/8:45:41 PM    Final

## 2020-07-14 LAB — CBC
HCT: 32.1 % — ABNORMAL LOW (ref 36.0–46.0)
Hemoglobin: 10.4 g/dL — ABNORMAL LOW (ref 12.0–15.0)
MCH: 29.7 pg (ref 26.0–34.0)
MCHC: 32.4 g/dL (ref 30.0–36.0)
MCV: 91.7 fL (ref 80.0–100.0)
Platelets: 172 10*3/uL (ref 150–400)
RBC: 3.5 MIL/uL — ABNORMAL LOW (ref 3.87–5.11)
RDW: 14.5 % (ref 11.5–15.5)
WBC: 5.3 10*3/uL (ref 4.0–10.5)
nRBC: 0 % (ref 0.0–0.2)

## 2020-07-14 LAB — MAGNESIUM: Magnesium: 1.9 mg/dL (ref 1.7–2.4)

## 2020-07-14 LAB — COMPREHENSIVE METABOLIC PANEL
ALT: 27 U/L (ref 0–44)
AST: 34 U/L (ref 15–41)
Albumin: 2.3 g/dL — ABNORMAL LOW (ref 3.5–5.0)
Alkaline Phosphatase: 96 U/L (ref 38–126)
Anion gap: 10 (ref 5–15)
BUN: 70 mg/dL — ABNORMAL HIGH (ref 8–23)
CO2: 23 mmol/L (ref 22–32)
Calcium: 7.8 mg/dL — ABNORMAL LOW (ref 8.9–10.3)
Chloride: 106 mmol/L (ref 98–111)
Creatinine, Ser: 2.32 mg/dL — ABNORMAL HIGH (ref 0.44–1.00)
GFR calc Af Amer: 20 mL/min — ABNORMAL LOW (ref >60)
GFR calc non Af Amer: 18 mL/min — ABNORMAL LOW (ref >60)
Glucose, Bld: 111 mg/dL — ABNORMAL HIGH (ref 70–99)
Potassium: 4 mmol/L (ref 3.5–5.1)
Sodium: 139 mmol/L (ref 135–145)
Total Bilirubin: 0.3 mg/dL (ref 0.3–1.2)
Total Protein: 5.7 g/dL — ABNORMAL LOW (ref 6.5–8.1)

## 2020-07-14 LAB — PROCALCITONIN: Procalcitonin: 0.66 ng/mL

## 2020-07-14 LAB — BRAIN NATRIURETIC PEPTIDE: B Natriuretic Peptide: 375.9 pg/mL — ABNORMAL HIGH (ref 0.0–100.0)

## 2020-07-14 LAB — C-REACTIVE PROTEIN: CRP: 0.8 mg/dL (ref <1.0)

## 2020-07-14 MED ORDER — METHYLPREDNISOLONE SODIUM SUCC 40 MG IJ SOLR: 10.0000 mg | Freq: Every day | INTRAMUSCULAR | Status: DC

## 2020-07-14 MED ORDER — LOPERAMIDE HCL 2 MG PO CAPS
4.0000 mg | ORAL_CAPSULE | Freq: Once | ORAL | Status: AC
  Administered 2020-07-14: 4 mg via ORAL
  Filled 2020-07-14: qty 2

## 2020-07-14 MED ORDER — LACTATED RINGERS IV SOLN: INTRAVENOUS | Status: AC

## 2020-07-14 MED ORDER — FUROSEMIDE 40 MG PO TABS
40.0000 mg | ORAL_TABLET | Freq: Once | ORAL | Status: DC
  Filled 2020-07-14: qty 1

## 2020-07-14 MED ORDER — PANTOPRAZOLE SODIUM 40 MG IV SOLR: 40.0000 mg | Freq: Two times a day (BID) | INTRAVENOUS | Status: DC

## 2020-07-14 NOTE — Progress Notes (Signed)
Physical Therapy Treatment Patient Details Name: Lisa Cortez MRN: [id number] DOB: 03-12-1925 Today's Date: 07/14/2020    History of Present Illness Lisa Cortez is a 86 y.o. female with a history of CAD s/p PCI x3, HTN, HLD, stage IV CKD, hypothyroidism, mild memory loss and covid-19 vaccination in Jan-Feb 2021 who presented to [facility] 7/21 from ALF with progressive dyspnea, cough, and weakness found to be hypoxemic requiring 2L O2.  Work-up was suggestive of NSTEMI, CHF, COVID-19 infection, AKI along with elevated procalcitonin raising suspicion of bacterial infection.    PT Comments    Continuing work on functional mobility and activity tolerance;  Session focused on initiating gait and standing tolerance; Able to sit up to EOB with minguard assist, stand, and walk very short in-room distance; on 3 L supplemental O2, and O2 sats ranged 84% to 93% throughout session;   Lisa Cortez expressed to me the desire to return to her independent living apartment -- we briefly discussed levels of care, and ultimately I continue to recommend SNF level care for post-acute rehab to maximize independence and safety with mobility prior to return to her apartment; At SNF level of care she will get more consistent rehab therapies; Overall progressing well; Anticipate continuing good progress at post-acute rehabilitation.   Follow Up Recommendations  SNF     Equipment Recommendations  None recommended by PT    Recommendations for Other Services       Precautions / Restrictions Precautions Precautions: Fall Precaution Comments: watch O2    Mobility  Bed Mobility Overal bed mobility: Needs Assistance Bed Mobility: Supine to Sit     Supine to sit: Min guard     General bed mobility comments: Minguard for safety; heavy use of bed rails; cues for technique  Transfers Overall transfer level: Needs assistance Equipment used: Rolling walker (2 wheeled) Transfers: Sit to/from Stand Sit to Stand: Min  assist         General transfer comment: Cues for scooting to EOB and initiating transfer with more forward weight shift  Ambulation/Gait Ambulation/Gait assistance: Min assist Gait Distance (Feet): 5 Feet (x2) Assistive device: Rolling walker (2 wheeled) Gait Pattern/deviations: Decreased step length - right;Decreased step length - left     General Gait Details: Cues to self-monitor for activity tolerance, and breathe as deeply as able; min assist at times to manage RW; seated rest break before standing at sink   Stairs             Wheelchair Mobility    Modified Rankin (Stroke Patients Only)       Balance     Sitting balance-Leahy Scale: Fair       Standing balance-Leahy Scale: Poor (approaching Fair) Standing balance comment: UE support for balance; stood at sink and performed ADL                            Cognition Arousal/Alertness: Awake/alert Behavior During Therapy: WFL for tasks assessed/performed Overall Cognitive Status: History of cognitive impairments - at baseline                                        Exercises      General Comments General comments (skin integrity, edema, etc.): Session conducted on 3L supplemental O2; O2 sats ranged as low as 84% (observed lowest) with activity in room; cues to slow down and  for focused breathing; end of session O2 sats 93%      Pertinent Vitals/Pain Pain Assessment: Faces Faces Pain Scale: Hurts little more Pain Location: buttocks Pain Descriptors / Indicators: Sore Pain Intervention(s): Monitored during session;Repositioned    Home Living                      Prior Function            PT Goals (current goals can now be found in the care plan section) Acute Rehab PT Goals Patient Stated Goal: to walk, get strength back PT Goal Formulation: With patient Time For Goal Achievement: 07/24/20 Progress towards PT goals: Progressing toward goals     Frequency    Min 3X/week      PT Plan Current plan remains appropriate    Co-evaluation              AM-PAC PT "6 Clicks" Mobility   Outcome Measure  Help needed turning from your back to your side while in a flat bed without using bedrails?: A Little Help needed moving from lying on your back to sitting on the side of a flat bed without using bedrails?: A Little Help needed moving to and from a bed to a chair (including a wheelchair)?: A Little Help needed standing up from a chair using your arms (e.g., wheelchair or bedside chair)?: A Lot Help needed to walk in hospital room?: A Lot Help needed climbing 3-5 steps with a railing? : Total 6 Click Score: 14    End of Session Equipment Utilized During Treatment: Oxygen Activity Tolerance: Patient limited by fatigue Patient left: in bed;with call bell/phone within reach Nurse Communication: Mobility status;Other (comment) (IV infusion complete) PT Visit Diagnosis: Other abnormalities of gait and mobility (R26.89);Muscle weakness (generalized) (M62.81)     Time: 1340-1420 PT Time Calculation (min) (ACUTE ONLY): 40 min  Charges:  $Gait Training: 8-22 mins $Therapeutic Activity: 23-37 mins                     Roney Marion, PT  Acute Rehabilitation Services Pager [phone] Office [place] 07/14/2020, 4:37 PM

## 2020-07-14 NOTE — [place] FL2 (Signed)
[place] MEDICAID FL2 LEVEL OF CARE SCREENING TOOL     IDENTIFICATION  Patient Name: Lisa Cortez Birthdate: 1925-11-27 Sex: female Admission Date (Current Location): 07/09/2020  [facility] and [place] Number:  Herbalist and Address:  [facility] [place] [address], [place], [place] [address]      Provider Number: [id number]  Attending Physician Name and Address:  Thurnell Lose, MD  Relative Name and Phone Number:  Hilda Blades, daughter, [phone]    Current Level of Care: Hospital Recommended Level of Care: [place] Prior Approval Number:    Date Approved/Denied:   PASRR Number: [id number]  Discharge Plan: SNF    Current Diagnoses: Patient Active Problem List   Diagnosis Date Noted  . NSTEMI (non-ST elevated myocardial infarction) ([place]) 07/09/2020  . Chronic kidney disease (CKD), stage IV (severe) ([place])   . Hyperlipidemia   . Hypothyroidism   . Normocytic anemia   . Transaminitis   . Diastolic dysfunction   . Acute respiratory disease due to COVID-19 virus   . Sepsis ([place]) 08/20/2019  . Pneumonia 08/20/2019  . COPD with acute exacerbation ([place]) 08/20/2019  . Acute on chronic respiratory failure with hypoxia ([place]) 08/20/2019  . Chest pain 08/20/2019  . Nausea and vomiting 08/20/2019  . Macrocytic anemia 08/20/2019  . Hypertension 08/20/2019  . 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 08/20/2019    Orientation RESPIRATION BLADDER Height & Weight     Self, Time, Situation, Place  O2 (Nasal cannula 3L) Continent, Indwelling catheter Weight: 136 lb 11 oz (62 kg) Height:  '4\' 11"'  (149.9 cm)  BEHAVIORAL SYMPTOMS/MOOD NEUROLOGICAL BOWEL NUTRITION STATUS      Incontinent Diet (Please see DC Summary)  AMBULATORY STATUS COMMUNICATION OF NEEDS Skin   Limited Assist Verbally Normal                       Personal Care Assistance Level of Assistance  Bathing, Feeding, Dressing Bathing Assistance: Limited  assistance Feeding assistance: Independent Dressing Assistance: Limited assistance     Functional Limitations Info  Sight, Hearing, Speech Sight Info: Adequate Hearing Info: Impaired Speech Info: Adequate    SPECIAL CARE FACTORS FREQUENCY  PT (By licensed PT), OT (By licensed OT)     PT Frequency: 5x/week OT Frequency: 4x/week            Contractures Contractures Info: Not present    Additional Factors Info  Code Status, Allergies, Isolation Precautions Code Status Info: DNR Allergies Info: Tenex (Guanfacine Hcl), Calcitonin, Fosamax (Alendronate Sodium), Gatifloxacin, Procrit (Epoetin (Alfa), Verapamil     Isolation Precautions Info: COVID+ on 7/21 (has had vaccines)     Current Medications (07/14/2020):  This is the current hospital active medication list Current Facility-Administered Medications  Medication Dose Route Frequency Provider Last Rate Last Admin  . acetaminophen (TYLENOL) tablet 650 mg  650 mg Oral Q6H PRN Reubin Milan, MD   650 mg at 07/12/20 2006  . albuterol (VENTOLIN HFA) 108 (90 Base) MCG/ACT inhaler 2 puff  2 puff Inhalation Q4H PRN Reubin Milan, MD   2 puff at 07/11/20 2030  . ascorbic acid (VITAMIN C) tablet 500 mg  500 mg Oral Daily Reubin Milan, MD   500 mg at 07/13/20 0900  . atorvastatin (LIPITOR) tablet 10 mg  10 mg Oral Daily Reubin Milan, MD   10 mg at 07/13/20 0857  . azithromycin (ZITHROMAX) tablet 250 mg  250 mg Oral Daily Candiss Norse,  Margaree Mackintosh, MD   250 mg at 07/13/20 0900  . carvedilol (COREG) tablet 3.125 mg  3.125 mg Oral BID WC Thurnell Lose, MD   3.125 mg at 07/13/20 0857  . cefTRIAXone (ROCEPHIN) 2 g in sodium chloride 0.9 % 100 mL IVPB  2 g Intravenous Q24H Reubin Milan, MD   Stopped at 07/12/20 2035  . Chlorhexidine Gluconate Cloth 2 % PADS 6 each  6 each Topical Daily Thurnell Lose, MD   6 each at 07/13/20 1016  . chlorpheniramine-HYDROcodone (TUSSIONEX) 10-8 MG/5ML suspension 5 mL  5 mL Oral  Q12H PRN Reubin Milan, MD   5 mL at 07/11/20 2016  . Gerhardt's butt cream   Topical TID Thurnell Lose, MD   Given at 07/13/20 2047  . guaiFENesin-dextromethorphan (ROBITUSSIN DM) 100-10 MG/5ML syrup 10 mL  10 mL Oral Q4H PRN Reubin Milan, MD   10 mL at 07/11/20 0924  . hydrALAZINE (APRESOLINE) tablet 25 mg  25 mg Oral Q8H Thurnell Lose, MD   25 mg at 07/13/20 2046  . Ipratropium-Albuterol (COMBIVENT) respimat 2 puff  2 puff Inhalation Q6H PRN Thurnell Lose, MD   2 puff at 07/13/20 0747  . isosorbide mononitrate (IMDUR) 24 hr tablet 30 mg  30 mg Oral Daily Thurnell Lose, MD   30 mg at 07/13/20 0857  . levothyroxine (SYNTHROID) tablet 75 mcg  75 mcg Oral Daily Reubin Milan, MD   75 mcg at 07/13/20 0900  . lip balm (CARMEX) ointment   Topical PRN Thurnell Lose, MD      . loperamide (IMODIUM) capsule 4 mg  4 mg Oral QID PRN Thurnell Lose, MD   4 mg at 07/13/20 2329  . memantine (NAMENDA) tablet 5 mg  5 mg Oral Daily Reubin Milan, MD   5 mg at 07/13/20 0900  . methylPREDNISolone sodium succinate (SOLU-MEDROL) 40 mg/mL injection 20 mg  20 mg Intravenous Daily Thurnell Lose, MD   20 mg at 07/13/20 0903  . mometasone-formoterol (DULERA) 200-5 MCG/ACT inhaler 2 puff  2 puff Inhalation BID Reubin Milan, MD   2 puff at 07/13/20 2047  . montelukast (SINGULAIR) tablet 10 mg  10 mg Oral QHS Reubin Milan, MD   10 mg at 07/13/20 2047  . nitroGLYCERIN (NITROSTAT) SL tablet 0.4 mg  0.4 mg Sublingual Q5 Min x 3 PRN Reubin Milan, MD      . ondansetron [facility]) injection 4 mg  4 mg Intravenous Q6H PRN Reubin Milan, MD   4 mg at 07/10/20 0555  . pantoprazole (PROTONIX) 80 mg in sodium chloride 0.9 % 100 mL (0.8 mg/mL) infusion  8 mg/hr Intravenous Continuous Thurnell Lose, MD 10 mL/hr at 07/14/20 0533 8 mg/hr at 07/14/20 0533  . pantoprazole (PROTONIX) injection 40 mg  40 mg Intravenous Q12H Thurnell Lose, MD   40 mg at 07/13/20  2047  . pramipexole (MIRAPEX) tablet 0.25 mg  0.25 mg Oral Daily Reubin Milan, MD   0.25 mg at 07/13/20 0960  . sodium bicarbonate tablet 650 mg  650 mg Oral TID Thurnell Lose, MD   650 mg at 07/13/20 2046  . tamsulosin (FLOMAX) capsule 0.4 mg  0.4 mg Oral Daily Lala Lund K, MD   0.4 mg at 07/13/20 0900  . technetium labeled red blood cells (ULTRATAG) injection kit 25 millicurie  25 millicurie Intravenous Once PRN Entrikin, Camelia Phenes, MD      .  traMADol (ULTRAM) tablet 50 mg  50 mg Oral Q6H PRN Thurnell Lose, MD   50 mg at 07/13/20 2329  . traZODone (DESYREL) tablet 25 mg  25 mg Oral QHS Chotiner, Yevonne Aline, MD   25 mg at 07/13/20 2046  . vitamin B-12 (CYANOCOBALAMIN) tablet 1,000 mcg  1,000 mcg Oral Daily Reubin Milan, MD   1,000 mcg at 07/13/20 0859  . zinc sulfate capsule 220 mg  220 mg Oral Daily Reubin Milan, MD   220 mg at 07/13/20 0900     Discharge Medications: Please see discharge summary for a list of discharge medications.  Relevant Imaging Results:  Relevant Lab Results:   Additional Information SSN: [place]  [place], [place]

## 2020-07-14 NOTE — Progress Notes (Signed)
PROGRESS NOTE                                                                                                                                                                                                             Patient Demographics:    Lisa Cortez, is a 86 y.o. female, DOB - 02-02-1925, LXB:[id number]  Outpatient Primary MD for the patient is Lajean Manes, MD    LOS - 5  Admit date - 07/09/2020    Chief Complaint  Patient presents with  . Generalized Body Aches  . Shortness of Breath       Brief Narrative - Lisa Cortez is a 86 y.o. female with a history of CAD s/p PCI x3, HTN, HLD, stage IV CKD, hypothyroidism, mild memory loss and covid-19 vaccination in Jan-Feb 2021 who presented to [facility] 7/21 from ALF with progressive dyspnea, cough, and weakness found to be hypoxemic requiring 2L O2.  Work-up was suggestive of NSTEMI, CHF, COVID-19 infection, AKI along with elevated procalcitonin raising suspicion of bacterial infection.   Subjective:   Patient in bed, appears comfortable, denies any headache, no fever, no chest pain or pressure, no shortness of breath , no abdominal pain. No focal weakness.    Assessment  & Plan :     1. Acute Hypoxic Resp. Failure due to NSTEMI causing acute on chronic diastolic CHF with EF 41% due to NSTEMI along with likely insignificant acute Covid 19 Viral infection -   Patient has crackles, elevated troponin and extremely elevated BNP along with orthopnea.  She is fully vaccinated for COVID-19 and I think her COVID-19 infection is most likely asymptomatic and incidental.  Her main issue with NSTEMI placing her in acute on chronic diastolic CHF.  He was started on IV Lasix, statin, Imdur and Coreg with much improvement in shortness of breath.  2 ongoing melanotic stools aspirin and heparin drip will be held.  Supplemental oxygen, nebulizer treatments will be continued.  Plan discussed with  family in detail of declines full comfort care.  She is DNR.  As far as COVID-19 infection is concerned she has been started on steroids and remdesivir and will finish the course.  Encouraged the patient to sit up in chair in the daytime use I-S and flutter valve for pulmonary toiletry and then prone in bed when at night.  Will advance activity and titrate down oxygen as possible.    SpO2: 94 % O2 Flow Rate (L/min): 3 L/min  Recent Labs  Lab 07/09/20 1224 07/09/20 1550 07/09/20 1550 07/09/20 1620 07/09/20 1726 07/09/20 1930 07/10/20 0559 07/10/20 0559 07/10/20 0600 07/11/20 1336 07/11/20 1336 07/12/20 0426 07/12/20 1852 07/12/20 2325 07/13/20 0331 07/14/20 0425  WBC  --   --   --   --    < >  --  5.4   < >  --  6.7   < > 3.7* 4.0 4.1 3.7* 5.3  PLT  --   --   --   --    < >  --  191   < >  --  201   < > 184 171 164 151 172  CRP  --  4.2*   < >  --   --   --   --   --  4.2* 2.1*  --  2.5*  --   --  1.0* 0.8  DDIMER  --  2.61*  --   --   --   --  1.67*  --   --  0.69*  --  0.68*  --   --  1.13*  --   PROCALCITON  --  6.33  --   --   --   --   --   --   --  3.62  --  3.38  --   --  1.66 0.66  LATICACIDVEN  --   --   --  1.5  --  1.1  --   --   --   --   --   --   --   --   --   --   SARSCOV2NAA POSITIVE*  --   --   --   --   --   --   --   --   --   --   --   --   --   --   --    < > = values in this interval not displayed.    Hepatic Function Latest Ref Rng & Units 07/14/2020 07/13/2020 07/12/2020  Total Protein 6.5 - 8.1 g/dL 5.7(L) 5.4(L) 5.7(L)  Albumin 3.5 - 5.0 g/dL 2.3(L) 2.2(L) 2.3(L)  AST 15 - 41 U/L 34 47(H) 72(H)  ALT 0 - 44 U/L 27 31 42  Alk Phosphatase 38 - 126 U/L 96 103 116  Total Bilirubin 0.3 - 1.2 mg/dL 0.3 0.5 0.3    2.  NSTEMI with acute on chronic diastolic CHF EF 12%.  See #1 above.  3.  Ongoing upper GI bleed causing dark stools with falling H&H.  Heparin drip and aspirin were stopped on 07/11/2020, she is s/p 1 unit packed RBC transfusion on 07/12/2020,  negative tagged RBC scan .  IV PPI drip bleeding seems to have resolved.  H&H now stable.  Continue to monitor.  Not a candidate for invasive testing.  4.  AKI on CKD 3.  Baseline creatinine around 1.4.  Worse due to hypotension acute blood loss, transfuse and monitor.  This could be her new baseline.  5.  Urinary retention.  Foley, Flomax.  Will discontinue Foley this evening.  6.  Hypothyroidism.  On Synthroid.  7.  History of COPD.  No acute issues.  No wheezing.  Supportive care.  8.  Possible overlapping bacterial atypical lung infection.  Finished antibiotic course.  9.  Elevated LFTs due to hepatic congestion from  CHF.  Improving.    Condition - Extremely Guarded  Family Communication  :  Daughter 07/11/20, grand daughter  Amret [phone] on 07/11/20, 07/12/20,  daughter on the floor on 07/13/2020 in person.  Code Status : DNR, gentle medical treatment if declines full comfort care.  Consults  : Cards  Procedures  :   Tagged RBC scan - no active bleeding  TTE - 1. Left ventricular ejection fraction, by estimation, is 50 to 55%. The left ventricle has low normal function. The left ventricle demonstrates regional wall motion abnormalities (see scoring diagram/findings for description). Apical akinesis. There is moderate asymmetric left ventricular hypertrophy of the basal-septal segment. Left ventricular diastolic parameters are consistent with Grade II diastolic dysfunction (pseudonormalization). Elevated left atrial pressure.  2. Right ventricular systolic function is normal. The right ventricular size is normal. Tricuspid regurgitation signal is inadequate for assessing PA pressure.  3. The mitral valve is abnormal. Moderate mitral annular calcification. No evidence of mitral valve regurgitation.  4. The aortic valve was not well visualized. Aortic valve regurgitation is trivial. No aortic stenosis is present.    PUD Prophylaxis : IV PPI  Disposition Plan  :    Status is:  Inpatient  Remains inpatient appropriate because:IV treatments appropriate due to intensity of illness or inability to take PO   Dispo: The patient is from: SNF              Anticipated d/c is to: SNF              Anticipated d/c date is: > 3 days              Patient currently is not medically stable to d/c.   DVT Prophylaxis  :   SCDs    Lab Results  Component Value Date   PLT 172 07/14/2020    Diet :  Diet Order            Diet clear liquid Room service appropriate? Yes; Fluid consistency: Thin  Diet effective now                  Inpatient Medications  Scheduled Meds: . vitamin C  500 mg Oral Daily  . atorvastatin  10 mg Oral Daily  . carvedilol  3.125 mg Oral BID WC  . Chlorhexidine Gluconate Cloth  6 each Topical Daily  . furosemide  40 mg Oral Once  . Gerhardt's butt cream   Topical TID  . hydrALAZINE  25 mg Oral Q8H  . isosorbide mononitrate  30 mg Oral Daily  . levothyroxine  75 mcg Oral Daily  . loperamide  4 mg Oral Once  . memantine  5 mg Oral Daily  . [START ON 07/15/2020] methylPREDNISolone (SOLU-MEDROL) injection  10 mg Intravenous Daily  . mometasone-formoterol  2 puff Inhalation BID  . montelukast  10 mg Oral QHS  . pantoprazole (PROTONIX) IV  40 mg Intravenous Q12H  . pramipexole  0.25 mg Oral Daily  . sodium bicarbonate  650 mg Oral TID  . tamsulosin  0.4 mg Oral Daily  . traZODone  25 mg Oral QHS  . vitamin B-12  1,000 mcg Oral Daily  . zinc sulfate  220 mg Oral Daily   Continuous Infusions: . pantoprozole (PROTONIX) infusion 8 mg/hr (07/14/20 0533)   PRN Meds:.acetaminophen **OR** [DISCONTINUED] acetaminophen, albuterol, chlorpheniramine-HYDROcodone, guaiFENesin-dextromethorphan, Ipratropium-Albuterol, lip balm, loperamide, nitroGLYCERIN, ondansetron (ZOFRAN) IV, technetium labeled red blood cells, traMADol  Antibiotics  :    Anti-infectives (From admission, onward)  Start     Dose/Rate Route Frequency Ordered Stop   07/11/20 1230   azithromycin (ZITHROMAX) tablet 250 mg        250 mg Oral Daily 07/11/20 1221 07/14/20 1031   07/10/20 1000  remdesivir 100 mg in sodium chloride 0.9 % 100 mL IVPB        100 mg 200 mL/hr over 30 Minutes Intravenous Daily 07/09/20 2111 07/13/20 1100   07/09/20 2200  remdesivir 100 mg in sodium chloride 0.9 % 100 mL IVPB        100 mg 200 mL/hr over 30 Minutes Intravenous Every 1 hr x 2 07/09/20 2111 07/10/20 0153   07/09/20 2030  cefTRIAXone (ROCEPHIN) 2 g in sodium chloride 0.9 % 100 mL IVPB  Status:  Discontinued        2 g 200 mL/hr over 30 Minutes Intravenous Every 24 hours 07/09/20 1945 07/14/20 1043   07/09/20 2030  azithromycin (ZITHROMAX) 500 mg in sodium chloride 0.9 % 250 mL IVPB  Status:  Discontinued        500 mg 250 mL/hr over 60 Minutes Intravenous Every 24 hours 07/09/20 1945 07/10/20 1314       Time Spent in minutes  30   Lala Lund M.D on 07/14/2020 at 10:48 AM  To page go to [email or web address] - password [facility]  [facility] -  Office  [phone]   See all Orders from today for further details    Objective:   Vitals:   07/14/20 0359 07/14/20 0400 07/14/20 0554 07/14/20 0758  BP:  (!) 148/60 117/67 (!) 130/65  Pulse: 63 64  64  Resp: '21 15  14  ' Temp:  98.3 F (36.8 C)  97.8 F (36.6 C)  TempSrc:  Axillary  Oral  SpO2: 96% 97%  94%  Weight: 62 kg     Height:        Wt Readings from Last 3 Encounters:  07/14/20 62 kg  08/24/19 68.8 kg     Intake/Output Summary (Last 24 hours) at 07/14/2020 1048 Last data filed at 07/14/2020 1011 Gross per 24 hour  Intake 661.89 ml  Output 1826 ml  Net -1164.11 ml     Physical Exam  Awake Alert, No new F.N deficits, Normal affect [place].AT,PERRAL Supple Neck,No JVD, No cervical lymphadenopathy appriciated.  Symmetrical Chest wall movement, Good air movement bilaterally, CTAB RRR,No Gallops, Rubs or new Murmurs, No Parasternal Heave +ve B.Sounds, Abd Soft, No tenderness, No organomegaly appriciated, No  rebound - guarding or rigidity. No Cyanosis, Clubbing or edema, No new Rash or bruise    Data Review:    CBC Recent Labs  Lab 07/09/20 1726 07/10/20 0559 07/12/20 0426 07/12/20 1852 07/12/20 2325 07/13/20 0331 07/14/20 0425  WBC 7.3   < > 3.7* 4.0 4.1 3.7* 5.3  HGB 10.3*   < > 8.7* 10.3* 9.8* 9.7* 10.4*  HCT 33.4*   < > 27.8* 31.7* 29.9* 30.2* 32.1*  PLT 186   < > 184 171 164 151 172  MCV 97.7   < > 95.9 91.6 90.6 91.8 91.7  MCH 30.1   < > 30.0 29.8 29.7 29.5 29.7  MCHC 30.8   < > 31.3 32.5 32.8 32.1 32.4  RDW 14.4   < > 14.1 14.6 14.5 14.6 14.5  LYMPHSABS 1.4  --   --   --   --   --   --   MONOABS 1.3*  --   --   --   --   --   --  EOSABS 0.0  --   --   --   --   --   --   BASOSABS 0.0  --   --   --   --   --   --    < > = values in this interval not displayed.    Chemistries  Recent Labs  Lab 07/09/20 1550 07/09/20 1550 07/10/20 0559 07/11/20 1336 07/12/20 0426 07/13/20 0331 07/14/20 0425  NA 137   < > 137 136 138 137 139  K 4.6   < > 4.1 3.7 3.6 3.5 4.0  CL 107   < > 111 107 107 105 106  CO2 17*   < > 14* 20* 18* 20* 23  GLUCOSE 73   < > 120* 109* 142* 119* 111*  BUN 35*   < > 42* 56* 72* 70* 70*  CREATININE 1.79*   < > 1.50* 1.67* 2.25* 2.20* 2.32*  CALCIUM 8.3*   < > 7.8* 7.8* 7.7* 7.4* 7.8*  AST 173*   < > 132* 95* 72* 47* 34  ALT 67*   < > 53* 43 42 31 27  ALKPHOS 185*   < > 149* 140* 116 103 96  BILITOT 0.5   < > 0.5 0.5 0.3 0.5 0.3  MG  --   --  2.1 2.0 2.1 1.8 1.9  INR 1.0  --   --   --   --   --   --    < > = values in this interval not displayed.     ------------------------------------------------------------------------------------------------------------------ No results for input(s): CHOL, HDL, LDLCALC, TRIG, CHOLHDL, LDLDIRECT in the last 72 hours.  No results found for: HGBA1C ------------------------------------------------------------------------------------------------------------------ No results for input(s): TSH, T4TOTAL,  T3FREE, THYROIDAB in the last 72 hours.  Invalid input(s): FREET3  Cardiac Enzymes No results for input(s): CKMB, TROPONINI, MYOGLOBIN in the last 168 hours.  Invalid input(s): CK ------------------------------------------------------------------------------------------------------------------    Component Value Date/Time   BNP 375.9 (H) 07/14/2020 0425    Micro Results Recent Results (from the past 240 hour(s))  SARS Coronavirus 2 by RT PCR (hospital order, performed in [facility] hospital lab) Nasopharyngeal Nasopharyngeal Swab     Status: Abnormal   Collection Time: 07/09/20 12:24 PM   Specimen: Nasopharyngeal Swab  Result Value Ref Range Status   SARS Coronavirus 2 POSITIVE (A) NEGATIVE Final    Comment: RESULT CALLED TO, READ BACK BY AND VERIFIED WITH: FRANKLIN,C. RN '@1330'  07/09/20 BILLINGSLEY,L (NOTE) SARS-CoV-2 target nucleic acids are DETECTED  SARS-CoV-2 RNA is generally detectable in upper respiratory specimens  during the acute phase of infection.  Positive results are indicative  of the presence of the identified virus, but do not rule out bacterial infection or co-infection with other pathogens not detected by the test.  Clinical correlation with patient history and  other diagnostic information is necessary to determine patient infection status.  The expected result is negative.  Fact Sheet for Patients:   StrictlyIdeas.no   Fact Sheet for Healthcare Providers:   BankingDealers.co.za    This test is not yet approved or cleared by the [place] FDA and  has been authorized for detection and/or diagnosis of SARS-CoV-2 by FDA under an Emergency Use Authorization (EUA).  This EUA will remain in effect (meaning  this test can be used) for the duration of  the COVID-19 declaration under Section 564(b)(1) of the Act, 21 U.S.C. section 360-bbb-3(b)(1), unless the authorization is terminated or revoked  sooner.  Performed at [facility], [place] [address] [place], [place] [address]   Blood Culture (routine x 2)     Status: Abnormal   Collection Time: 07/09/20  3:50 PM   Specimen: BLOOD LEFT HAND  Result Value Ref Range Status   Specimen Description   Final    BLOOD LEFT HAND Performed at [facility] [address] [place], [place] [address]    Special Requests   Final    BOTTLES DRAWN AEROBIC ONLY Blood Culture results may not be optimal due to an inadequate volume of blood received in culture bottles Performed at [facility] [address], [place], [place] [address]    Culture  Setup Time   Final    AEROBIC BOTTLE ONLY GRAM POSITIVE COCCI IN CLUSTERS CRITICAL RESULT CALLED TO, READ BACK BY AND VERIFIED WITH: Melodye Ped PHARMD [id number] 07/10/20 A BROWNING    Culture (A)  Final    STAPHYLOCOCCUS SPECIES (COAGULASE NEGATIVE) THE SIGNIFICANCE OF ISOLATING THIS ORGANISM FROM A SINGLE SET OF BLOOD CULTURES WHEN MULTIPLE SETS ARE DRAWN IS UNCERTAIN. PLEASE NOTIFY THE MICROBIOLOGY DEPARTMENT WITHIN ONE WEEK IF SPECIATION AND SENSITIVITIES ARE REQUIRED. Performed at [facility], [place] [address], [place], [place] [address]    Report Status 07/11/2020 FINAL  Final  Blood Culture ID Panel (Reflexed)     Status: Abnormal   Collection Time: 07/09/20  3:50 PM  Result Value Ref Range Status   Enterococcus species NOT DETECTED NOT DETECTED Final   Listeria monocytogenes NOT DETECTED NOT DETECTED Final   Staphylococcus species DETECTED (A) NOT DETECTED Final    Comment: Methicillin (oxacillin) resistant coagulase negative staphylococcus. Possible blood culture contaminant (unless isolated from more than one blood culture draw or clinical case suggests pathogenicity). No antibiotic treatment is indicated for blood  culture contaminants. CRITICAL RESULT CALLED TO, READ BACK BY AND VERIFIED WITH: Melodye Ped PHARMD [id number] 07/10/20 A BROWNING     Staphylococcus aureus (BCID) NOT DETECTED NOT DETECTED Final   Methicillin resistance DETECTED (A) NOT DETECTED Final    Comment: CRITICAL RESULT CALLED TO, READ BACK BY AND VERIFIED WITH: Melodye Ped PHARMD [phone] 07/10/20 A BROWNING    Streptococcus species NOT DETECTED NOT DETECTED Final   Streptococcus agalactiae NOT DETECTED NOT DETECTED Final   Streptococcus pneumoniae NOT DETECTED NOT DETECTED Final   Streptococcus pyogenes NOT DETECTED NOT DETECTED Final   Acinetobacter baumannii NOT DETECTED NOT DETECTED Final   Enterobacteriaceae species NOT DETECTED NOT DETECTED Final   Enterobacter cloacae complex NOT DETECTED NOT DETECTED Final   Escherichia coli NOT DETECTED NOT DETECTED Final   Klebsiella oxytoca NOT DETECTED NOT DETECTED Final   Klebsiella pneumoniae NOT DETECTED NOT DETECTED Final   Proteus species NOT DETECTED NOT DETECTED Final   Serratia marcescens NOT DETECTED NOT DETECTED Final   Haemophilus influenzae NOT DETECTED NOT DETECTED Final   Neisseria meningitidis NOT DETECTED NOT DETECTED Final   Pseudomonas aeruginosa NOT DETECTED NOT DETECTED Final   Candida albicans NOT DETECTED NOT DETECTED Final   Candida glabrata NOT DETECTED NOT DETECTED Final   Candida krusei NOT DETECTED NOT DETECTED Final   Candida parapsilosis NOT DETECTED NOT DETECTED Final   Candida tropicalis NOT DETECTED NOT DETECTED Final    Comment: Performed at [facility], [place]. [address], [place], [place] [address]  MRSA PCR Screening     Status: None   Collection Time: 07/10/20  8:25 PM   Specimen: Nasal Mucosa; Nasopharyngeal  Result Value Ref Range  Status   MRSA by PCR NEGATIVE NEGATIVE Final    Comment:        The GeneXpert MRSA Assay (FDA approved for NASAL specimens only), is one component of a comprehensive MRSA colonization surveillance program. It is not intended to diagnose MRSA infection nor to guide or monitor treatment for MRSA infections. Performed at [facility], [place] [address], [place], [place] [address]     Radiology Reports NM GI Blood Loss  Result Date: 07/13/2020 CLINICAL DATA:  GI bleeding, dark bloody stools EXAM: NUCLEAR MEDICINE GASTROINTESTINAL BLEEDING SCAN TECHNIQUE: Sequential abdominal images were obtained following intravenous administration of Tc-86mlabeled red blood cells. RADIOPHARMACEUTICALS:  26.2 mCi Tc-984mertechnetate in-vitro labeled red cells. COMPARISON:  None FINDINGS: Normal blood pool distribution of labeled red cells. No abnormal gastrointestinal localization of tracer identified to suggest active GI bleeding. IMPRESSION: Negative GI bleeding scan. Electronically Signed   By: MaLavonia Dana.D.   On: 07/13/2020 11:30   DG Chest Port 1 View  Result Date: 07/13/2020 CLINICAL DATA:  Shob/ covid + Evaluate Increasing bilateral interstitial prominence, interstitial edema and or pneumonitis. Tiny bilateral pleural effusions EXAM: PORTABLE CHEST - 1 VIEW COMPARISON:  07/11/2020 FINDINGS: Asymmetric interstitial and peripheral airspace opacities, right greater than left, involving bases more than upper lobes. Slight worsening of airspace opacities at the right lung base partially obscuring the right diaphragmatic leaflet. Blunting of bilateral costophrenic angles suggesting small effusions. Heart size upper limits normal for technique. Aortic Atherosclerosis (ICD10-170.0). No pneumothorax. Visualized bones unremarkable. IMPRESSION: 1. Worsening bilateral airspace disease, right greater than left. 2. Probable small pleural effusions. Electronically Signed   By: D Lucrezia Europe.D.   On: 07/13/2020 09:19   DG Chest Port 1 View  Result Date: 07/11/2020 CLINICAL DATA:  Shortness of breath. EXAM: PORTABLE CHEST 1 VIEW COMPARISON:  07/10/2020. FINDINGS: Cardiomegaly. Bilateral interstitial prominence, right side greater than left noted. Interstitial prominence is increased from prior exam. Findings suggest interstitial edema and/or pneumonitis.  Tiny bilateral pleural effusions can not be excluded. No pneumothorax. IMPRESSION: 1.  Cardiomegaly. 2. Increasing bilateral interstitial prominence, right side greater than left. Findings suggest interstitial edema and or pneumonitis. Tiny bilateral pleural effusions can not be excluded. Electronically Signed   By: ThMarcello MooresRegister   On: 07/11/2020 09:37   DG CHEST PORT 1 VIEW  Result Date: 07/10/2020 CLINICAL DATA:  COVID positive EXAM: PORTABLE CHEST 1 VIEW COMPARISON:  07/09/2020 FINDINGS: Small area of increased density at the right lung base. Stable interstitial prominence. No new significant pleural effusion. No pneumothorax. Stable heart size. Probable hiatal hernia. IMPRESSION: New small area of patchy atelectasis/consolidation at the right lung base. Electronically Signed   By: PrMacy Mis.D.   On: 07/10/2020 15:19   DG Chest Port 1 View  Result Date: 07/09/2020 CLINICAL DATA:  Cough, shortness of breath. Additional provided: Patient reports generalized body aches, shortness of breath for 3 days, sick contacts EXAM: PORTABLE CHEST 1 VIEW COMPARISON:  Prior chest radiographs 08/22/2019 and earlier FINDINGS: Heart size within normal limits. Aortic atherosclerosis. No appreciable airspace consolidation or pulmonary edema. No evidence of pleural effusion or pneumothorax. No acute bony abnormality identified. Left retrocardiac opacity suspicious for hiatal hernia. IMPRESSION: No evidence of acute cardiopulmonary abnormality. Aortic Atherosclerosis (ICD10-I70.0). Suspected hiatal hernia. Electronically Signed   By: KyKellie SimmeringO   On: 07/09/2020 12:03   ECHOCARDIOGRAM COMPLETE  Result Date: 07/10/2020    ECHOCARDIOGRAM REPORT   Patient Name:   PEKEONDA DOWate  of Exam: 07/10/2020 Medical Rec #:  [id number]    Height:       59.0 in Accession #:    [id number]   Weight:       135.6 lb Date of Birth:  03-13-1925     BSA:          1.563 m Patient Age:    63 years     BP:           119/60 mmHg Patient  Gender: F            HR:           75 bpm. Exam Location:  Inpatient Procedure: 2D Echo Indications:    acute coronary Syndrome i24.9                  Acute MI 410  History:        Patient has prior history of Echocardiogram examinations, most                 recent 08/22/2019. COPD; Risk Factors:Hypertension, Dyslipidemia                 and Former Smoker. AS. AI.  Sonographer:    Jannett Celestine RDCS (AE) Referring Phys: [id number] Eugenio Saenz  Sonographer Comments: Image acquisition challenging due to respiratory motion. restricted mobility IMPRESSIONS  1. Left ventricular ejection fraction, by estimation, is 50 to 55%. The left ventricle has low normal function. The left ventricle demonstrates regional wall motion abnormalities (see scoring diagram/findings for description). Apical akinesis. There is moderate asymmetric left ventricular hypertrophy of the basal-septal segment. Left ventricular diastolic parameters are consistent with Grade II diastolic dysfunction (pseudonormalization). Elevated left atrial pressure.  2. Right ventricular systolic function is normal. The right ventricular size is normal. Tricuspid regurgitation signal is inadequate for assessing PA pressure.  3. The mitral valve is abnormal. Moderate mitral annular calcification. No evidence of mitral valve regurgitation.  4. The aortic valve was not well visualized. Aortic valve regurgitation is trivial. No aortic stenosis is present. FINDINGS  Left Ventricle: Left ventricular ejection fraction, by estimation, is 50 to 55%. The left ventricle has low normal function. The left ventricle demonstrates regional wall motion abnormalities. The left ventricular internal cavity size was small. There is moderate asymmetric left ventricular hypertrophy of the basal-septal segment. Left ventricular diastolic parameters are consistent with Grade II diastolic dysfunction (pseudonormalization). Elevated left atrial pressure.  LV Wall Scoring: The apical  septal segment, apical anterior segment, and apex are akinetic. The anterior wall, entire lateral wall, anterior septum, entire inferior wall, mid inferoseptal segment, and basal inferoseptal segment are normal. Right Ventricle: The right ventricular size is normal. Right vetricular wall thickness was not assessed. Right ventricular systolic function is normal. Tricuspid regurgitation signal is inadequate for assessing PA pressure. Left Atrium: Left atrial size was normal in size. Right Atrium: Right atrial size was not well visualized. Pericardium: Trivial pericardial effusion is present. Mitral Valve: The mitral valve is abnormal. Moderate mitral annular calcification. No evidence of mitral valve regurgitation. Tricuspid Valve: The tricuspid valve is normal in structure. Tricuspid valve regurgitation is trivial. Aortic Valve: The aortic valve was not well visualized. Aortic valve regurgitation is trivial. No aortic stenosis is present. Aortic valve mean gradient measures 5.0 mmHg. Aortic valve peak gradient measures 10.0 mmHg. Aortic valve area, by VTI measures 0.89 cm. Pulmonic Valve: The pulmonic valve was not well visualized. Pulmonic valve regurgitation is not visualized. Aorta: The aortic root is normal  in size and structure. IAS/Shunts: The interatrial septum was not well visualized.  LEFT VENTRICLE PLAX 2D LVIDd:         3.30 cm  Diastology LVIDs:         2.20 cm  LV e' lateral:   4.79 cm/s LV PW:         1.10 cm  LV E/e' lateral: 14.8 LV IVS:        1.60 cm  LV e' medial:    3.15 cm/s LVOT diam:     1.80 cm  LV E/e' medial:  22.6 LV SV:         31 LV SV Index:   20 LVOT Area:     2.54 cm  RIGHT VENTRICLE RV S prime:     10.80 cm/s TAPSE (M-mode): 1.7 cm LEFT ATRIUM             Index LA diam:        3.10 cm 1.98 cm/m LA Vol (A2C):   35.9 ml 22.96 ml/m LA Vol (A4C):   30.4 ml 19.44 ml/m LA Biplane Vol: 32.3 ml 20.66 ml/m  AORTIC VALVE AV Area (Vmax):    1.30 cm AV Area (Vmean):   1.07 cm AV Area  (VTI):     0.89 cm AV Vmax:           158.00 cm/s AV Vmean:          112.000 cm/s AV VTI:            0.345 m AV Peak Grad:      10.0 mmHg AV Mean Grad:      5.0 mmHg LVOT Vmax:         80.80 cm/s LVOT Vmean:        47.300 cm/s LVOT VTI:          0.121 m LVOT/AV VTI ratio: 0.35  AORTA Ao Root diam: 2.70 cm MITRAL VALVE MV Area (PHT): 3.60 cm     SHUNTS MV Decel Time: 211 msec     Systemic VTI:  0.12 m MV E velocity: 71.10 cm/s   Systemic Diam: 1.80 cm MV A velocity: 120.00 cm/s MV E/A ratio:  0.59 Oswaldo Milian MD Electronically signed by Oswaldo Milian MD Signature Date/Time: 07/10/2020/8:45:41 PM    Final

## 2020-07-14 NOTE — TOC Progression Note (Addendum)
Transition of Care [facility]) - Progression Note    Patient Details  Name: Lisa Cortez MRN: [id number] Date of Birth: 09/19/1925  Transition of Care [facility]) CM/SW [place], LCSW Phone Number: 07/14/2020, 11:33 AM  Clinical Narrative:    CSW spoke with patient regarding PT recommendation of SNF placement. She is in agreement with plan and requests CSW contact her daughter or granddaughter. CSW spoke with patient's daughter regarding patient's discharge plan. CSW explained that after speaking with Claiborne Billings in admissions at [facility], they do not have any rehab beds available for patient. Patient's daughter is in agreement to send referral to other SNFs accepting COVID. CSW will initiate insurance approval process once updated PT note is in and provide bed offers to daughter.   1pm-CSW provided two bed offers to patient's daughter. She selected [facility]. Insurance authorization started.    Expected Discharge Plan: Skilled Nursing Facility Barriers to Discharge: Ship broker  Expected Discharge Plan and Services Expected Discharge Plan: [place] In-house Referral: Clinical Social Work Discharge Planning Services: CM Consult Post Acute Care Choice: [place] Living arrangements for the past 2 months: [place]                                       Social Determinants of Health (SDOH) Interventions    Readmission Risk Interventions Readmission Risk Prevention Plan 08/24/2019  Transportation Screening Complete  HRI or Home Care Consult Complete  Social Work Consult for [place] Planning/Counseling Complete  Palliative Care Screening Not Applicable  Medication Review Press photographer) Complete

## 2020-07-14 NOTE — Progress Notes (Signed)
Occupational Therapy Evaluation Patient Details Name: Lisa Cortez MRN: [id number] DOB: 1925/06/13 Today's Date: 07/14/2020    History of Present Illness Lisa Cortez is a 86 y.o. female with a history of CAD s/p PCI x3, HTN, HLD, stage IV CKD, hypothyroidism, mild memory loss and covid-19 vaccination in Jan-Feb 2021 who presented to [facility] 7/21 from ALF with progressive dyspnea, cough, and weakness found to be hypoxemic requiring 2L O2.  Work-up was suggestive of NSTEMI, CHF, COVID-19 infection, AKI along with elevated procalcitonin raising suspicion of bacterial infection.   Clinical Impression   Patient here from ILF where she states she uses a rollator for mobility and completes ADLs without assist.  Today she presentsd with decreased activity tolerance, strength, and cognition.  Patient requiring min assist with bed mobility and transfer to chair.  She requires cueing for sequencing and safety with stand pivot, and noted she was very fearful of falling.  Patient requiring set up-min assist for UB ADLs and mod assist with LB.  If patient can receive therapy services and increased assist at ILF that would be ideal discharge plan as patient refusing SNF.     Follow Up Recommendations  Supervision/Assistance - 24 hour;Other (comment);Home health OT (Return to ALF with increased services?)    Equipment Recommendations  None recommended by OT    Recommendations for Other Services       Precautions / Restrictions Precautions Precautions: Fall Precaution Comments: watch O2 Restrictions Weight Bearing Restrictions: No      Mobility Bed Mobility Overal bed mobility: Needs Assistance Bed Mobility: Supine to Sit     Supine to sit: Min assist        Transfers Overall transfer level: Needs assistance Equipment used: 2 person hand held assist Transfers: Sit to/from [facility] Sit to Stand: Min assist Stand pivot transfers: Min assist       General transfer  comment: Trouble sequencing during transfer. Needing cues for safety.  Very concerned about falling    Balance Overall balance assessment: Needs assistance Sitting-balance support: Bilateral upper extremity supported Sitting balance-Leahy Scale: Fair     Standing balance support: Bilateral upper extremity supported Standing balance-Leahy Scale: Poor Standing balance comment: UE support for balance                           ADL either performed or assessed with clinical judgement   ADL Overall ADL's : Needs assistance/impaired Eating/Feeding: Sitting;Set up   Grooming: Set up;Sitting   Upper Body Bathing: Minimal assistance;Sitting   Lower Body Bathing: Moderate assistance;Sitting/lateral leans;Sit to/from stand   Upper Body Dressing : Minimal assistance;Sitting   Lower Body Dressing: Moderate assistance;Sitting/lateral leans;Sit to/from stand   Toilet Transfer: Minimal assistance;BSC;Stand-pivot;RW   Toileting- Clothing Manipulation and Hygiene: Moderate assistance;Sit to/from stand;Sitting/lateral lean       Functional mobility during ADLs: Minimal assistance;Rolling walker       Vision Baseline Vision/History: Wears glasses Patient Visual Report: No change from baseline       Perception     Praxis      Pertinent Vitals/Pain Pain Assessment: Faces Faces Pain Scale: Hurts little more Pain Location: buttocks Pain Descriptors / Indicators: Sore Pain Intervention(s): Limited activity within patient's tolerance;Monitored during session;Repositioned     Hand Dominance Right   Extremity/Trunk Assessment Upper Extremity Assessment Upper Extremity Assessment: Generalized weakness   Lower Extremity Assessment Lower Extremity Assessment: Generalized weakness   Cervical / Trunk Assessment Cervical / Trunk Assessment: Kyphotic  Communication Communication Communication: HOH   Cognition Arousal/Alertness: Awake/alert Behavior During Therapy: WFL  for tasks assessed/performed Overall Cognitive Status: History of cognitive impairments - at baseline                                 General Comments: Hx of dementia.  Patient oriented and following commands well.  Seems confused when answering questions about PLOF and home setup, but may be due to [facility]. Patient had poor safety awareness and awareness of defifcits.   General Comments  Patient on 3L O2, SpO2 remaining 90-91 throughout session.  BP orthostatic and patient symptomatic, 128/59 sitting and 106/62 standing    Exercises     Shoulder Instructions      Home Living Family/patient expects to be discharged to:: Private residence Living Arrangements: Alone Available Help at Discharge: Family;Available PRN/intermittently Type of Home: Independent living facility Home Access: Elevator     Home Layout: One level     Bathroom Shower/Tub: Hospital doctor Toilet: Handicapped height     Home Equipment: Environmental consultant - 4 wheels;Cane - single point;Shower seat - built in   Additional Comments: ILF at [facility].  States there are nurses right outside her apartment that can help.  States there is a [facility]" with therapy       Prior Functioning/Environment Level of Independence: Independent with assistive device(s)        Comments: reports using rollator for ambulation. eats at dining facility        OT Problem List: Decreased strength;Decreased activity tolerance;Impaired balance (sitting and/or standing);Decreased cognition;Decreased safety awareness;Decreased knowledge of use of DME or AE;Cardiopulmonary status limiting activity;Pain      OT Treatment/Interventions: Self-care/ADL training;Therapeutic exercise;Energy conservation;DME and/or AE instruction;Therapeutic activities;Cognitive remediation/compensation;Patient/family education;Balance training    OT Goals(Current goals can be found in the care plan section) Acute Rehab OT Goals Patient  Stated Goal: to walk, get strength back OT Goal Formulation: With patient Time For Goal Achievement: 07/28/20 Potential to Achieve Goals: Good  OT Frequency: Min 2X/week   Barriers to D/C:            Co-evaluation              AM-PAC OT "6 Clicks" Daily Activity     Outcome Measure Help from another person eating meals?: A Little Help from another person taking care of personal grooming?: A Little Help from another person toileting, which includes using toliet, bedpan, or urinal?: A Lot Help from another person bathing (including washing, rinsing, drying)?: A Lot Help from another person to put on and taking off regular upper body clothing?: A Little Help from another person to put on and taking off regular lower body clothing?: A Lot 6 Click Score: 15   End of Session Nurse Communication: Mobility status  Activity Tolerance: Patient tolerated treatment well Patient left: in chair;with call bell/phone within reach;with chair alarm set;with nursing/sitter in room  OT Visit Diagnosis: Unsteadiness on feet (R26.81);History of falling (Z91.81);Muscle weakness (generalized) (M62.81);Other symptoms and signs involving cognitive function;Pain Pain - part of body:  (buttocks)                Time: 1016-1040 OT Time Calculation (min): 24 min Charges:  OT General Charges $OT Visit: 1 Visit OT Evaluation $OT Eval Moderate Complexity: 1 Mod OT Treatments $Therapeutic Activity: 8-22 mins  August Luz, OTR/L   Phylliss Bob 07/14/2020, 11:17 AM

## 2020-07-15 LAB — COMPREHENSIVE METABOLIC PANEL
ALT: 23 U/L (ref 0–44)
AST: 29 U/L (ref 15–41)
Albumin: 2.3 g/dL — ABNORMAL LOW (ref 3.5–5.0)
Alkaline Phosphatase: 95 U/L (ref 38–126)
Anion gap: 10 (ref 5–15)
BUN: 62 mg/dL — ABNORMAL HIGH (ref 8–23)
CO2: 22 mmol/L (ref 22–32)
Calcium: 7.9 mg/dL — ABNORMAL LOW (ref 8.9–10.3)
Chloride: 106 mmol/L (ref 98–111)
Creatinine, Ser: 2.16 mg/dL — ABNORMAL HIGH (ref 0.44–1.00)
GFR calc Af Amer: 22 mL/min — ABNORMAL LOW (ref >60)
GFR calc non Af Amer: 19 mL/min — ABNORMAL LOW (ref >60)
Glucose, Bld: 128 mg/dL — ABNORMAL HIGH (ref 70–99)
Potassium: 4 mmol/L (ref 3.5–5.1)
Sodium: 138 mmol/L (ref 135–145)
Total Bilirubin: 0.9 mg/dL (ref 0.3–1.2)
Total Protein: 5.6 g/dL — ABNORMAL LOW (ref 6.5–8.1)

## 2020-07-15 LAB — CBC
HCT: 31.9 % — ABNORMAL LOW (ref 36.0–46.0)
Hemoglobin: 10.6 g/dL — ABNORMAL LOW (ref 12.0–15.0)
MCH: 30.6 pg (ref 26.0–34.0)
MCHC: 33.2 g/dL (ref 30.0–36.0)
MCV: 92.2 fL (ref 80.0–100.0)
Platelets: 173 10*3/uL (ref 150–400)
RBC: 3.46 MIL/uL — ABNORMAL LOW (ref 3.87–5.11)
RDW: 14.2 % (ref 11.5–15.5)
WBC: 4.9 10*3/uL (ref 4.0–10.5)
nRBC: 0 % (ref 0.0–0.2)

## 2020-07-15 LAB — C-REACTIVE PROTEIN: CRP: 1.3 mg/dL — ABNORMAL HIGH (ref <1.0)

## 2020-07-15 LAB — BRAIN NATRIURETIC PEPTIDE: B Natriuretic Peptide: 322.4 pg/mL — ABNORMAL HIGH (ref 0.0–100.0)

## 2020-07-15 LAB — MAGNESIUM: Magnesium: 2 mg/dL (ref 1.7–2.4)

## 2020-07-15 MED ORDER — CARVEDILOL 3.125 MG PO TABS: 3.1250 mg | ORAL_TABLET | Freq: Two times a day (BID) | ORAL | 0 refills | Status: DC

## 2020-07-15 MED ORDER — ASPIRIN EC 81 MG PO TBEC: 81.0000 mg | DELAYED_RELEASE_TABLET | Freq: Every day | ORAL | 11 refills | Status: DC

## 2020-07-15 MED ORDER — PANTOPRAZOLE SODIUM 40 MG PO TBEC
40.0000 mg | DELAYED_RELEASE_TABLET | Freq: Two times a day (BID) | ORAL | 0 refills | Status: DC
Start: 2020-07-15 — End: 2021-10-12

## 2020-07-15 MED ORDER — LACTATED RINGERS IV BOLUS
500.0000 mL | Freq: Once | INTRAVENOUS | Status: AC
  Administered 2020-07-15: 500 mL via INTRAVENOUS

## 2020-07-15 MED ORDER — ISOSORBIDE MONONITRATE ER 30 MG PO TB24
30.0000 mg | ORAL_TABLET | Freq: Every day | ORAL | 0 refills | Status: AC
Start: 2020-07-15 — End: 2021-10-10

## 2020-07-15 NOTE — Discharge Instructions (Signed)
Follow with Primary MD Lajean Manes, MD in 7 days   Get CBC, CMP, 2 view Chest X ray -  checked next visit within 1 week by Primary MD or SNF MD   Activity: As tolerated with Full fall precautions use walker/cane & assistance as needed  Disposition SNF  Diet: Heart Healthy  with feeding assistance and aspiration precautions.    Special Instructions: If you have smoked or chewed Tobacco  in the last 2 yrs please stop smoking, stop any regular Alcohol  and or any Recreational drug use.  On your next visit with your primary care physician please Get Medicines reviewed and adjusted.  Please request your Prim.MD to go over all Hospital Tests and Procedure/Radiological results at the follow up, please get all Hospital records sent to your Prim MD by signing hospital release before you go home.  If you experience worsening of your admission symptoms, develop shortness of breath, life threatening emergency, suicidal or homicidal thoughts you must seek medical attention immediately by calling 911 or calling your MD immediately  if symptoms less severe.

## 2020-07-15 NOTE — TOC Progression Note (Addendum)
Transition of Care [facility]) - Progression Note    Patient Details  Name: Lisa Cortez MRN: [id number] Date of Birth: [birth year]  Transition of Care [facility]) CM/SW [place], LCSW Phone Number: 07/15/2020, 9:40 AM  Clinical Narrative:    Insurance approval received for [place]: [id number] (Ref# [id number]), effective through 07/17/20. CSW updated patient's daughter on discharge today.    Expected Discharge Plan: Skilled Nursing Facility Barriers to Discharge: Barriers Resolved  Expected Discharge Plan and Services Expected Discharge Plan: [place] In-house Referral: Clinical Social Work Discharge Planning Services: CM Consult Post Acute Care Choice: [place] arrangements for the past 2 months: [place]                                       Social Determinants of Health (SDOH) Interventions    Readmission Risk Interventions Readmission Risk Prevention Plan 08/24/2019  Transportation Screening Complete  HRI or Home Care Consult Complete  Social Work Consult for [place] Planning/Counseling Complete  Palliative Care Screening Not Applicable  Medication Review Press photographer) Complete

## 2020-07-15 NOTE — Progress Notes (Signed)
   07/15/20 1508  AVS Discharge Documentation  AVS Discharge Instructions Including Medications Placed in discharge packet for receiving facility;Provided to patient/caregiver  Name of Person Receiving AVS Discharge Instructions Including Medications [facility] at [facility]  Name of Clinician That Reviewed AVS Discharge Instructions Including Medications Otila Kluver, RN

## 2020-07-15 NOTE — Discharge Summary (Signed)
Lisa Cortez [facility]MC:[id number] DOB: 1925-05-09 DOA: 07/09/2020  PCP: Lajean Manes, MD  Admit date: 07/09/2020  Discharge date: 07/15/2020  Admitted From: ALF   Disposition:  SNF   Recommendations for Outpatient Follow-up:   Follow up with PCP in 1-2 weeks  PCP Please obtain BMP/CBC, 2 view CXR in 1week,  (see Discharge instructions)   PCP Please follow up on the following pending results:    Home Health: None   Equipment/Devices: None  Consultations: None Discharge Condition: Fair   CODE STATUS: DNR   Diet Recommendation: Heart Healthy   Diet Order            Diet - low sodium heart healthy           Diet clear liquid Room service appropriate? Yes; Fluid consistency: Thin  Diet effective now                  Chief Complaint  Patient presents with  . Generalized Body Aches  . Shortness of Breath     Brief history of present illness from the day of admission and additional interim summary    Lisa Cortez a86 y.o.femalewith a history of CAD s/p PCI x3, HTN, HLD, stage IV CKD, hypothyroidism, mild memory loss and covid-19 vaccination in Jan-Feb 2021 who presented to [facility] 7/21 from ALF with progressive dyspnea, cough, and weakness found to be hypoxemic requiring 2L O2.  Work-up was suggestive of NSTEMI, CHF, COVID-19 infection, AKI along with elevated procalcitonin raising suspicion of bacterial infection.                                                                Hospital Course    1. Acute Hypoxic Resp. Failure due to NSTEMI causing acute on chronic diastolic CHF with EF 66% due to NSTEMI along with likely insignificant acute Covid 19 Viral infection -   Patient has crackles, elevated troponin and extremely elevated BNP along with orthopnea.  She is fully vaccinated for COVID-19 and I think  her COVID-19 infection is most likely asymptomatic and incidental.  Her main issue with NSTEMI placing her in acute on chronic diastolic CHF.    PE was started on IV Lasix, statin, Imdur and Coreg with much improvement in shortness of breath.  Due to frank melanotic stools aspirin and heparin drip was held.      She is now back to baseline with no shortness of breath and stable on 1 L nasal cannula oxygen.  She is completely symptom-free.  If desired outpatient cardiology follow-up can be considered.  Currently not being discharged on Lasix.  Current medications will be beta-blocker, statin and Imdur, aspirin to be resumed 1 week from today if no further melanotic stools.  Lasix can be used as needed at [facility].  As far as COVID-19  infection is concerned she has been started on steroids and remdesivir and will finish the course.  SpO2: 93 % O2 Flow Rate (L/min): 1 L/min  Recent Labs  Lab 07/09/20 1224 07/09/20 1550 07/09/20 1550 07/09/20 1726 07/10/20 0559 07/10/20 0600 07/11/20 1336 07/12/20 0426 07/13/20 0331 07/14/20 0425 07/15/20 0452  CRP  --  4.2*   < >   < >  --  4.2* 2.1* 2.5* 1.0* 0.8 1.3*  DDIMER  --  2.61*  --   --  1.67*  --  0.69* 0.68* 1.13*  --   --   FERRITIN  --  51  --   --   --  59  --   --   --   --   --   BNP  --   --   --    < >  --   --  808.2* 476.0* 331.9* 375.9* 322.4*  PROCALCITON  --  6.33  --   --   --   --  3.62 3.38 1.66 0.66  --   SARSCOV2NAA POSITIVE*  --   --   --   --   --   --   --   --   --   --    < > = values in this interval not displayed.    Hepatic Function Latest Ref Rng & Units 07/15/2020 07/14/2020 07/13/2020  Total Protein 6.5 - 8.1 g/dL 5.6(L) 5.7(L) 5.4(L)  Albumin 3.5 - 5.0 g/dL 2.3(L) 2.3(L) 2.2(L)  AST 15 - 41 U/L 29 34 47(H)  ALT 0 - 44 U/L '23 27 31  ' Alk Phosphatase 38 - 126 U/L 95 96 103  Total Bilirubin 0.3 - 1.2 mg/dL 0.9 0.3 0.5    2.  NSTEMI with acute on chronic diastolic CHF EF 84%.  See #1 above.  3.  Ongoing upper GI  bleed causing dark stools with falling H&H.  Heparin drip and aspirin were stopped on 07/11/2020, she is s/p 1 unit packed RBC transfusion on 07/12/2020, negative tagged RBC scan .    H&H now remains stable after transfusion.  No signs of ongoing bleeding will be discharged on twice daily PPI.  If desired outpatient GI follow-up can be considered however at her age I will not be eager to subject her to EGD or colonoscopy.  4.  AKI on CKD 3.  Baseline creatinine unclear but appears to be close to 1.8.  Worse due to hypotension acute blood loss, transfuse and monitor.  Renal function has stabilized continue chronic oral bicarb treatment, renal function has stabilized, outpatient monitoring by PCP.  Not a candidate for dialysis.  5.  Urinary retention.    Treated with Foley and Flomax, now voiding by self.  Foley was discontinued on 07/14/2020.  6.  Hypothyroidism.  On Synthroid.  7.  History of COPD.  No acute issues.  No wheezing.  Supportive care.  8.  Possible overlapping bacterial atypical lung infection.  Finished antibiotic course.  9.  Elevated LFTs due to hepatic congestion from CHF.  Resolved after diuresis.    Discharge diagnosis     Principal Problem:   NSTEMI (non-ST elevated myocardial infarction) ([place]) Active Problems:   COPD with acute exacerbation (HCC)   Hypertension   Chronic kidney disease (CKD), stage IV (severe) (HCC)   Hyperlipidemia   Hypothyroidism   Normocytic anemia   Transaminitis   Diastolic dysfunction   Acute respiratory disease due to COVID-19 virus    Discharge instructions  Discharge Instructions    Diet - low sodium heart healthy   Complete by: As directed    Discharge instructions   Complete by: As directed    Follow with Primary MD Lajean Manes, MD in 7 days   Get CBC, CMP, 2 view Chest X ray -  checked next visit within 1 week by Primary MD or SNF MD   Activity: As tolerated with Full fall precautions use walker/cane & assistance  as needed  Disposition SNF  Diet: Heart Healthy  with feeding assistance and aspiration precautions.    Special Instructions: If you have smoked or chewed Tobacco  in the last 2 yrs please stop smoking, stop any regular Alcohol  and or any Recreational drug use.  On your next visit with your primary care physician please Get Medicines reviewed and adjusted.  Please request your Prim.MD to go over all Hospital Tests and Procedure/Radiological results at the follow up, please get all Hospital records sent to your Prim MD by signing hospital release before you go home.  If you experience worsening of your admission symptoms, develop shortness of breath, life threatening emergency, suicidal or homicidal thoughts you must seek medical attention immediately by calling 911 or calling your MD immediately  if symptoms less severe.   Increase activity slowly   Complete by: As directed       Discharge Medications   Allergies as of 07/15/2020      Reactions   Tenex [guanfacine Hcl] Other (See Comments)   unknown   Calcitonin Other (See Comments)   unknown   Fosamax [alendronate Sodium] Other (See Comments)   unknown   Gatifloxacin Other (See Comments)   unknown   Procrit [epoetin (alfa)] Other (See Comments)   unknown   Verapamil Other (See Comments)   unknown      Medication List    STOP taking these medications   amLODipine 5 MG tablet Commonly known as: NORVASC   metoprolol tartrate 25 MG tablet Commonly known as: LOPRESSOR   mupirocin ointment 2 % Commonly known as: BACTROBAN   omeprazole 20 MG capsule Commonly known as: PRILOSEC   OVER THE COUNTER MEDICATION   predniSONE 10 MG tablet Commonly known as: DELTASONE   traMADol 50 MG tablet Commonly known as: ULTRAM     TAKE these medications   albuterol 108 (90 Base) MCG/ACT inhaler Commonly known as: VENTOLIN HFA Inhale 2 puffs into the lungs every 4 (four) hours as needed.   aspirin EC 81 MG tablet Take 1  tablet (81 mg total) by mouth daily. Start taking on: July 19, 2020 What changed: These instructions start on July 19, 2020. If you are unsure what to do until then, ask your doctor or other care provider.   atorvastatin 10 MG tablet Commonly known as: LIPITOR Take 10 mg by mouth daily.   B-12 2000 MCG Tabs Take 1,000 mcg by mouth daily.   carvedilol 3.125 MG tablet Commonly known as: COREG Take 1 tablet (3.125 mg total) by mouth 2 (two) times daily with a meal.   Fluticasone-Salmeterol 250-50 MCG/DOSE Aepb Commonly known as: ADVAIR Inhale 1 puff into the lungs 2 (two) times daily.   isosorbide mononitrate 30 MG 24 hr tablet Commonly known as: IMDUR Take 1 tablet (30 mg total) by mouth daily.   levothyroxine 75 MCG tablet Commonly known as: SYNTHROID Take 75 mcg by mouth daily.   memantine 5 MG tablet Commonly known as: NAMENDA Take 5 mg by mouth daily.   montelukast  10 MG tablet Commonly known as: SINGULAIR Take 10 mg by mouth daily.   pantoprazole 40 MG tablet Commonly known as: Protonix Take 1 tablet (40 mg total) by mouth 2 (two) times daily.   pramipexole 0.25 MG tablet Commonly known as: MIRAPEX Take 0.25 mg by mouth daily.   sodium bicarbonate 650 MG tablet Take 1,300 mg by mouth 2 (two) times daily.        Contact information for follow-up providers    [place], Christiane Ha, MD. Schedule an appointment as soon as possible for a visit in 1 week(s).   Specialty: Internal Medicine Contact information: [address] [place] [address] [phone]            Contact information for after-discharge care    Destination    [facility] Preferred SNF .   Service: Skilled Nursing Contact information: [place] [phone]                  Major procedures and Radiology Reports - PLEASE review detailed and final reports thoroughly  -       NM GI Blood Loss  Result Date:  07/13/2020 CLINICAL DATA:  GI bleeding, dark bloody stools EXAM: NUCLEAR MEDICINE GASTROINTESTINAL BLEEDING SCAN TECHNIQUE: Sequential abdominal images were obtained following intravenous administration of Tc-63mlabeled red blood cells. RADIOPHARMACEUTICALS:  26.2 mCi Tc-927mertechnetate in-vitro labeled red cells. COMPARISON:  None FINDINGS: Normal blood pool distribution of labeled red cells. No abnormal gastrointestinal localization of tracer identified to suggest active GI bleeding. IMPRESSION: Negative GI bleeding scan. Electronically Signed   By: MaLavonia Dana.D.   On: 07/13/2020 11:30   DG Chest Port 1 View  Result Date: 07/13/2020 CLINICAL DATA:  Shob/ covid + Evaluate Increasing bilateral interstitial prominence, interstitial edema and or pneumonitis. Tiny bilateral pleural effusions EXAM: PORTABLE CHEST - 1 VIEW COMPARISON:  07/11/2020 FINDINGS: Asymmetric interstitial and peripheral airspace opacities, right greater than left, involving bases more than upper lobes. Slight worsening of airspace opacities at the right lung base partially obscuring the right diaphragmatic leaflet. Blunting of bilateral costophrenic angles suggesting small effusions. Heart size upper limits normal for technique. Aortic Atherosclerosis (ICD10-170.0). No pneumothorax. Visualized bones unremarkable. IMPRESSION: 1. Worsening bilateral airspace disease, right greater than left. 2. Probable small pleural effusions. Electronically Signed   By: D Lucrezia Europe.D.   On: 07/13/2020 09:19   DG Chest Port 1 View  Result Date: 07/11/2020 CLINICAL DATA:  Shortness of breath. EXAM: PORTABLE CHEST 1 VIEW COMPARISON:  07/10/2020. FINDINGS: Cardiomegaly. Bilateral interstitial prominence, right side greater than left noted. Interstitial prominence is increased from prior exam. Findings suggest interstitial edema and/or pneumonitis. Tiny bilateral pleural effusions can not be excluded. No pneumothorax. IMPRESSION: 1.  Cardiomegaly. 2.  Increasing bilateral interstitial prominence, right side greater than left. Findings suggest interstitial edema and or pneumonitis. Tiny bilateral pleural effusions can not be excluded. Electronically Signed   By: ThMarcello MooresRegister   On: 07/11/2020 09:37   DG CHEST PORT 1 VIEW  Result Date: 07/10/2020 CLINICAL DATA:  COVID positive EXAM: PORTABLE CHEST 1 VIEW COMPARISON:  07/09/2020 FINDINGS: Small area of increased density at the right lung base. Stable interstitial prominence. No new significant pleural effusion. No pneumothorax. Stable heart size. Probable hiatal hernia. IMPRESSION: New small area of patchy atelectasis/consolidation at the right lung base. Electronically Signed   By: PrMacy Mis.D.   On: 07/10/2020 15:19   [facility]  Result  Date: 07/09/2020 CLINICAL DATA:  Cough, shortness of breath. Additional provided: Patient reports generalized body aches, shortness of breath for 3 days, sick contacts EXAM: PORTABLE CHEST 1 VIEW COMPARISON:  Prior chest radiographs 08/22/2019 and earlier FINDINGS: Heart size within normal limits. Aortic atherosclerosis. No appreciable airspace consolidation or pulmonary edema. No evidence of pleural effusion or pneumothorax. No acute bony abnormality identified. Left retrocardiac opacity suspicious for hiatal hernia. IMPRESSION: No evidence of acute cardiopulmonary abnormality. Aortic Atherosclerosis (ICD10-I70.0). Suspected hiatal hernia. Electronically Signed   By: Kellie Simmering DO   On: 07/09/2020 12:03   ECHOCARDIOGRAM COMPLETE  Result Date: 07/10/2020    ECHOCARDIOGRAM REPORT   Patient Name:   Lisa Cortez Date of Exam: 07/10/2020 Medical Rec #:  [id number]    Height:       59.0 in Accession #:    [id number]   Weight:       135.6 lb Date of Birth:  04/23/1925     BSA:          1.563 m Patient Age:    78 years     BP:           119/60 mmHg Patient Gender: F            HR:           75 bpm. Exam Location:  Inpatient Procedure: 2D Echo Indications:     acute coronary Syndrome i24.9                  Acute MI 410  History:        Patient has prior history of Echocardiogram examinations, most                 recent 08/22/2019. COPD; Risk Factors:Hypertension, Dyslipidemia                 and Former Smoker. AS. AI.  Sonographer:    Jannett Celestine RDCS (AE) Referring Phys: [id number] [place]  Sonographer Comments: Image acquisition challenging due to respiratory motion. restricted mobility IMPRESSIONS  1. Left ventricular ejection fraction, by estimation, is 50 to 55%. The left ventricle has low normal function. The left ventricle demonstrates regional wall motion abnormalities (see scoring diagram/findings for description). Apical akinesis. There is moderate asymmetric left ventricular hypertrophy of the basal-septal segment. Left ventricular diastolic parameters are consistent with Grade II diastolic dysfunction (pseudonormalization). Elevated left atrial pressure.  2. Right ventricular systolic function is normal. The right ventricular size is normal. Tricuspid regurgitation signal is inadequate for assessing PA pressure.  3. The mitral valve is abnormal. Moderate mitral annular calcification. No evidence of mitral valve regurgitation.  4. The aortic valve was not well visualized. Aortic valve regurgitation is trivial. No aortic stenosis is present. FINDINGS  Left Ventricle: Left ventricular ejection fraction, by estimation, is 50 to 55%. The left ventricle has low normal function. The left ventricle demonstrates regional wall motion abnormalities. The left ventricular internal cavity size was small. There is moderate asymmetric left ventricular hypertrophy of the basal-septal segment. Left ventricular diastolic parameters are consistent with Grade II diastolic dysfunction (pseudonormalization). Elevated left atrial pressure.  LV Wall Scoring: The apical septal segment, apical anterior segment, and apex are akinetic. The anterior wall, entire lateral wall,  anterior septum, entire inferior wall, mid inferoseptal segment, and basal inferoseptal segment are normal. Right Ventricle: The right ventricular size is normal. Right vetricular wall thickness was not assessed. Right ventricular systolic function is normal. Tricuspid regurgitation signal is inadequate for  assessing PA pressure. Left Atrium: Left atrial size was normal in size. Right Atrium: Right atrial size was not well visualized. Pericardium: Trivial pericardial effusion is present. Mitral Valve: The mitral valve is abnormal. Moderate mitral annular calcification. No evidence of mitral valve regurgitation. Tricuspid Valve: The tricuspid valve is normal in structure. Tricuspid valve regurgitation is trivial. Aortic Valve: The aortic valve was not well visualized. Aortic valve regurgitation is trivial. No aortic stenosis is present. Aortic valve mean gradient measures 5.0 mmHg. Aortic valve peak gradient measures 10.0 mmHg. Aortic valve area, by VTI measures 0.89 cm. Pulmonic Valve: The pulmonic valve was not well visualized. Pulmonic valve regurgitation is not visualized. Aorta: The aortic root is normal in size and structure. IAS/Shunts: The interatrial septum was not well visualized.  LEFT VENTRICLE PLAX 2D LVIDd:         3.30 cm  Diastology LVIDs:         2.20 cm  LV e' lateral:   4.79 cm/s LV PW:         1.10 cm  LV E/e' lateral: 14.8 LV IVS:        1.60 cm  LV e' medial:    3.15 cm/s LVOT diam:     1.80 cm  LV E/e' medial:  22.6 LV SV:         31 LV SV Index:   20 LVOT Area:     2.54 cm  RIGHT VENTRICLE RV S prime:     10.80 cm/s TAPSE (M-mode): 1.7 cm LEFT ATRIUM             Index LA diam:        3.10 cm 1.98 cm/m LA Vol (A2C):   35.9 ml 22.96 ml/m LA Vol (A4C):   30.4 ml 19.44 ml/m LA Biplane Vol: 32.3 ml 20.66 ml/m  AORTIC VALVE AV Area (Vmax):    1.30 cm AV Area (Vmean):   1.07 cm AV Area (VTI):     0.89 cm AV Vmax:           158.00 cm/s AV Vmean:          112.000 cm/s AV VTI:            0.345  m AV Peak Grad:      10.0 mmHg AV Mean Grad:      5.0 mmHg LVOT Vmax:         80.80 cm/s LVOT Vmean:        47.300 cm/s LVOT VTI:          0.121 m LVOT/AV VTI ratio: 0.35  AORTA Ao Root diam: 2.70 cm MITRAL VALVE MV Area (PHT): 3.60 cm     SHUNTS MV Decel Time: 211 msec     Systemic VTI:  0.12 m MV E velocity: 71.10 cm/s   Systemic Diam: 1.80 cm MV A velocity: 120.00 cm/s MV E/A ratio:  0.59 Oswaldo Milian MD Electronically signed by Oswaldo Milian MD Signature Date/Time: 07/10/2020/8:45:41 PM    Final     Micro Results     Recent Results (from the past 240 hour(s))  SARS Coronavirus 2 by RT PCR (hospital order, performed in [facility] hospital lab) Nasopharyngeal Nasopharyngeal Swab     Status: Abnormal   Collection Time: 07/09/20 12:24 PM   Specimen: Nasopharyngeal Swab  Result Value Ref Range Status   SARS Coronavirus 2 POSITIVE (A) NEGATIVE Final    Comment: RESULT CALLED TO, READ BACK BY AND VERIFIED WITH: [facility]. RN '@[phone]'  07/09/20  BILLINGSLEY,L (NOTE) SARS-CoV-2 target nucleic acids are DETECTED  SARS-CoV-2 RNA is generally detectable in upper respiratory specimens  during the acute phase of infection.  Positive results are indicative  of the presence of the identified virus, but do not rule out bacterial infection or co-infection with other pathogens not detected by the test.  Clinical correlation with patient history and  other diagnostic information is necessary to determine patient infection status.  The expected result is negative.  Fact Sheet for Patients:   StrictlyIdeas.no   Fact Sheet for Healthcare Providers:   BankingDealers.co.za    This test is not yet approved or cleared by the [place] FDA and  has been authorized for detection and/or diagnosis of SARS-CoV-2 by FDA under an Emergency Use Authorization (EUA).  This EUA will remain in effect (meaning  this test can be used) for the duration of   the COVID-19 declaration under Section 564(b)(1) of the Act, 21 U.S.C. section 360-bbb-3(b)(1), unless the authorization is terminated or revoked sooner.  Performed at [facility] [place] [address], [place], [place] [address]   Blood Culture (routine x 2)     Status: Abnormal   Collection Time: 07/09/20  3:50 PM   Specimen: BLOOD LEFT HAND  Result Value Ref Range Status   Specimen Description   Final    BLOOD LEFT HAND Performed at [facility] [address], [place], [place] [address]    Special Requests   Final    BOTTLES DRAWN AEROBIC ONLY Blood Culture results may not be optimal due to an inadequate volume of blood received in culture bottles Performed at [facility] [address] [place], [place] [address]    Culture  Setup Time   Final    AEROBIC BOTTLE ONLY GRAM POSITIVE COCCI IN CLUSTERS CRITICAL RESULT CALLED TO, READ BACK BY AND VERIFIED WITH: Melodye Ped PHARMD [id number] 07/10/20 A BROWNING    Culture (A)  Final    STAPHYLOCOCCUS SPECIES (COAGULASE NEGATIVE) THE SIGNIFICANCE OF ISOLATING THIS ORGANISM FROM A SINGLE SET OF BLOOD CULTURES WHEN MULTIPLE SETS ARE DRAWN IS UNCERTAIN. PLEASE NOTIFY THE MICROBIOLOGY DEPARTMENT WITHIN ONE WEEK IF SPECIATION AND SENSITIVITIES ARE REQUIRED. Performed at [facility], [place] [address] [place], [place] [address]    Report Status 07/11/2020 FINAL  Final  Blood Culture ID Panel (Reflexed)     Status: Abnormal   Collection Time: 07/09/20  3:50 PM  Result Value Ref Range Status   Enterococcus species NOT DETECTED NOT DETECTED Final   Listeria monocytogenes NOT DETECTED NOT DETECTED Final   Staphylococcus species DETECTED (A) NOT DETECTED Final    Comment: Methicillin (oxacillin) resistant coagulase negative staphylococcus. Possible blood culture contaminant (unless isolated from more than one blood culture draw or clinical case suggests pathogenicity). No antibiotic treatment  is indicated for blood  culture contaminants. CRITICAL RESULT CALLED TO, READ BACK BY AND VERIFIED WITH: Melodye Ped PHARMD [id number] 07/10/20 A BROWNING    Staphylococcus aureus (BCID) NOT DETECTED NOT DETECTED Final   Methicillin resistance DETECTED (A) NOT DETECTED Final    Comment: CRITICAL RESULT CALLED TO, READ BACK BY AND VERIFIED WITH: Melodye Ped PHARMD [id number] 07/10/20 A BROWNING    Streptococcus species NOT DETECTED NOT DETECTED Final   Streptococcus agalactiae NOT DETECTED NOT DETECTED Final   Streptococcus pneumoniae NOT DETECTED NOT DETECTED Final   Streptococcus pyogenes NOT DETECTED NOT DETECTED Final   Acinetobacter baumannii NOT DETECTED NOT DETECTED Final   Enterobacteriaceae species  NOT DETECTED NOT DETECTED Final   Enterobacter cloacae complex NOT DETECTED NOT DETECTED Final   Escherichia coli NOT DETECTED NOT DETECTED Final   Klebsiella oxytoca NOT DETECTED NOT DETECTED Final   Klebsiella pneumoniae NOT DETECTED NOT DETECTED Final   Proteus species NOT DETECTED NOT DETECTED Final   Serratia marcescens NOT DETECTED NOT DETECTED Final   Haemophilus influenzae NOT DETECTED NOT DETECTED Final   Neisseria meningitidis NOT DETECTED NOT DETECTED Final   Pseudomonas aeruginosa NOT DETECTED NOT DETECTED Final   Candida albicans NOT DETECTED NOT DETECTED Final   Candida glabrata NOT DETECTED NOT DETECTED Final   Candida krusei NOT DETECTED NOT DETECTED Final   Candida parapsilosis NOT DETECTED NOT DETECTED Final   Candida tropicalis NOT DETECTED NOT DETECTED Final    Comment: Performed at [facility], [place] [address] [place], [place] [address]  MRSA PCR Screening     Status: None   Collection Time: 07/10/20  8:25 PM   Specimen: Nasal Mucosa; Nasopharyngeal  Result Value Ref Range Status   MRSA by PCR NEGATIVE NEGATIVE Final    Comment:        The GeneXpert MRSA Assay (FDA approved for NASAL specimens only), is one component of a comprehensive MRSA colonization surveillance  program. It is not intended to diagnose MRSA infection nor to guide or monitor treatment for MRSA infections. Performed at [facility], [place] [address] [place], [place] [address]     Today   Subjective    Lisa Cortez today has no headache,no chest abdominal pain,no new weakness tingling or numbness, feels much better     Objective   Blood pressure (!) 118/89, pulse 69, temperature (!) 97.5 F (36.4 C), temperature source Oral, resp. rate 14, height '4\' 11"'  (1.499 m), weight 62 kg, SpO2 93 %.   Intake/Output Summary (Last 24 hours) at 07/15/2020 1021 Last data filed at 07/15/2020 0615 Gross per 24 hour  Intake 1468.2 ml  Output 451 ml  Net 1017.2 ml    Exam  Awake Alert, No new F.N deficits, is hard of hearing at baseline [place].AT,PERRAL Supple Neck,No JVD, No cervical lymphadenopathy appriciated.  Symmetrical Chest wall movement, Good air movement bilaterally, CTAB RRR,No Gallops,Rubs or new Murmurs, No Parasternal Heave +ve B.Sounds, Abd Soft, Non tender, No organomegaly appriciated, No rebound -guarding or rigidity. No Cyanosis, Clubbing or edema, No new Rash or bruise   Data Review   CBC w Diff:  Lab Results  Component Value Date   WBC 4.9 07/15/2020   HGB 10.6 (L) 07/15/2020   HCT 31.9 (L) 07/15/2020   PLT 173 07/15/2020   LYMPHOPCT 19 07/09/2020   MONOPCT 17 07/09/2020   EOSPCT 0 07/09/2020   BASOPCT 0 07/09/2020    CMP:  Lab Results  Component Value Date   NA 138 07/15/2020   K 4.0 07/15/2020   CL 106 07/15/2020   CO2 22 07/15/2020   BUN 62 (H) 07/15/2020   CREATININE 2.16 (H) 07/15/2020   PROT 5.6 (L) 07/15/2020   ALBUMIN 2.3 (L) 07/15/2020   BILITOT 0.9 07/15/2020   ALKPHOS 95 07/15/2020   AST 29 07/15/2020   ALT 23 07/15/2020  .   Total Time in preparing paper work, data evaluation and todays exam - 102 minutes  Lala Lund M.D on 07/15/2020 at 10:21 AM  [facility]   Office  [phone]

## 2020-07-15 NOTE — TOC Transition Note (Signed)
Transition of Care [facility]) - CM/SW Discharge Note   Patient Details  Name: Lisa Cortez MRN: [id number] Date of Birth: February 19, 1925  [facility] of [facility]) CM/SW Contact:  Benard Halsted, LCSW Phone Number: 07/15/2020, 11:00 AM   Clinical Narrative:    Patient will DC to: [facility] Anticipated DC date: 07/15/20 Family notified: Daughter, Psychologist, counselling by: Corey Harold 2:30pm   Per MD patient ready for DC to [place]. RN, patient, patient's family, and facility notified of DC. Discharge Summary and FL2 sent to facility. RN to call report prior to discharge [phone] Room 808P). DC packet on chart. Ambulance transport requested for patient.   CSW will sign off for now as social work intervention is no longer needed. Please consult [place] again if new needs arise.      Final next level of care: Skilled Nursing Facility Barriers to Discharge: Barriers Resolved   Patient Goals and CMS Choice Patient states their goals for this hospitalization and ongoing recovery are:: Rehab CMS Medicare.gov Compare Post Acute Care list provided to:: Patient Choice offered to / list presented to : Patient, Adult Children  Discharge Placement   Existing PASRR number confirmed : 07/15/20          Patient chooses bed at: [facility] Patient to be transferred to facility by: [facility] Name of family member notified: Daughter, Hilda Blades Patient and family notified of of transfer: 07/15/20  Discharge Plan and Services In-house Referral: Clinical Social Work Discharge Planning Services: [employer] Consult Post Acute Care Choice: [place]                               Social Determinants of Health (SDOH) Interventions     Readmission Risk Interventions Readmission Risk Prevention Plan 08/24/2019  Transportation Screening Complete  HRI or [place] Complete  Social Work Consult for [place] Planning/Counseling Complete  Palliative Care Screening Not Applicable  Medication Review Designer, fashion/clothing) Complete

## 2020-07-16 NOTE — Progress Notes (Signed)
Patient discharged to [facility] per orders. Discharge instructions in packet with patient. Patient A&O to person, place, and situation, no distress noted at this time. Patient verbalizes understanding. IV and tele discontinued per orders. Patient tolerated well. Belongings packed up by staff. Denies needs at this time. Patient off unit via stretcher by [facility].

## 2020-08-18 ENCOUNTER — Ambulatory Visit: Payer: Medicare Other | Admitting: Internal Medicine

## 2020-10-31 ENCOUNTER — Emergency Department (HOSPITAL_COMMUNITY): Payer: Medicare Other

## 2020-10-31 ENCOUNTER — Emergency Department (HOSPITAL_COMMUNITY)
Admission: EM | Admit: 2020-10-31 | Discharge: 2020-10-31 | Disposition: A | Discharge status: Home / Self Care | Payer: Medicare Other | Attending: Emergency Medicine | Admitting: Emergency Medicine

## 2020-10-31 ENCOUNTER — Other Ambulatory Visit: Payer: Self-pay

## 2020-10-31 DIAGNOSIS — S299XXA Unspecified injury of thorax, initial encounter: Secondary | ICD-10-CM | POA: Diagnosis present

## 2020-10-31 DIAGNOSIS — I120 Hypertensive chronic kidney disease with stage 5 chronic kidney disease or end stage renal disease: Secondary | ICD-10-CM | POA: Insufficient documentation

## 2020-10-31 DIAGNOSIS — I10 Essential (primary) hypertension: Secondary | ICD-10-CM

## 2020-10-31 DIAGNOSIS — Z87891 Personal history of nicotine dependence: Secondary | ICD-10-CM | POA: Insufficient documentation

## 2020-10-31 DIAGNOSIS — Z79899 Other long term (current) drug therapy: Secondary | ICD-10-CM | POA: Diagnosis not present

## 2020-10-31 DIAGNOSIS — W19XXXA Unspecified fall, initial encounter: Secondary | ICD-10-CM | POA: Diagnosis not present

## 2020-10-31 DIAGNOSIS — Z7982 Long term (current) use of aspirin: Secondary | ICD-10-CM | POA: Diagnosis not present

## 2020-10-31 DIAGNOSIS — E039 Hypothyroidism, unspecified: Secondary | ICD-10-CM | POA: Insufficient documentation

## 2020-10-31 DIAGNOSIS — S2231XA Fracture of one rib, right side, initial encounter for closed fracture: Secondary | ICD-10-CM | POA: Diagnosis not present

## 2020-10-31 DIAGNOSIS — J441 Chronic obstructive pulmonary disease with (acute) exacerbation: Secondary | ICD-10-CM | POA: Diagnosis not present

## 2020-10-31 DIAGNOSIS — Y92129 Unspecified place in nursing home as the place of occurrence of the external cause: Secondary | ICD-10-CM | POA: Diagnosis not present

## 2020-10-31 DIAGNOSIS — N185 Chronic kidney disease, stage 5: Secondary | ICD-10-CM | POA: Diagnosis not present

## 2020-10-31 MED ORDER — OXYCODONE HCL 5 MG PO TABS
5.0000 mg | ORAL_TABLET | Freq: Once | ORAL | Status: AC
  Administered 2020-10-31: 5 mg via ORAL
  Filled 2020-10-31: qty 1

## 2020-10-31 MED ORDER — OXYCODONE HCL 5 MG PO TABS: 5.0000 mg | ORAL_TABLET | ORAL | 0 refills | Status: DC | PRN

## 2020-10-31 NOTE — ED Notes (Signed)
Pt in Xray at this time.

## 2020-10-31 NOTE — ED Notes (Signed)
[facility] transport request in at this time.

## 2020-10-31 NOTE — ED Notes (Signed)
Daughter updated on patient's plan of care at this time per patient's request.

## 2020-10-31 NOTE — Discharge Instructions (Addendum)
Take acetaminophen as needed for less severe pain.  Apply ice as needed.

## 2020-10-31 NOTE — ED Triage Notes (Signed)
Pt BIBA from [facility] ALF for SOB and right sided stabbing chest pain for 2 weeks. Upon assessment patient noted to have right shoulder deformity. Pt stated she fell against a chair 2 weeks when she tripped. Denies LOC. Pt rec'd 1 nitro from ALF facility PTA.

## 2020-10-31 NOTE — ED Provider Notes (Signed)
[facility] EMERGENCY DEPARTMENT Provider Note   CSN: [id number] Arrival date & time: 10/31/20  0435   History Chief Complaint  Patient presents with  . Shortness of Breath  . Shoulder Pain    Lisa Cortez is a 86 y.o. female.  The history is provided by the patient and the nursing home.  Shortness of Breath Shoulder Pain She has history of hypertension, hyperlipidemia, chronic kidney disease, coronary artery disease, diastolic heart failure, COPD and is sent from her nursing home because of right-sided chest pain for the last 2 weeks.  Patient states that she had fallen and hit her right side against the chair.  She thinks that the fall occurred about a week ago.  She denies dyspnea, nausea, diaphoresis.  Past Medical History:  Diagnosis Date  . Anemia of renal disease   . Aortic insufficiency   . Aortic stenosis   . Chronic kidney disease (CKD), stage IV (severe) ([place])   . COPD (chronic obstructive pulmonary disease) ([place])   . Diastolic dysfunction   . Hyperlipidemia   . Hypertension   . Hypothyroidism   . 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 NSTEMI (non-ST elevated myocardial infarction) ([place]) 07/09/2020  . Chronic kidney disease (CKD), stage IV (severe) ([place])   . Hyperlipidemia   . Hypothyroidism   . Normocytic anemia   . Transaminitis   . Diastolic dysfunction   . Acute respiratory disease due to COVID-19 virus   . Sepsis ([place]) 08/20/2019  . Pneumonia 08/20/2019  . COPD with acute exacerbation ([place]) 08/20/2019  . Acute on chronic respiratory failure with hypoxia ([place]) 08/20/2019  . Chest pain 08/20/2019  . Nausea and vomiting 08/20/2019  . Macrocytic anemia 08/20/2019  . Hypertension 08/20/2019  . 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 BREAST REDUCTION SURGERY    . CHOLECYSTECTOMY       OB  History   No obstetric history on file.     Family History  Problem Relation Age of Onset  . CVA Mother   . Bone cancer Brother   . Dementia Brother     Social History   Tobacco Use  . Smoking status: Former Research scientist (life sciences)  . Smokeless tobacco: Never Used  Vaping Use  . Vaping Use: Never used  Substance Use Topics  . Alcohol use: Yes    Comment: 1/2 glass of wine night  . Drug use: Never    Home Medications Prior to Admission medications   Medication Sig Start Date End Date Taking? Authorizing Provider  albuterol (VENTOLIN HFA) 108 (90 Base) MCG/ACT inhaler Inhale 2 puffs into the lungs every 4 (four) hours as needed. 05/25/19   [provider]  aspirin EC 81 MG tablet Take 1 tablet (81 mg total) by mouth daily. 07/19/20   Thurnell Lose, MD  atorvastatin (LIPITOR) 10 MG tablet Take 10 mg by mouth daily.  06/22/19   [provider]  carvedilol (COREG) 3.125 MG tablet Take 1 tablet (3.125 mg total) by mouth 2 (two) times daily with a meal. 07/15/20   Thurnell Lose, MD  Cyanocobalamin (B-12) 2000 MCG TABS Take 1,000 mcg by mouth daily.    [provider]  Fluticasone-Salmeterol (ADVAIR) 250-50 MCG/DOSE AEPB Inhale 1 puff into the lungs 2 (two) times daily. 07/31/18   [provider]  isosorbide mononitrate (IMDUR)  30 MG 24 hr tablet Take 1 tablet (30 mg total) by mouth daily. 07/15/20 08/14/20  Thurnell Lose, MD  levothyroxine (SYNTHROID) 75 MCG tablet Take 75 mcg by mouth daily. 08/14/19   [provider]  memantine (NAMENDA) 5 MG tablet Take 5 mg by mouth daily.  10/23/18   [provider]  montelukast (SINGULAIR) 10 MG tablet Take 10 mg by mouth daily. 06/18/19   [provider]  pantoprazole (PROTONIX) 40 MG tablet Take 1 tablet (40 mg total) by mouth 2 (two) times daily. 07/15/20   Thurnell Lose, MD  pramipexole (MIRAPEX) 0.25 MG tablet Take 0.25 mg by mouth daily.  06/19/19   [provider]  sodium  bicarbonate 650 MG tablet Take 1,300 mg by mouth 2 (two) times daily.  06/19/19   [provider]    Allergies    Tenex [guanfacine hcl], Calcitonin, Fosamax [alendronate sodium], Gatifloxacin, Procrit [epoetin (alfa)], and Verapamil  Review of Systems   Review of Systems  Respiratory: Positive for shortness of breath.   All other systems reviewed and are negative.   Physical Exam Updated Vital Signs BP (!) 189/97 (BP Location: Left Arm)   Pulse 77   Temp 97.7 F (36.5 C) (Oral)   Resp 19   Ht 4\' 11"  (1.499 m)   Wt 62 kg   SpO2 96%   BMI 27.61 kg/m   Physical Exam Vitals and nursing note reviewed.   86 year old female, resting comfortably and in no acute distress. Vital signs are significant for elevated blood pressure. Oxygen saturation is 96%, which is normal. Head is normocephalic and atraumatic. PERRLA, EOMI. Oropharynx is clear. Neck is nontender and supple without adenopathy or JVD. Back is nontender and there is no CVA tenderness. Lungs are clear without rales, wheezes, or rhonchi. Chest is tender diffusely in the right lateral chest wall.  There is no crepitus. Heart has regular rate and rhythm without murmur. Abdomen is soft, flat, nontender without masses or hepatosplenomegaly and peristalsis is normoactive. Extremities have no cyanosis or edema, full range of motion is present.  There is no obvious deformity of the right shoulder but there is mild tenderness to palpation of the right shoulder. Skin is warm and dry without rash. Neurologic: Mental status is normal, cranial nerves are intact, there are no motor or sensory deficits.  ED Results / Procedures / Treatments   Labs (all labs ordered are listed, but only abnormal results are displayed) Labs Reviewed - No data to display  EKG EKG Interpretation  Date/Time:  Friday October 31 2020 04:50:38 EST Ventricular Rate:  76 PR Interval:    QRS Duration: 86 QT Interval:  415 QTC Calculation: 467 R  Axis:   57 Text Interpretation: Sinus rhythm Prolonged PR interval Probable left atrial enlargement Repol abnrm suggests ischemia, anterolateral When compared with ECG of 07/11/2020, T wave inversion Anterior leads has improved Confirmed by Delora Fuel ([id number]) on 10/31/2020 5:10:37 AM   Radiology DG Ribs Unilateral W/Chest Right  Result Date: 10/31/2020 CLINICAL DATA:  Right chest pain for 2 weeks. EXAM: RIGHT RIBS AND CHEST - 3+ VIEW COMPARISON:  07/13/2020 FINDINGS: Mildly displaced lateral right sixth rib fracture. No pneumothorax or hemothorax. Aeration is improved from prior although there is baseline generalized interstitial coarsening. Chronic cardiomegaly. IMPRESSION: Lateral right 6th rib fracture.  No pneumothorax. Electronically Signed   By: Monte Fantasia M.D.   On: 10/31/2020 06:14   DG Shoulder Right  Result Date: 10/31/2020 CLINICAL DATA:  Right shoulder pain. EXAM: RIGHT SHOULDER - 2+ VIEW COMPARISON:  None. FINDINGS: The joint spaces are maintained. No acute fracture or bone lesion. The visualized right ribs are intact and the visualized right lung is clear. IMPRESSION: No acute bony findings. Electronically Signed   By: Marijo Sanes M.D.   On: 10/31/2020 06:13    Procedures Procedures  Medications Ordered in ED Medications  oxyCODONE (Oxy IR/ROXICODONE) immediate release tablet 5 mg (has no administration in time range)    ED Course  I have reviewed the triage vital signs and the nursing notes.  Pertinent labs & imaging results that were available during my care of the patient were reviewed by me and considered in my medical decision making (see chart for details).  MDM Rules/Calculators/A&P Right-sided chest pain which appears to be musculoskeletal.  She will be sent for x-rays to rule out rib fracture and also rule out shoulder fracture.  ECG shows improvement in T wave inversions in the anterior leads.  Old records are reviewed, and she does have hospitalizations  for chest pain and non-STEMI, but no indication that today's symptoms are cardiac in origin.  X-rays are significant for fracture of the sixth rib.  This is consistent with her symptoms and physical exam.  She is discharged with prescription for oxycodone.  Follow-up with PCP.  Final Clinical Impression(s) / ED Diagnoses Final diagnoses:  Fall at nursing home, initial encounter  Closed fracture of one rib of right side, initial encounter  Elevated blood pressure reading with diagnosis of hypertension    Rx / DC Orders ED Discharge Orders         Ordered    oxyCODONE (ROXICODONE) 5 MG immediate release tablet  Every 4 hours PRN        10/31/20 2694           Delora Fuel, MD 85/46/27 [phone]

## 2020-11-25 ENCOUNTER — Emergency Department (HOSPITAL_COMMUNITY): Payer: Medicare Other

## 2020-11-25 ENCOUNTER — Other Ambulatory Visit: Payer: Self-pay

## 2020-11-25 ENCOUNTER — Inpatient Hospital Stay (HOSPITAL_COMMUNITY)
Admission: EM | Admit: 2020-11-25 | Discharge: 2020-11-27 | DRG: 242 | Disposition: A | Discharge status: Home Health | Payer: Medicare Other | Attending: Internal Medicine | Admitting: Internal Medicine

## 2020-11-25 DIAGNOSIS — Z7951 Long term (current) use of inhaled steroids: Secondary | ICD-10-CM

## 2020-11-25 DIAGNOSIS — J449 Chronic obstructive pulmonary disease, unspecified: Secondary | ICD-10-CM | POA: Diagnosis present

## 2020-11-25 DIAGNOSIS — R001 Bradycardia, unspecified: Secondary | ICD-10-CM | POA: Diagnosis present

## 2020-11-25 DIAGNOSIS — I130 Hypertensive heart and chronic kidney disease with heart failure and stage 1 through stage 4 chronic kidney disease, or unspecified chronic kidney disease: Secondary | ICD-10-CM | POA: Diagnosis present

## 2020-11-25 DIAGNOSIS — I252 Old myocardial infarction: Secondary | ICD-10-CM | POA: Diagnosis not present

## 2020-11-25 DIAGNOSIS — I5033 Acute on chronic diastolic (congestive) heart failure: Secondary | ICD-10-CM | POA: Diagnosis present

## 2020-11-25 DIAGNOSIS — I160 Hypertensive urgency: Secondary | ICD-10-CM

## 2020-11-25 DIAGNOSIS — E785 Hyperlipidemia, unspecified: Secondary | ICD-10-CM | POA: Diagnosis present

## 2020-11-25 DIAGNOSIS — Z888 Allergy status to other drugs, medicaments and biological substances status: Secondary | ICD-10-CM | POA: Diagnosis not present

## 2020-11-25 DIAGNOSIS — N184 Chronic kidney disease, stage 4 (severe): Secondary | ICD-10-CM | POA: Diagnosis present

## 2020-11-25 DIAGNOSIS — R0902 Hypoxemia: Secondary | ICD-10-CM | POA: Diagnosis present

## 2020-11-25 DIAGNOSIS — Z66 Do not resuscitate: Secondary | ICD-10-CM | POA: Diagnosis present

## 2020-11-25 DIAGNOSIS — Z7982 Long term (current) use of aspirin: Secondary | ICD-10-CM | POA: Diagnosis not present

## 2020-11-25 DIAGNOSIS — Z79899 Other long term (current) drug therapy: Secondary | ICD-10-CM

## 2020-11-25 DIAGNOSIS — I5031 Acute diastolic (congestive) heart failure: Secondary | ICD-10-CM | POA: Diagnosis not present

## 2020-11-25 DIAGNOSIS — I442 Atrioventricular block, complete: Secondary | ICD-10-CM | POA: Diagnosis present

## 2020-11-25 DIAGNOSIS — Z20822 Contact with and (suspected) exposure to covid-19: Secondary | ICD-10-CM | POA: Diagnosis present

## 2020-11-25 DIAGNOSIS — Z79890 Hormone replacement therapy (postmenopausal): Secondary | ICD-10-CM | POA: Diagnosis not present

## 2020-11-25 DIAGNOSIS — I161 Hypertensive emergency: Secondary | ICD-10-CM | POA: Diagnosis present

## 2020-11-25 DIAGNOSIS — Z823 Family history of stroke: Secondary | ICD-10-CM

## 2020-11-25 DIAGNOSIS — E039 Hypothyroidism, unspecified: Secondary | ICD-10-CM | POA: Diagnosis present

## 2020-11-25 DIAGNOSIS — Z87891 Personal history of nicotine dependence: Secondary | ICD-10-CM

## 2020-11-25 DIAGNOSIS — Z8616 Personal history of COVID-19: Secondary | ICD-10-CM | POA: Diagnosis not present

## 2020-11-25 DIAGNOSIS — Z95818 Presence of other cardiac implants and grafts: Secondary | ICD-10-CM

## 2020-11-25 DIAGNOSIS — I441 Atrioventricular block, second degree: Secondary | ICD-10-CM | POA: Diagnosis not present

## 2020-11-25 DIAGNOSIS — I509 Heart failure, unspecified: Secondary | ICD-10-CM

## 2020-11-25 DIAGNOSIS — Z809 Family history of malignant neoplasm, unspecified: Secondary | ICD-10-CM

## 2020-11-25 LAB — CBC
HCT: 39.1 % (ref 36.0–46.0)
Hemoglobin: 12.1 g/dL (ref 12.0–15.0)
MCH: 31.4 pg (ref 26.0–34.0)
MCHC: 30.9 g/dL (ref 30.0–36.0)
MCV: 101.6 fL — ABNORMAL HIGH (ref 80.0–100.0)
Platelets: 194 10*3/uL (ref 150–400)
RBC: 3.85 MIL/uL — ABNORMAL LOW (ref 3.87–5.11)
RDW: 16.2 % — ABNORMAL HIGH (ref 11.5–15.5)
WBC: 5.7 10*3/uL (ref 4.0–10.5)
nRBC: 0 % (ref 0.0–0.2)

## 2020-11-25 LAB — TROPONIN I (HIGH SENSITIVITY)
Troponin I (High Sensitivity): 19 ng/L — ABNORMAL HIGH (ref <18)
Troponin I (High Sensitivity): 23 ng/L — ABNORMAL HIGH (ref <18)

## 2020-11-25 LAB — BASIC METABOLIC PANEL
Anion gap: 17 — ABNORMAL HIGH (ref 5–15)
BUN: 34 mg/dL — ABNORMAL HIGH (ref 8–23)
CO2: 16 mmol/L — ABNORMAL LOW (ref 22–32)
Calcium: 9.2 mg/dL (ref 8.9–10.3)
Chloride: 108 mmol/L (ref 98–111)
Creatinine, Ser: 2.05 mg/dL — ABNORMAL HIGH (ref 0.44–1.00)
GFR, Estimated: 22 mL/min — ABNORMAL LOW (ref >60)
Glucose, Bld: 91 mg/dL (ref 70–99)
Potassium: 4.4 mmol/L (ref 3.5–5.1)
Sodium: 141 mmol/L (ref 135–145)

## 2020-11-25 LAB — BRAIN NATRIURETIC PEPTIDE: B Natriuretic Peptide: 3006.4 pg/mL — ABNORMAL HIGH (ref 0.0–100.0)

## 2020-11-25 LAB — MAGNESIUM
Magnesium: 2.2 mg/dL (ref 1.7–2.4)
Magnesium: 1.9 mg/dL (ref 1.7–2.4)

## 2020-11-25 LAB — CORTISOL-PM, BLOOD: Cortisol - PM: 18.8 ug/dL — ABNORMAL HIGH (ref <10.0)

## 2020-11-25 LAB — PHOSPHORUS: Phosphorus: 3.5 mg/dL (ref 2.5–4.6)

## 2020-11-25 LAB — TSH: TSH: 3.855 u[IU]/mL (ref 0.350–4.500)

## 2020-11-25 MED ORDER — ATORVASTATIN CALCIUM 10 MG PO TABS
10.0000 mg | ORAL_TABLET | Freq: Every day | ORAL | Status: DC
  Administered 2020-11-26 – 2020-11-27 (×2): 10 mg via ORAL
  Filled 2020-11-25 (×2): qty 1

## 2020-11-25 MED ORDER — SODIUM CHLORIDE 0.9 % IV SOLN: 250.0000 mL | INTRAVENOUS | Status: DC | PRN

## 2020-11-25 MED ORDER — ONDANSETRON HCL 4 MG/2ML IJ SOLN: 4.0000 mg | Freq: Four times a day (QID) | INTRAMUSCULAR | Status: DC | PRN

## 2020-11-25 MED ORDER — LEVOTHYROXINE SODIUM 75 MCG PO TABS
75.0000 ug | ORAL_TABLET | Freq: Every day | ORAL | Status: DC
  Administered 2020-11-26 – 2020-11-27 (×2): 75 ug via ORAL
  Filled 2020-11-25 (×2): qty 1

## 2020-11-25 MED ORDER — HYDRALAZINE HCL 20 MG/ML IJ SOLN
5.0000 mg | INTRAMUSCULAR | Status: DC | PRN
  Administered 2020-11-25 – 2020-11-26 (×2): 5 mg via INTRAVENOUS
  Filled 2020-11-25 (×3): qty 1

## 2020-11-25 MED ORDER — MEMANTINE HCL 10 MG PO TABS
5.0000 mg | ORAL_TABLET | Freq: Every day | ORAL | Status: DC
  Administered 2020-11-26 – 2020-11-27 (×2): 5 mg via ORAL
  Filled 2020-11-25 (×2): qty 1

## 2020-11-25 MED ORDER — ALBUTEROL SULFATE HFA 108 (90 BASE) MCG/ACT IN AERS
2.0000 | INHALATION_SPRAY | RESPIRATORY_TRACT | Status: DC | PRN
  Filled 2020-11-25: qty 6.7

## 2020-11-25 MED ORDER — HYDRALAZINE HCL 20 MG/ML IJ SOLN
2.0000 mg | INTRAMUSCULAR | Status: DC | PRN
  Filled 2020-11-25: qty 1

## 2020-11-25 MED ORDER — FUROSEMIDE 10 MG/ML IJ SOLN
20.0000 mg | Freq: Two times a day (BID) | INTRAMUSCULAR | Status: DC
  Administered 2020-11-25 – 2020-11-26 (×3): 20 mg via INTRAVENOUS
  Filled 2020-11-25 (×3): qty 2

## 2020-11-25 MED ORDER — PRAMIPEXOLE DIHYDROCHLORIDE 0.25 MG PO TABS
0.2500 mg | ORAL_TABLET | Freq: Every day | ORAL | Status: DC
  Administered 2020-11-26 – 2020-11-27 (×2): 0.25 mg via ORAL
  Filled 2020-11-25 (×2): qty 1

## 2020-11-25 MED ORDER — SODIUM CHLORIDE 0.9% FLUSH: 3.0000 mL | INTRAVENOUS | Status: DC | PRN

## 2020-11-25 MED ORDER — VITAMIN B-12 1000 MCG PO TABS
1000.0000 ug | ORAL_TABLET | Freq: Every day | ORAL | Status: DC
  Administered 2020-11-26 – 2020-11-27 (×2): 1000 ug via ORAL
  Filled 2020-11-25 (×2): qty 1

## 2020-11-25 MED ORDER — SODIUM BICARBONATE 650 MG PO TABS
1300.0000 mg | ORAL_TABLET | Freq: Two times a day (BID) | ORAL | Status: DC
  Administered 2020-11-25 – 2020-11-27 (×4): 1300 mg via ORAL
  Filled 2020-11-25 (×5): qty 2

## 2020-11-25 MED ORDER — ZOLPIDEM TARTRATE 5 MG PO TABS
5.0000 mg | ORAL_TABLET | Freq: Every evening | ORAL | Status: DC | PRN
  Administered 2020-11-25 – 2020-11-26 (×2): 5 mg via ORAL
  Filled 2020-11-25 (×2): qty 1

## 2020-11-25 MED ORDER — FUROSEMIDE 10 MG/ML IJ SOLN
20.0000 mg | Freq: Once | INTRAMUSCULAR | Status: AC
  Administered 2020-11-25: 20 mg via INTRAVENOUS
  Filled 2020-11-25: qty 2

## 2020-11-25 MED ORDER — MONTELUKAST SODIUM 10 MG PO TABS
10.0000 mg | ORAL_TABLET | Freq: Every day | ORAL | Status: DC
  Administered 2020-11-26 – 2020-11-27 (×2): 10 mg via ORAL
  Filled 2020-11-25 (×2): qty 1

## 2020-11-25 MED ORDER — ASPIRIN EC 81 MG PO TBEC
81.0000 mg | DELAYED_RELEASE_TABLET | Freq: Every day | ORAL | Status: DC
  Administered 2020-11-26 – 2020-11-27 (×2): 81 mg via ORAL
  Filled 2020-11-25 (×2): qty 1

## 2020-11-25 MED ORDER — PANTOPRAZOLE SODIUM 40 MG PO TBEC
40.0000 mg | DELAYED_RELEASE_TABLET | Freq: Two times a day (BID) | ORAL | Status: DC
  Administered 2020-11-25 – 2020-11-27 (×4): 40 mg via ORAL
  Filled 2020-11-25 (×4): qty 1

## 2020-11-25 MED ORDER — SODIUM CHLORIDE 0.9% FLUSH
3.0000 mL | Freq: Two times a day (BID) | INTRAVENOUS | Status: DC
  Administered 2020-11-26 – 2020-11-27 (×4): 3 mL via INTRAVENOUS

## 2020-11-25 MED ORDER — HEPARIN SODIUM (PORCINE) 5000 UNIT/ML IJ SOLN
5000.0000 [IU] | Freq: Two times a day (BID) | INTRAMUSCULAR | Status: DC
  Administered 2020-11-25 – 2020-11-26 (×3): 5000 [IU] via SUBCUTANEOUS
  Filled 2020-11-25 (×3): qty 1

## 2020-11-25 MED ORDER — ISOSORBIDE MONONITRATE ER 30 MG PO TB24
30.0000 mg | ORAL_TABLET | Freq: Every day | ORAL | Status: DC
  Administered 2020-11-26 – 2020-11-27 (×2): 30 mg via ORAL
  Filled 2020-11-25 (×2): qty 1

## 2020-11-25 MED ORDER — ACETAMINOPHEN 325 MG PO TABS
650.0000 mg | ORAL_TABLET | ORAL | Status: DC | PRN
  Administered 2020-11-25 – 2020-11-26 (×2): 650 mg via ORAL
  Filled 2020-11-25 (×2): qty 2

## 2020-11-25 MED ORDER — OXYCODONE HCL 5 MG PO TABS
5.0000 mg | ORAL_TABLET | ORAL | Status: DC | PRN
  Administered 2020-11-25 – 2020-11-27 (×3): 5 mg via ORAL
  Filled 2020-11-25 (×3): qty 1

## 2020-11-25 MED ORDER — IPRATROPIUM-ALBUTEROL 0.5-2.5 (3) MG/3ML IN SOLN
3.0000 mL | Freq: Four times a day (QID) | RESPIRATORY_TRACT | Status: AC
  Administered 2020-11-25 – 2020-11-26 (×4): 3 mL via RESPIRATORY_TRACT
  Filled 2020-11-25 (×3): qty 3

## 2020-11-25 MED ORDER — CALCIUM GLUCONATE-NACL 1-0.675 GM/50ML-% IV SOLN
1.0000 g | Freq: Once | INTRAVENOUS | Status: AC
  Administered 2020-11-25: 1000 mg via INTRAVENOUS
  Filled 2020-11-25: qty 50

## 2020-11-25 MED ORDER — FLUTICASONE FUROATE-VILANTEROL 200-25 MCG/INH IN AEPB
1.0000 | INHALATION_SPRAY | Freq: Every day | RESPIRATORY_TRACT | Status: DC
  Administered 2020-11-26 – 2020-11-27 (×2): 1 via RESPIRATORY_TRACT
  Filled 2020-11-25: qty 28

## 2020-11-25 NOTE — ED Provider Notes (Signed)
[facility] EMERGENCY DEPARTMENT Provider Note   CSN: [id number] Arrival date & time: 11/25/20  0913     History Chief Complaint  Patient presents with  . Shortness of Breath  . Weakness    Lisa Cortez is a 86 y.o. female past medical history of CKD stage IV, hypertension, hyperlipidemia, COPD, NSTEMI, COVID-19, hypothyroidism, presenting to the emergency department from [facility] independent living facility for shortness of breath and hypoxia.  It is reported that she was hypoxic on arrival at 80% on room air, improved to 95% on 3 L nasal cannula.  She does not require oxygen at baseline.  Patient states yesterday she was having some shortness of breath chest pain.  Her chest pain was described as an aching pain that was rather constant throughout the day, however improved by bedtime.  She is feeling some intermittent shortness of breath though does not feel short of breath today.  She states she could feel that her blood pressure was high over the last few days she had a mild headache however that resolved.  Reports her symptoms do not feel much different from her ongoing chronic intermittent shortness of breath related to her heart failure.  Denies any new swelling her extremities, with the exception of a wound to her left ankle after dropping a hard backed book on it a few days ago.  No fevers or new cough, no abdominal pain, nausea, diaphoresis.  Per chart review, patient was admitted in July for NSTEMI, acute respiratory failure with COVID-19. She has reassuring ECHO at that time with EF 50-55%. No other additional cardiac imaging is identified from that admission.  The history is provided by the patient and medical records.       Past Medical History:  Diagnosis Date  . Anemia of renal disease   . Aortic insufficiency   . Aortic stenosis   . Chronic kidney disease (CKD), stage IV (severe) ([place])   . COPD (chronic obstructive pulmonary disease) ([place])   .  Diastolic dysfunction   . Hyperlipidemia   . Hypertension   . Hypothyroidism   . 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 AV block, 3rd degree ([place]) 11/25/2020  . CHF (congestive heart failure) ([place]) 11/25/2020  . NSTEMI (non-ST elevated myocardial infarction) ([place]) 07/09/2020  . Chronic kidney disease (CKD), stage IV (severe) ([place])   . Hyperlipidemia   . Hypothyroidism   . Normocytic anemia   . Transaminitis   . Diastolic dysfunction   . Acute respiratory disease due to COVID-19 virus   . Sepsis ([place]) 08/20/2019  . Pneumonia 08/20/2019  . COPD with acute exacerbation ([place]) 08/20/2019  . Acute on chronic respiratory failure with hypoxia ([place]) 08/20/2019  . Chest pain 08/20/2019  . Nausea and vomiting 08/20/2019  . Macrocytic anemia 08/20/2019  . Hypertension 08/20/2019  . 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 BREAST REDUCTION SURGERY    . CHOLECYSTECTOMY       OB History   No obstetric history on file.     Family History  Problem Relation Age of Onset  . CVA Mother   . Bone cancer Brother   . Dementia Brother     Social History   Tobacco Use  . Smoking status: Former Research scientist (life sciences)  . Smokeless tobacco: Never Used  Vaping Use  . Vaping Use: Never  used  Substance Use Topics  . Alcohol use: Yes    Comment: 1/2 glass of wine night  . Drug use: Never    Home Medications Prior to Admission medications   Medication Sig Start Date End Date Taking? Authorizing Provider  albuterol (VENTOLIN HFA) 108 (90 Base) MCG/ACT inhaler Inhale 2 puffs into the lungs every 4 (four) hours as needed. 05/25/19   [provider]  aspirin EC 81 MG tablet Take 1 tablet (81 mg total) by mouth daily. 07/19/20   Thurnell Lose, MD  atorvastatin (LIPITOR) 10 MG tablet Take 10 mg by mouth daily.  06/22/19   [provider]  carvedilol  (COREG) 3.125 MG tablet Take 1 tablet (3.125 mg total) by mouth 2 (two) times daily with a meal. 07/15/20   Thurnell Lose, MD  Cyanocobalamin (B-12) 2000 MCG TABS Take 1,000 mcg by mouth daily.    [provider]  Fluticasone-Salmeterol (ADVAIR) 250-50 MCG/DOSE AEPB Inhale 1 puff into the lungs 2 (two) times daily. 07/31/18   [provider]  isosorbide mononitrate (IMDUR) 30 MG 24 hr tablet Take 1 tablet (30 mg total) by mouth daily. 07/15/20 08/14/20  Thurnell Lose, MD  levothyroxine (SYNTHROID) 75 MCG tablet Take 75 mcg by mouth daily. 08/14/19   [provider]  memantine (NAMENDA) 5 MG tablet Take 5 mg by mouth daily.  10/23/18   [provider]  montelukast (SINGULAIR) 10 MG tablet Take 10 mg by mouth daily. 06/18/19   [provider]  oxyCODONE (ROXICODONE) 5 MG immediate release tablet Take 1 tablet (5 mg total) by mouth every 4 (four) hours as needed for severe pain. 02/72/53   Delora Fuel, MD  pantoprazole (PROTONIX) 40 MG tablet Take 1 tablet (40 mg total) by mouth 2 (two) times daily. 07/15/20   Thurnell Lose, MD  pramipexole (MIRAPEX) 0.25 MG tablet Take 0.25 mg by mouth daily.  06/19/19   [provider]  sodium bicarbonate 650 MG tablet Take 1,300 mg by mouth 2 (two) times daily.  06/19/19   [provider]    Allergies    Tenex [guanfacine hcl], Calcitonin, Fosamax [alendronate sodium], Gatifloxacin, Procrit [epoetin (alfa)], and Verapamil  Review of Systems   Review of Systems  Constitutional: Negative for diaphoresis and fever.  Respiratory: Positive for shortness of breath. Negative for cough.   Cardiovascular: Positive for chest pain. Negative for leg swelling.  Gastrointestinal: Negative for abdominal pain and nausea.  Skin: Positive for wound.  All other systems reviewed and are negative.   Physical Exam Updated Vital Signs BP (!) 211/181   Pulse 80   Temp 97.7 F (36.5 C) (Oral)   Resp 20   Ht 4'  11" (1.499 m)   Wt 62 kg   SpO2 93%   BMI 27.61 kg/m   Physical Exam Vitals and nursing note reviewed.  Constitutional:      General: She is not in acute distress.    Appearance: She is well-developed. She is not ill-appearing.  HENT:     Head: Normocephalic and atraumatic.  Eyes:     Conjunctiva/sclera: Conjunctivae normal.  Cardiovascular:     Rate and Rhythm: Regular rhythm.     Comments: Variable rate, bradycardia to 70s.  Pulmonary:     Effort: Pulmonary effort is normal.     Breath sounds: Examination of the right-lower field reveals rales. Examination of the left-lower field reveals rales. Rales present.  Abdominal:     Palpations: Abdomen is  soft.  Musculoskeletal:     Right lower leg: No edema.     Left lower leg: No edema.     Comments: Round nickel-sized wound just superior to left medial ankle. Localized mild surrounding erythema and swelling. No purulence. Nl ROM of the ankle.  Skin:    General: Skin is warm.  Neurological:     Mental Status: She is alert. Mental status is at baseline.  Psychiatric:        Behavior: Behavior normal.     ED Results / Procedures / Treatments   Labs (all labs ordered are listed, but only abnormal results are displayed) Labs Reviewed  BASIC METABOLIC PANEL - Abnormal; Notable for the following components:      Result Value   CO2 16 (*)    BUN 34 (*)    Creatinine, Ser 2.05 (*)    GFR, Estimated 22 (*)    Anion gap 17 (*)    All other components within normal limits  CBC - Abnormal; Notable for the following components:   RBC 3.85 (*)    MCV 101.6 (*)    RDW 16.2 (*)    All other components within normal limits  BRAIN NATRIURETIC PEPTIDE - Abnormal; Notable for the following components:   B Natriuretic Peptide 3,006.4 (*)    All other components within normal limits  TROPONIN I (HIGH SENSITIVITY) - Abnormal; Notable for the following components:   Troponin I (High Sensitivity) 19 (*)    All other components within  normal limits  TROPONIN I (HIGH SENSITIVITY) - Abnormal; Notable for the following components:   Troponin I (High Sensitivity) 23 (*)    All other components within normal limits  MAGNESIUM  TSH  CORTISOL-PM, BLOOD  MAGNESIUM  PHOSPHORUS    EKG EKG Interpretation  Date/Time:  Tuesday November 25 2020 12:53:56 EST Ventricular Rate:  40 PR Interval:  228 QRS Duration: 112 QT Interval:  645 QTC Calculation: 527 R Axis:   62 Text Interpretation: Second degree heart block Borderline prolonged PR interval Left atrial enlargement Anteroseptal infarct, age indeterminate Prolonged QT interval Confirmed by Noemi Chapel [phone]) on 11/25/2020 1:42:47 PM   Radiology DG Chest 2 View  Result Date: 11/25/2020 CLINICAL DATA:  Chest pain, shortness of breath. EXAM: CHEST - 2 VIEW COMPARISON:  October 31, 2020. FINDINGS: Stable cardiomegaly. Increased bilateral lung opacities are noted concerning for worsening edema or multifocal pneumonia. Small bilateral pleural effusions are noted. No pneumothorax is noted. Bony thorax is unremarkable. IMPRESSION: Increased bilateral lung opacities are noted concerning for worsening edema or multifocal pneumonia. Aortic Atherosclerosis (ICD10-I70.0). Electronically Signed   By: Marijo Conception M.D.   On: 11/25/2020 09:53   DG Ankle Complete Left  Result Date: 11/25/2020 CLINICAL DATA:  Acute left ankle pain and swelling after injury several days ago. EXAM: LEFT ANKLE COMPLETE - 3+ VIEW COMPARISON:  None. FINDINGS: There is no evidence of fracture, dislocation, or joint effusion. There is no evidence of arthropathy or other focal bone abnormality. Soft tissues are unremarkable. IMPRESSION: Negative. Electronically Signed   By: Marijo Conception M.D.   On: 11/25/2020 13:57    Procedures Procedures (including critical care time)  Medications Ordered in ED Medications  aspirin EC tablet 81 mg (has no administration in time range)  oxyCODONE (Oxy IR/ROXICODONE)  immediate release tablet 5 mg (has no administration in time range)  atorvastatin (LIPITOR) tablet 10 mg (has no administration in time range)  isosorbide mononitrate (IMDUR) 24 hr tablet 30  mg (has no administration in time range)  memantine (NAMENDA) tablet 5 mg (has no administration in time range)  levothyroxine (SYNTHROID) tablet 75 mcg (has no administration in time range)  pantoprazole (PROTONIX) EC tablet 40 mg (has no administration in time range)  sodium bicarbonate tablet 1,300 mg (has no administration in time range)  B-12 TABS 1,000 mcg (has no administration in time range)  pramipexole (MIRAPEX) tablet 0.25 mg (has no administration in time range)  ipratropium-albuterol (DUONEB) 0.5-2.5 (3) MG/3ML nebulizer solution 3 mL (3 mLs Nebulization Given 11/25/20 1613)  albuterol (VENTOLIN HFA) 108 (90 Base) MCG/ACT inhaler 2 puff (has no administration in time range)  fluticasone furoate-vilanterol (BREO ELLIPTA) 200-25 MCG/INH 1 puff (has no administration in time range)  montelukast (SINGULAIR) tablet 10 mg (has no administration in time range)  sodium chloride flush (NS) 0.9 % injection 3 mL (has no administration in time range)  sodium chloride flush (NS) 0.9 % injection 3 mL (has no administration in time range)  0.9 %  sodium chloride infusion (has no administration in time range)  acetaminophen (TYLENOL) tablet 650 mg (650 mg Oral Given 11/25/20 1607)  ondansetron (ZOFRAN) injection 4 mg (has no administration in time range)  heparin injection 5,000 Units (has no administration in time range)  furosemide (LASIX) injection 20 mg (has no administration in time range)  hydrALAZINE (APRESOLINE) injection 5 mg (5 mg Intravenous Given 11/25/20 1601)  furosemide (LASIX) injection 20 mg (20 mg Intravenous Given 11/25/20 1413)  calcium gluconate 1 g/ 50 mL sodium chloride IVPB (0 mg Intravenous Stopped 11/25/20 1546)    ED Course  I have reviewed the triage vital signs and the nursing  notes.  Pertinent labs & imaging results that were available during my care of the patient were reviewed by me and considered in my medical decision making (see chart for details).  Clinical Course as of Nov 25 1621  Tue Nov 25, 2020  1234 Call placed to cardiology for new Mobitz 2 heart block   [JR]  1342 Dr. Roosevelt Locks accepting admission   [JR]    Clinical Course User Index [JR] Mckynna Vanloan, [place] N, PA-C    Maziyah Vessel was evaluated in Emergency Department on 11/25/2020 for the symptoms described in the history of present illness. She was evaluated in the context of the global COVID-19 pandemic, which necessitated consideration that the patient might be at risk for infection with the SARS-CoV-2 virus that causes COVID-19. Institutional protocols and algorithms that pertain to the evaluation of patients at risk for COVID-19 are in a state of rapid change based on information released by regulatory bodies including the CDC and federal and state organizations. These policies and algorithms were followed during the patient's care in the ED. MDM Rules/Calculators/A&P                            Patient presenting from independent living facility for shortness of breath, chest pain, hypoxia.  She is noted to be hypoxic at 80% on room air, improved with 3 L nasal cannula.  On evaluation she is alert and appears to be mentating at her baseline.  She is not currently complaining of any chest pain or shortness of breath.  On exam, she is afebrile, hypertensive 154/139, though BP is higher on arrival at  230/62.  She has rales on lung exam, no peripheral edema is noted.  She is also noted to have documented variable heart rate, well reevaluating  patient, noticed findings concerning for heart block.  This is confirmed by repeat EKG, patient appears to have Mobitz 2 heart block is new.  There is no prior documentation of this.  Presentation is also concerning for heart failure, worsening pulmonary edema on chest  x-ray, BNP of 3000, troponin is mildly elevated at 19 and 23.  Metabolic panel with renal function at baseline   consulted with cardiology for new heart block.  Patient does take carvedilol, cardiology recommends hold this medication, admit to medicine service.  Patient is also given small dose of Lasix, Dr. Roosevelt Locks accepting admission for CHF exacerbation with hypoxia, and new onset Mobitz 2 heart block,.  Patient was discussed with and evaluated by Dr. Sabra Heck.   Final Clinical Impression(s) / ED Diagnoses Final diagnoses:  Mobitz type 2 second degree AV block  Acute on chronic congestive heart failure, unspecified heart failure type [facility])    Rx / DC Orders ED Discharge Orders    None       Raydon Chappuis, [place] N, PA-C 11/25/20 1624    Noemi Chapel, MD 11/26/20 [phone]

## 2020-11-25 NOTE — Consult Note (Addendum)
Cardiology Consultation:   Patient ID: Lisa Cortez MRN: [id number]; DOB: [birth year]  Admit date: 11/25/2020 Date of Consult: 11/25/2020  Primary Care Provider: Lajean Manes, MD [facility] Cardiologist: No primary care provider on file.  [facility] Electrophysiologist:  None   Patient Profile:   Lisa Cortez is a 86 y.o. female with a history of anemia, COPD, CKD stage 4, HLD, chronic diastolic CHF, HTN, memory issues and hypothyroidism who is being seen today for the evaluation of bradycardia at the request of Dr. Roosevelt Locks.   History of Present Illness:   Lisa Cortez is a 86 y.o. female with a history of anemia, COPD, CKD stage 4, HLD, chronic diastolic CHF, HTN, memory issues and hypothyroidism who is being seen today for the evaluation of bradycardia at the request of Dr. Roosevelt Locks. She has not been followed by cardiology. She presented to the ED with c/o dyspnea and orthopnea. No chest pain. Heart rate in the 40s. She takes Coreg at home. Echo in July 2021 with normal LV systolic function, AOZH=08-65%. Asymmetric LVH of the basal-septal segment. RV function is normal. Chest x-ray with pulmonary edema. BNP 3,006. Coreg has been held. Lasix has been given.   She tells me that she had some dizziness yesterday. No syncope. She lives alone at [facility] in an apartment. Breathing better after Lasix.   Past Medical History:  Diagnosis Date  . Anemia of renal disease   . Aortic insufficiency   . Aortic stenosis   . Chronic kidney disease (CKD), stage IV (severe) ([place])   . COPD (chronic obstructive pulmonary disease) ([place])   . Diastolic dysfunction   . Hyperlipidemia   . Hypertension   . Hypothyroidism   . 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 BREAST REDUCTION SURGERY    . CHOLECYSTECTOMY         Inpatient Medications: Scheduled Meds: . aspirin EC  81 mg Oral Daily  . atorvastatin  10 mg  Oral Daily  . B-12  1,000 mcg Oral Daily  . fluticasone furoate-vilanterol  1 puff Inhalation Daily  . furosemide  20 mg Intravenous BID  . heparin  5,000 Units Subcutaneous Q12H  . ipratropium-albuterol  3 mL Nebulization Q6H  . isosorbide mononitrate  30 mg Oral Daily  . levothyroxine  75 mcg Oral Daily  . memantine  5 mg Oral Daily  . montelukast  10 mg Oral Daily  . pantoprazole  40 mg Oral BID  . pramipexole  0.25 mg Oral Daily  . sodium bicarbonate  1,300 mg Oral BID  . sodium chloride flush  3 mL Intravenous Q12H   Continuous Infusions: . sodium chloride     PRN Meds: sodium chloride, acetaminophen, albuterol, hydrALAZINE, ondansetron (ZOFRAN) IV, oxyCODONE, sodium chloride flush  Allergies:    Allergies  Allergen Reactions  . Tenex [Guanfacine Hcl] Other (See Comments)    unknown  . Calcitonin Other (See Comments)    unknown  . Fosamax [Alendronate Sodium] Other (See Comments)    unknown  . Gatifloxacin Other (See Comments)    unknown  . Procrit [Epoetin (Alfa)] Other (See Comments)    unknown  . Verapamil Other (See Comments)    unknown    Social History:   Social History   Socioeconomic History  . Marital status: Single    Spouse name: Not on file  . Number of children: Not on file  . Years of education: Not  on file  . Highest education level: Not on file  Occupational History  . Not on file  Tobacco Use  . Smoking status: Former Research scientist (life sciences)  . Smokeless tobacco: Never Used  Vaping Use  . Vaping Use: Never used  Substance and Sexual Activity  . Alcohol use: Yes    Comment: 1/2 glass of wine night  . Drug use: Never  . Sexual activity: Not on file  Other Topics Concern  . Not on file  Social History Narrative  . Not on file   Social Determinants of Health   Financial Resource Strain:   . Difficulty of Paying Living Expenses: Not on file  Food Insecurity:   . Worried About Charity fundraiser in the Last Year: Not on file  . Ran Out of Food in  the Last Year: Not on file  Transportation Needs:   . Lack of Transportation (Medical): Not on file  . Lack of Transportation (Non-Medical): Not on file  Physical Activity:   . Days of Exercise per Week: Not on file  . Minutes of Exercise per Session: Not on file  Stress:   . Feeling of Stress : Not on file  Social Connections:   . Frequency of Communication with Friends and Family: Not on file  . Frequency of Social Gatherings with Friends and Family: Not on file  . Attends Religious Services: Not on file  . Active Member of Clubs or Organizations: Not on file  . Attends Archivist Meetings: Not on file  . Marital Status: Not on file  Intimate Partner Violence:   . Fear of Current or Ex-Partner: Not on file  . Emotionally Abused: Not on file  . Physically Abused: Not on file  . Sexually Abused: Not on file    Family History:   Family History  Problem Relation Age of Onset  . CVA Mother   . Bone cancer Brother   . Dementia Brother      ROS:  Please see the history of present illness.  All other ROS reviewed and negative.     Physical Exam/Data:   Vitals:   11/25/20 1445 11/25/20 1530 11/25/20 1601 11/25/20 1615  BP: (!) 211/181 (!) 217/104 (!) 211/181 (!) 159/97  Pulse: 87 80  (!) 43  Resp: 17 20  20   Temp:      TempSrc:      SpO2: 92% 93%  95%  Weight:      Height:       No intake or output data in the 24 hours ending 11/25/20 1647 Last 3 Weights 11/25/2020 10/31/2020 07/14/2020  Weight (lbs) 136 lb 11 oz 136 lb 11 oz 136 lb 11 oz  Weight (kg) 62 kg 62 kg 62 kg     Body mass index is 27.61 kg/m.  General:  Thin cachectic female in NAD.  HEENT: normal Lymph: no adenopathy Neck: + JVD Endocrine:  No thryomegaly Vascular: No carotid bruits; FA pulses 2+ bilaterally without bruits  Cardiac:  Regular, bradycardic. no murmur  Lungs:  Basilar crackles.   Abd: soft, nontender, no hepatomegaly  Ext: no edema Musculoskeletal:  No deformities, BUE and BLE  strength normal and equal Skin: warm and dry  Neuro:  CNs 2-12 intact, no focal abnormalities noted Psych:  Normal affect   EKG:  The EKG was personally reviewed and demonstrates:  Sinus with second degree AV block. Poor R wave progression precordial Telemetry:  Telemetry was personally reviewed and demonstrates:  Sinus with  2:1 heart block.   Relevant CV Studies:   Echo 07/10/20: 1. Left ventricular ejection fraction, by estimation, is 50 to 55%. The  left ventricle has low normal function. The left ventricle demonstrates  regional wall motion abnormalities (see scoring diagram/findings for  description). Apical akinesis. There is  moderate asymmetric left ventricular hypertrophy of the basal-septal  segment. Left ventricular diastolic parameters are consistent with Grade  II diastolic dysfunction (pseudonormalization). Elevated left atrial  pressure.  2. Right ventricular systolic function is normal. The right ventricular  size is normal. Tricuspid regurgitation signal is inadequate for assessing  PA pressure.  3. The mitral valve is abnormal. Moderate mitral annular calcification.  No evidence of mitral valve regurgitation.  4. The aortic valve was not well visualized. Aortic valve regurgitation  is trivial. No aortic stenosis is present.   Laboratory Data:  High Sensitivity Troponin:   Recent Labs  Lab 11/25/20 0926 11/25/20 1246  TROPONINIHS 19* 23*     Chemistry Recent Labs  Lab 11/25/20 0926  NA 141  K 4.4  CL 108  CO2 16*  GLUCOSE 91  BUN 34*  CREATININE 2.05*  CALCIUM 9.2  GFRNONAA 22*  ANIONGAP 17*    No results for input(s): PROT, ALBUMIN, AST, ALT, ALKPHOS, BILITOT in the last 168 hours. Hematology Recent Labs  Lab 11/25/20 0926  WBC 5.7  RBC 3.85*  HGB 12.1  HCT 39.1  MCV 101.6*  MCH 31.4  MCHC 30.9  RDW 16.2*  PLT 194   BNP Recent Labs  Lab 11/25/20 1246  BNP 3,006.4*    DDimer No results for input(s): DDIMER in the last 168  hours.   Radiology/Studies:  DG Chest 2 View  Result Date: 11/25/2020 CLINICAL DATA:  Chest pain, shortness of breath. EXAM: CHEST - 2 VIEW COMPARISON:  October 31, 2020. FINDINGS: Stable cardiomegaly. Increased bilateral lung opacities are noted concerning for worsening edema or multifocal pneumonia. Small bilateral pleural effusions are noted. No pneumothorax is noted. Bony thorax is unremarkable. IMPRESSION: Increased bilateral lung opacities are noted concerning for worsening edema or multifocal pneumonia. Aortic Atherosclerosis (ICD10-I70.0). Electronically Signed   By: Marijo Conception M.D.   On: 11/25/2020 09:53   DG Ankle Complete Left  Result Date: 11/25/2020 CLINICAL DATA:  Acute left ankle pain and swelling after injury several days ago. EXAM: LEFT ANKLE COMPLETE - 3+ VIEW COMPARISON:  None. FINDINGS: There is no evidence of fracture, dislocation, or joint effusion. There is no evidence of arthropathy or other focal bone abnormality. Soft tissues are unremarkable. IMPRESSION: Negative. Electronically Signed   By: Marijo Conception M.D.   On: 11/25/2020 13:57     Assessment and Plan:   1. Second degree AV block: BP is elevated. She has is overall asymptomatic at this time regarding her heart rate and AV block. Agree with holding Coreg. Given her advanced age and poor functional status, she is likely not a candidate for a permanent pacemaker. No indication for a temporary pacemaker tonight. Monitor overnight off of Coreg. I will ask EP to see her in the am.  2. Acute on chronic diastolic CHF: Agree with diuresis with IV Lasix tonight.    For questions or updates, please contact [place] Please consult [email or web address] for contact info under    Signed, Lauree Chandler, MD  11/25/2020 4:47 PM

## 2020-11-25 NOTE — ED Provider Notes (Signed)
The patient is a 86 year old female, she has no prior history of heart block, presents with shortness of breath and chest discomfort that started yesterday, a little bit hypoxic, has rales on my exam as well as it is slightly irregular heartbeat and according to the monitor and the EKG strips which I have the patient has what appears to be a type II second-degree heart block.  We will need to consult with cardiology, troponin is minimally elevated, patient did have Covid during her last admission but seem to be asymptomatic  Medical screening examination/treatment/procedure(s) were conducted as a shared visit with non-physician practitioner(s) and myself.  I personally evaluated the patient during the encounter.  Clinical Impression:   Final diagnoses:  [place]itz type 2 second degree AV block  Acute on chronic congestive heart failure, unspecified heart failure type [facility] X[facility])  Hypertensive urgency  Hypoxia       Noemi Chapel, MD 11/26/20 [phone]

## 2020-11-25 NOTE — ED Notes (Signed)
Given dinner tray

## 2020-11-25 NOTE — H&P (Signed)
History and Physical    Lisa Cortez TDV:[id number] DOB: 1925/05/03 DOA: 11/25/2020  PCP: Lajean Manes, MD (Confirm with patient/family/NH records and if not entered, this has to be entered at [facility] point of entry) Patient coming from: Home  I have personally briefly reviewed patient's old medical records in [place]  Chief Complaint: SOB, light headed.  HPI: Lisa Cortez is a 86 y.o. female with medical history significant of NSTEMI (July 2021), HTN, COPD, CKD stage IV, hypothyroidism, presented with increasing shortness of breath and episodes of near syncope.  Patient resides at independent living by herself.  Patient was admitted to hospital in July for non-ST elevation MI.  Ischemia work-up was not done at that time due to patient also had COVID-19 infection.  Patient was treated with ACS medication and discharged home, and the plan was to do with ischemia work-up as outpatient.  But it appeared patient never follow-up with cardiology since last admission.  Starting from 1 to 2 weeks ago, patient started to feel increasing shortness of breath, patient does have a baseline exertional became worse and for the last which she attributed to her COPD problem. Gradually her dyspnea issue became worse, and for the last 2 nights, she was not able to sleep flat on the bed.  She has mild cough, no phlegm production, no fever chills no chest pains.  She also complains about occasionally feeling lightheadedness, but denied any palpitations sweating nausea.  ED Course: Heart rate in the 40s, but blood pressure significantly elevated, tachypneic with borderline hypoxia with O2 saturation stabilized on 2 L, chest x-ray showed pulmonary edema.  20 mg IV Lasix given in the ED.  Review of Systems: As per HPI otherwise 14 point review of systems negative.    Past Medical History:  Diagnosis Date  . Anemia of renal disease   . Aortic insufficiency   . Aortic stenosis   . Chronic kidney disease (CKD),  stage IV (severe) ([place])   . COPD (chronic obstructive pulmonary disease) ([place])   . Diastolic dysfunction   . Hyperlipidemia   . Hypertension   . Hypothyroidism   . 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 BREAST REDUCTION SURGERY    . CHOLECYSTECTOMY       reports that she has quit smoking. She has never used smokeless tobacco. She reports current alcohol use. She reports that she does not use drugs.  Allergies  Allergen Reactions  . Tenex [Guanfacine Hcl] Other (See Comments)    unknown  . Calcitonin Other (See Comments)    unknown  . Fosamax [Alendronate Sodium] Other (See Comments)    unknown  . Gatifloxacin Other (See Comments)    unknown  . Procrit [Epoetin (Alfa)] Other (See Comments)    unknown  . Verapamil Other (See Comments)    unknown    Family History  Problem Relation Age of Onset  . CVA Mother   . Bone cancer Brother   . Dementia Brother      Prior to Admission medications   Medication Sig Start Date End Date Taking? Authorizing Provider  albuterol (VENTOLIN HFA) 108 (90 Base) MCG/ACT inhaler Inhale 2 puffs into the lungs every 4 (four) hours as needed. 05/25/19   [provider]  aspirin EC 81 MG tablet Take 1 tablet (81 mg total) by mouth daily. 07/19/20   Thurnell Lose, MD  atorvastatin (LIPITOR) 10 MG tablet Take 10 mg  by mouth daily.  06/22/19   [provider]  carvedilol (COREG) 3.125 MG tablet Take 1 tablet (3.125 mg total) by mouth 2 (two) times daily with a meal. 07/15/20   Thurnell Lose, MD  Cyanocobalamin (B-12) 2000 MCG TABS Take 1,000 mcg by mouth daily.    [provider]  Fluticasone-Salmeterol (ADVAIR) 250-50 MCG/DOSE AEPB Inhale 1 puff into the lungs 2 (two) times daily. 07/31/18   [provider]  isosorbide mononitrate (IMDUR) 30 MG 24 hr tablet Take 1 tablet (30 mg total) by mouth daily. 07/15/20 08/14/20  Thurnell Lose, MD   levothyroxine (SYNTHROID) 75 MCG tablet Take 75 mcg by mouth daily. 08/14/19   [provider]  memantine (NAMENDA) 5 MG tablet Take 5 mg by mouth daily.  10/23/18   [provider]  montelukast (SINGULAIR) 10 MG tablet Take 10 mg by mouth daily. 06/18/19   [provider]  oxyCODONE (ROXICODONE) 5 MG immediate release tablet Take 1 tablet (5 mg total) by mouth every 4 (four) hours as needed for severe pain. 93/71/69   Delora Fuel, MD  pantoprazole (PROTONIX) 40 MG tablet Take 1 tablet (40 mg total) by mouth 2 (two) times daily. 07/15/20   Thurnell Lose, MD  pramipexole (MIRAPEX) 0.25 MG tablet Take 0.25 mg by mouth daily.  06/19/19   [provider]  sodium bicarbonate 650 MG tablet Take 1,300 mg by mouth 2 (two) times daily.  06/19/19   [provider]    Physical Exam: Vitals:   11/25/20 1133 11/25/20 1209 11/25/20 1245 11/25/20 1300  BP: (!) 226/88 (!) 154/139 (!) 154/110 (!) 168/113  Pulse: (!) 40 81 66 78  Resp: 20  17 (!) 21  Temp:      TempSrc:      SpO2: 97%  97% 92%  Weight:  62 kg    Height:  4\' 11"  (1.499 m)      Constitutional: NAD, calm, comfortable Vitals:   11/25/20 1133 11/25/20 1209 11/25/20 1245 11/25/20 1300  BP: (!) 226/88 (!) 154/139 (!) 154/110 (!) 168/113  Pulse: (!) 40 81 66 78  Resp: 20  17 (!) 21  Temp:      TempSrc:      SpO2: 97%  97% 92%  Weight:  62 kg    Height:  4\' 11"  (1.499 m)     Eyes: PERRL, lids and conjunctivae normal ENMT: Mucous membranes are moist. Posterior pharynx clear of any exudate or lesions.Normal dentition.  Neck: normal, supple, no masses, no thyromegaly Respiratory: clear to auscultation bilaterally, no wheezing, diffused crackles to the mid level bilateral lungs.  Increasing respiratory effort, talking in broken sentences. No accessory muscle use.  Cardiovascular: Bradycardia regular rate and rhythm, no murmurs / rubs / gallops. No extremity edema. 2+ pedal pulses. No carotid  bruits.  Abdomen: no tenderness, no masses palpated. No hepatosplenomegaly. Bowel sounds positive.  Musculoskeletal: no clubbing / cyanosis. No joint deformity upper and lower extremities. Good ROM, no contractures. Normal muscle tone.  Skin: no rashes, lesions, ulcers. No induration Neurologic: CN 2-12 grossly intact. Sensation intact, DTR normal. Strength 5/5 in all 4.  Psychiatric: Normal judgment and insight. Alert and oriented x 3. Normal mood.     Labs on Admission: I have personally reviewed following labs and imaging studies  CBC: Recent Labs  Lab 11/25/20 0926  WBC 5.7  HGB 12.1  HCT 39.1  MCV 101.6*  PLT 678   Basic Metabolic Panel: Recent Labs  Lab 11/25/20 0926  NA 141  K 4.4  CL 108  CO2 16*  GLUCOSE 91  BUN 34*  CREATININE 2.05*  CALCIUM 9.2   GFR: Estimated Creatinine Clearance: 13.7 mL/min (A) (by C-G formula based on SCr of 2.05 mg/dL (H)). Liver Function Tests: No results for input(s): AST, ALT, ALKPHOS, BILITOT, PROT, ALBUMIN in the last 168 hours. No results for input(s): LIPASE, AMYLASE in the last 168 hours. No results for input(s): AMMONIA in the last 168 hours. Coagulation Profile: No results for input(s): INR, PROTIME in the last 168 hours. Cardiac Enzymes: No results for input(s): CKTOTAL, CKMB, CKMBINDEX, TROPONINI in the last 168 hours. BNP (last 3 results) No results for input(s): PROBNP in the last 8760 hours. HbA1C: No results for input(s): HGBA1C in the last 72 hours. CBG: No results for input(s): GLUCAP in the last 168 hours. Lipid Profile: No results for input(s): CHOL, HDL, LDLCALC, TRIG, CHOLHDL, LDLDIRECT in the last 72 hours. Thyroid Function Tests: No results for input(s): TSH, T4TOTAL, FREET4, T3FREE, THYROIDAB in the last 72 hours. Anemia Panel: No results for input(s): VITAMINB12, FOLATE, FERRITIN, TIBC, IRON, RETICCTPCT in the last 72 hours. Urine analysis: No results found for: COLORURINE, APPEARANCEUR, LABSPEC,  [place], GLUCOSEU, HGBUR, BILIRUBINUR, KETONESUR, PROTEINUR, UROBILINOGEN, NITRITE, LEUKOCYTESUR  Radiological Exams on Admission: DG Chest 2 View  Result Date: 11/25/2020 CLINICAL DATA:  Chest pain, shortness of breath. EXAM: CHEST - 2 VIEW COMPARISON:  October 31, 2020. FINDINGS: Stable cardiomegaly. Increased bilateral lung opacities are noted concerning for worsening edema or multifocal pneumonia. Small bilateral pleural effusions are noted. No pneumothorax is noted. Bony thorax is unremarkable. IMPRESSION: Increased bilateral lung opacities are noted concerning for worsening edema or multifocal pneumonia. Aortic Atherosclerosis (ICD10-I70.0). Electronically Signed   By: Marijo Conception M.D.   On: 11/25/2020 09:53   DG Ankle Complete Left  Result Date: 11/25/2020 CLINICAL DATA:  Acute left ankle pain and swelling after injury several days ago. EXAM: LEFT ANKLE COMPLETE - 3+ VIEW COMPARISON:  None. FINDINGS: There is no evidence of fracture, dislocation, or joint effusion. There is no evidence of arthropathy or other focal bone abnormality. Soft tissues are unremarkable. IMPRESSION: Negative. Electronically Signed   By: Marijo Conception M.D.   On: 11/25/2020 13:57    EKG: Independently reviewed. 3rd degree AV block  Assessment/Plan Active Problems:   AV block, 3rd degree (HCC)   CHF (congestive heart failure) ([place])  (please populate well all problems here in Problem List. (For example, if patient is on BP meds at home and you resume or decide to hold them, it is a problem that needs to be her. Same for CAD, COPD, HLD and so on)  Acute on chronic diastolic CHF decompensation -Fluid overload, blood pressure significantly elevated, but running bradycardia. -Agree with 20 mg IV Lasix x1 and then twice daily, daily weight, recheck chest x-ray tomorrow. -Echocardiogram. -Hydralazine as needed, continue Imdur for now, discontinue Coreg.  Symptomatic bradycardia with 2nd degree vs 3rd degree AV  block -Suspect Coreg toxicity, IV calcium gluconate x1, monitor her heart rate, if no significant improvement or blood pressure not maintaining, will consider glucagon infusion and or inotrope. -Cardiology on board, but given patient age and overall condition, I do not believe she is a candidate for PPM. -As BP maintained, will not order atropine. -Standing dose of albuterol x4 doses -Check TSH and cortisol level  HTN emergency -Will try PRN Hydralazine and slowly introduce a standing BP meds later.  CKD stage  IV -With signs of CHF decompensation of fluid overload, start IV diuresis while monitoring her kidney function -Discussion with patient's daughter over the phone regarding patient's overall conditions, probably the best thing to do is optimizing patient's CHF/BP meds. It appears that patient has been following with nephrologist regarding her CKD and nephrologist as mentioned simply that medical management aligns best with patient conditions. Daughter agreed.  COPD -No symptoms signs of acute exacerbation, continue long-acting and short-acting breathing meds  Hypothyroidism -Continue Synthroid  HLD -Statin   DVT prophylaxis: Heparin subcu Code Status: DNR Family Communication: Daughter over the phone Disposition Plan: With significant complicated medical problem of CHF and bradycardia, expect more than 2 midnight hospital stay Consults called: Cardio Admission status: PCU   Lequita Halt MD [facility] Pager [phone]  11/25/2020, 2:01 PM

## 2020-11-25 NOTE — ED Triage Notes (Signed)
Pt here from [place] with c/o shob x 1 week with chest pain and weakness since yesterday. 80% Spo2 room air with improvement to 95% on 3L. Also c/o pain to LLE after dropping a hard backed book on it a few days ago.

## 2020-11-25 NOTE — Progress Notes (Signed)
Pt. Requesting med for sleep. On call for Cardiology paged to make aware.

## 2020-11-25 NOTE — Plan of Care (Signed)
  Problem: Education: Goal: Ability to demonstrate management of disease process will improve Outcome: Progressing   Problem: Clinical Measurements: Goal: Will remain free from infection Outcome: Progressing   Problem: Activity: Goal: Risk for activity intolerance will decrease Outcome: Progressing   Problem: Safety: Goal: Ability to remain free from injury will improve Outcome: Progressing

## 2020-11-25 NOTE — ED Notes (Addendum)
Handoff given to Santiago Glad, RN on 3E.   All belongings with patient.

## 2020-11-25 NOTE — ED Notes (Signed)
Placed on bedpan      Voided approx 300cc and had a small bm.  Cleaned.  She became very sob with wheezing.  Much better now . No longer has audible wheezing

## 2020-11-25 NOTE — ED Notes (Signed)
purewick placed

## 2020-11-25 NOTE — ED Notes (Signed)
Cards at bedside.  Aware of BP

## 2020-11-25 NOTE — ED Notes (Signed)
Text via Epic about hydralazine order.   Waiting for response.

## 2020-11-26 ENCOUNTER — Inpatient Hospital Stay (HOSPITAL_COMMUNITY): Payer: Medicare Other

## 2020-11-26 ENCOUNTER — Encounter (HOSPITAL_COMMUNITY)
Admission: EM | Disposition: A | Discharge status: Home Health | Payer: Self-pay | Source: Home / Self Care | Attending: Internal Medicine

## 2020-11-26 DIAGNOSIS — I442 Atrioventricular block, complete: Secondary | ICD-10-CM

## 2020-11-26 DIAGNOSIS — I5031 Acute diastolic (congestive) heart failure: Secondary | ICD-10-CM

## 2020-11-26 DIAGNOSIS — I160 Hypertensive urgency: Secondary | ICD-10-CM

## 2020-11-26 DIAGNOSIS — E039 Hypothyroidism, unspecified: Secondary | ICD-10-CM

## 2020-11-26 HISTORY — PX: PACEMAKER IMPLANT: EP1218

## 2020-11-26 LAB — ECHOCARDIOGRAM COMPLETE
AR max vel: 1.19 cm2
AV Area VTI: 1.36 cm2
AV Area mean vel: 1.18 cm2
AV Mean grad: 10 mmHg
AV Peak grad: 20.1 mmHg
Ao pk vel: 2.24 m/s
Area-P 1/2: 5.54 cm2
Height: 59 in
P 1/2 time: 831 msec
S' Lateral: 2.4 cm
Weight: 1791.9 oz

## 2020-11-26 LAB — BASIC METABOLIC PANEL
Anion gap: 14 (ref 5–15)
BUN: 37 mg/dL — ABNORMAL HIGH (ref 8–23)
CO2: 20 mmol/L — ABNORMAL LOW (ref 22–32)
Calcium: 9.1 mg/dL (ref 8.9–10.3)
Chloride: 107 mmol/L (ref 98–111)
Creatinine, Ser: 2.15 mg/dL — ABNORMAL HIGH (ref 0.44–1.00)
GFR, Estimated: 21 mL/min — ABNORMAL LOW (ref >60)
Glucose, Bld: 90 mg/dL (ref 70–99)
Potassium: 3.9 mmol/L (ref 3.5–5.1)
Sodium: 141 mmol/L (ref 135–145)

## 2020-11-26 LAB — SARS CORONAVIRUS 2 BY RT PCR (HOSPITAL ORDER, PERFORMED IN ~~LOC~~ HOSPITAL LAB): SARS Coronavirus 2: NEGATIVE

## 2020-11-26 LAB — SURGICAL PCR SCREEN
MRSA, PCR: NEGATIVE
Staphylococcus aureus: NEGATIVE

## 2020-11-26 SURGERY — PACEMAKER IMPLANT

## 2020-11-26 MED ORDER — HEPARIN (PORCINE) IN NACL 1000-0.9 UT/500ML-% IV SOLN
INTRAVENOUS | Status: DC | PRN
  Administered 2020-11-26: 500 mL

## 2020-11-26 MED ORDER — SODIUM CHLORIDE 0.9 % IV SOLN
INTRAVENOUS | Status: AC
  Filled 2020-11-26: qty 2

## 2020-11-26 MED ORDER — SODIUM CHLORIDE 0.9 % IV SOLN: INTRAVENOUS | Status: DC

## 2020-11-26 MED ORDER — LIDOCAINE HCL (PF) 1 % IJ SOLN
INTRAMUSCULAR | Status: DC | PRN
  Administered 2020-11-26: 80 mL

## 2020-11-26 MED ORDER — CHLORHEXIDINE GLUCONATE 4 % EX LIQD: 60.0000 mL | Freq: Once | CUTANEOUS | Status: DC

## 2020-11-26 MED ORDER — SODIUM CHLORIDE 0.9 % IV SOLN
80.0000 mg | INTRAVENOUS | Status: AC
  Administered 2020-11-26: 80 mg

## 2020-11-26 MED ORDER — CEFAZOLIN SODIUM-DEXTROSE 2-4 GM/100ML-% IV SOLN
2.0000 g | INTRAVENOUS | Status: AC
  Administered 2020-11-26: 2 g via INTRAVENOUS

## 2020-11-26 MED ORDER — ONDANSETRON HCL 4 MG/2ML IJ SOLN
INTRAMUSCULAR | Status: DC | PRN
  Administered 2020-11-26: 4 mg via INTRAVENOUS

## 2020-11-26 MED ORDER — LIDOCAINE HCL (PF) 1 % IJ SOLN
INTRAMUSCULAR | Status: AC
  Filled 2020-11-26: qty 30

## 2020-11-26 MED ORDER — CEFAZOLIN SODIUM-DEXTROSE 1-4 GM/50ML-% IV SOLN
1.0000 g | INTRAVENOUS | Status: DC
  Filled 2020-11-26: qty 50

## 2020-11-26 MED ORDER — HYDRALAZINE HCL 20 MG/ML IJ SOLN
10.0000 mg | Freq: Once | INTRAMUSCULAR | Status: AC
  Administered 2020-11-26: 10 mg via INTRAVENOUS

## 2020-11-26 MED ORDER — FENTANYL CITRATE (PF) 100 MCG/2ML IJ SOLN
INTRAMUSCULAR | Status: AC
  Filled 2020-11-26: qty 2

## 2020-11-26 MED ORDER — HEPARIN (PORCINE) IN NACL 1000-0.9 UT/500ML-% IV SOLN
INTRAVENOUS | Status: AC
  Filled 2020-11-26: qty 500

## 2020-11-26 MED ORDER — LIDOCAINE HCL (PF) 1 % IJ SOLN
INTRAMUSCULAR | Status: AC
  Filled 2020-11-26: qty 60

## 2020-11-26 MED ORDER — FENTANYL CITRATE (PF) 100 MCG/2ML IJ SOLN
INTRAMUSCULAR | Status: DC | PRN
  Administered 2020-11-26 (×2): 12.5 ug via INTRAVENOUS

## 2020-11-26 MED ORDER — CEFAZOLIN SODIUM-DEXTROSE 2-4 GM/100ML-% IV SOLN
INTRAVENOUS | Status: AC
  Filled 2020-11-26: qty 100

## 2020-11-26 SURGICAL SUPPLY — 8 items
CABLE SURGICAL S-101-97-12 (CABLE) ×2 IMPLANT
LEAD TENDRIL MRI 46CM LPA1200M (Lead) ×2 IMPLANT
LEAD TENDRIL MRI 52CM LPA1200M (Lead) ×2 IMPLANT
PACEMAKER ASSURITY DR-RF (Pacemaker) ×2 IMPLANT
PAD PRO RADIOLUCENT 2001M-C (PAD) ×2 IMPLANT
SHEATH 8FR PRELUDE SNAP 13 (SHEATH) ×4 IMPLANT
SHEATH PROBE COVER 6X72 (BAG) ×2 IMPLANT
TRAY PACEMAKER INSERTION (PACKS) ×2 IMPLANT

## 2020-11-26 NOTE — Progress Notes (Signed)
  Echocardiogram 2D Echocardiogram has been performed.  Lisa Cortez 11/26/2020, 2:56 PM

## 2020-11-26 NOTE — Evaluation (Signed)
Physical Therapy Evaluation Patient Details Name: Lisa Cortez MRN: [id number] DOB: 1925/12/06 Today's Date: 11/26/2020   History of Present Illness  Lisa Cortez is a 86 y.o. female with medical history significant of NSTEMI (July 2021), HTN, COPD, CKD stage IV, hypothyroidism, presented with increasing shortness of breath and episodes of near syncope.Admitted with CHF exacerbation with sympatomic bradycardia and AV block, HTN emergency.   Clinical Impression  Prior to admission, pt lives at [place], uses cane and is independent with ADL's. Pt presents with decreased functional mobility secondary to weakness, balance deficits and decreased endurance. Pt ambulating 40 feet with a walker at a min guard assist level. SpO2 > 90% on 2L O2, irregular HR with bigeminy, and BP 195/76 post mobility. Suspect steady progress and pt has good family support. Recommend HHPT to maximize functional mobility.     Follow Up Recommendations Home health PT;Supervision for mobility/[facility] (at [facility])    Equipment Recommendations  None recommended by PT    Recommendations for Other Services OT consult     Precautions / Restrictions Precautions Precautions: Fall Restrictions Weight Bearing Restrictions: No      Mobility  Bed Mobility Overal bed mobility: Needs Assistance Bed Mobility: Supine to Sit;Sit to Supine     Supine to sit: Min guard Sit to supine: Min guard   General bed mobility comments: min guard for safety    Transfers Overall transfer level: Needs assistance Equipment used: Rolling walker (2 wheeled) Transfers: Sit to/from Stand Sit to Stand: Min guard         General transfer comment: Cues for hand placement  Ambulation/Gait Ambulation/Gait assistance: Min guard   Assistive device: Rolling walker (2 wheeled) Gait Pattern/deviations: Step-through pattern;Decreased stride length;Trunk flexed Gait velocity: decreased   General Gait Details: Slow and steady pace,  min guard for balance  Stairs            Wheelchair Mobility    Modified Rankin (Stroke Patients Only)       Balance Overall balance assessment: Needs assistance Sitting-balance support: Feet supported Sitting balance-Leahy Scale: Fair     Standing balance support: Bilateral upper extremity supported Standing balance-Leahy Scale: Poor Standing balance comment: reliant on external support                             Pertinent Vitals/Pain Pain Assessment: No/denies pain    Home Living Family/patient expects to be discharged to:: Private residence Living Arrangements: Alone Available Help at Discharge: Family;Available PRN/intermittently Type of Home: Independent living facility Home Access: Elevator     Home Layout: One level Home Equipment: [place] - 4 wheels;Cane - single point;Shower seat - built in Additional Comments: ILF at [facility]    Prior Function Level of Independence: Independent with assistive device(s)         Comments: Uses cane for community ambulation, walks to dining hall     Hand Dominance   Dominant Hand: Right    Extremity/Trunk Assessment   Upper Extremity Assessment Upper Extremity Assessment: Overall WFL for tasks assessed    Lower Extremity Assessment RLE Deficits / Details: Grossly 4/5 LLE Deficits / Details: Grossly 4/5    Cervical / Trunk Assessment Cervical / Trunk Assessment: Kyphotic  Communication   Communication: HOH  Cognition Arousal/Alertness: Awake/alert Behavior During Therapy: WFL for tasks assessed/performed Overall Cognitive Status: Within Functional Limits for tasks assessed  General Comments: some STM deficits, likely baseline      General Comments      Exercises     Assessment/Plan    PT Assessment Patient needs continued PT services  PT Problem List Decreased strength;Decreased activity tolerance;Decreased balance;Decreased  mobility       PT Treatment Interventions DME instruction;Gait training;Functional mobility training;Therapeutic activities;Therapeutic exercise;Balance training;Patient/family education    PT Goals (Current goals can be found in the Care Plan section)  Acute Rehab PT Goals Patient Stated Goal: return home PT Goal Formulation: With patient/family Time For Goal Achievement: 12/10/20 Potential to Achieve Goals: Good    Frequency Min 3X/week   Barriers to discharge        Co-evaluation               AM-PAC PT "6 Clicks" Mobility  Outcome Measure Help needed turning from your back to your side while in a flat bed without using bedrails?: None Help needed moving from lying on your back to sitting on the side of a flat bed without using bedrails?: A Little Help needed moving to and from a bed to a chair (including a wheelchair)?: A Little Help needed standing up from a chair using your arms (e.g., wheelchair or bedside chair)?: A Little Help needed to walk in hospital room?: A Little Help needed climbing 3-5 steps with a railing? : A Lot 6 Click Score: 18    End of Session Equipment Utilized During Treatment: Gait belt;Oxygen Activity Tolerance: Patient tolerated treatment well Patient left: in bed;with call bell/phone within reach;with bed alarm set;with family/visitor present Nurse Communication: Mobility status PT Visit Diagnosis: Unsteadiness on feet (R26.81);Muscle weakness (generalized) (M62.81);Difficulty in walking, not elsewhere classified (R26.2)    Time: 1126-1204 PT Time Calculation (min) (ACUTE ONLY): 38 min   Charges:   PT Evaluation $PT Eval Moderate Complexity: 1 Mod PT Treatments $Therapeutic Activity: 23-37 mins        Wyona Almas, PT, DPT Acute Rehabilitation Services Pager [phone] Office [phone]   Lisa Cortez 11/26/2020, 3:44 PM

## 2020-11-26 NOTE — Progress Notes (Signed)
PROGRESS NOTE    Lisa Cortez  LGX:[id number] DOB: 1925-08-16 DOA: 11/25/2020 PCP: Lajean Manes, MD   Brief Narrative:  HPI on 11/25/2020 by Dr. Wynetta Fines Lisa Cortez is a 86 y.o. female with medical history significant of NSTEMI (July 2021), HTN, COPD, CKD stage IV, hypothyroidism, presented with increasing shortness of breath and episodes of near syncope.  Patient resides at independent living by herself.  Patient was admitted to hospital in July for non-ST elevation MI.  Ischemia work-up was not done at that time due to patient also had COVID-19 infection.  Patient was treated with ACS medication and discharged home, and the plan was to do with ischemia work-up as outpatient.  But it appeared patient never follow-up with cardiology since last admission.  Starting from 1 to 2 weeks ago, patient started to feel increasing shortness of breath, patient does have a baseline exertional became worse and for the last which she attributed to her COPD problem. Gradually her dyspnea issue became worse, and for the last 2 nights, she was not able to sleep flat on the bed.  She has mild cough, no phlegm production, no fever chills no chest pains.  She also complains about occasionally feeling lightheadedness, but denied any palpitations sweating nausea.  Interim history Admitted for CHF exacerbation with symptomatic bradycardia and AV block, hypertensive emergency.  Cardiology consulted and following. Assessment & Plan   Acute on chronic diastolic CHF exacerbation -Patient presented with fluid overload with hypertensive emergency -Echocardiogram 07/10/2020 showed an EF of 50 to 81%, grade 2 diastolic dysfunction -BNP 3006.4; chest x-ray with pulmonary edema -Patient given IV Lasix, currently 20 mg IV twice daily -Monitor intake and output, daily weights  Symptomatic bradycardia with second-degree AV block -Suspect secondary to Coreg toxicity -Patient given IV calcium gluconate -Cardiology  consulted and appreciated-Cardiology felt that given advanced age and poor functional status, patient is not a candidate for pacemaker placement.  EP to see patient. -Pending further recommendation  Hypertensive emergency -Continue IV hydralazine  CKD, stage IV -Continue to monitor creatinine closely given IV diuresis-patient follows with nephrology as an outpatient, currently medically managing  COPD -No signs of acute exacerbation -Continue inhalers as needed  Hypothyroidism -Continue Synthroid -TSH 3.855  Hyperlipidemia -Continue statin  DVT Prophylaxis Heparin  Code Status: DNR  Family Communication: None at bedside  Disposition Plan:  Status is: Inpatient  Remains inpatient appropriate because:Hemodynamically unstable   Dispo: The patient is from: ALF              Anticipated d/c is to: ALF              Anticipated d/c date is: > 3 days              Patient currently is not medically stable to d/c.   Consultants Cardiology EP  Procedures  None  Antibiotics   Anti-infectives (From admission, onward)   Start     Dose/Rate Route Frequency Ordered Stop   11/26/20 1200  gentamicin (GARAMYCIN) 80 mg in sodium chloride 0.9 % 500 mL irrigation        80 mg Irrigation On call 11/26/20 0931 11/27/20 1200   11/26/20 1200  ceFAZolin (ANCEF) IVPB 2g/100 mL premix        2 g 200 mL/hr over 30 Minutes Intravenous On call 11/26/20 0931 11/27/20 1200      Subjective:   Lisa Cortez seen and examined today.  Patient with no complaints this morning.  Patient denies current chest pain  or shortness of breath, dizziness or headache this time.  She does state that she has been somewhat short of breath and weak prior to admission.  Currently she denies any abdominal pain, nausea or vomiting.  Objective:   Vitals:   11/26/20 0056 11/26/20 0255 11/26/20 0400 11/26/20 0823  BP: (!) 164/61 (!) 164/61 (!) 228/57   Pulse: (!) 42 (!) 44 (!) 52 77  Resp: 19 18 14  (!) 22  Temp:  97.9 F (36.6 C)  98.5 F (36.9 C)   TempSrc: Oral  Oral   SpO2: 92% 95% 93% 92%  Weight:   50.8 kg   Height:   4\' 11"  (1.499 m)     Intake/Output Summary (Last 24 hours) at 11/26/2020 1113 Last data filed at 11/26/2020 1191 Gross per 24 hour  Intake 480 ml  Output --  Net 480 ml   Filed Weights   11/25/20 1209 11/26/20 0400  Weight: 62 kg 50.8 kg    Exam  General: Well developed, elderly, NAD  HEENT: NCAT, mucous membranes moist.   Cardiovascular: S1 S2 auscultated, bradycardic, no murmur  Respiratory: Clear to auscultation bilaterally   Abdomen: Soft, nontender, nondistended, + bowel sounds  Extremities: warm dry without cyanosis clubbing or edema  Neuro: AAOx3, hard of hearing otherwise nonfocal  Psych: appropriate mood and affect, pleasant   Data Reviewed: I have personally reviewed following labs and imaging studies  CBC: Recent Labs  Lab 11/25/20 0926  WBC 5.7  HGB 12.1  HCT 39.1  MCV 101.6*  PLT 478   Basic Metabolic Panel: Recent Labs  Lab 11/25/20 0926 11/25/20 1246 11/25/20 2056 11/26/20 0116  NA 141  --   --  141  K 4.4  --   --  3.9  CL 108  --   --  107  CO2 16*  --   --  20*  GLUCOSE 91  --   --  90  BUN 34*  --   --  37*  CREATININE 2.05*  --   --  2.15*  CALCIUM 9.2  --   --  9.1  MG  --  2.2 1.9  --   PHOS  --   --  3.5  --    GFR: Estimated Creatinine Clearance: 11.1 mL/min (A) (by C-G formula based on SCr of 2.15 mg/dL (H)). Liver Function Tests: No results for input(s): AST, ALT, ALKPHOS, BILITOT, PROT, ALBUMIN in the last 168 hours. No results for input(s): LIPASE, AMYLASE in the last 168 hours. No results for input(s): AMMONIA in the last 168 hours. Coagulation Profile: No results for input(s): INR, PROTIME in the last 168 hours. Cardiac Enzymes: No results for input(s): CKTOTAL, CKMB, CKMBINDEX, TROPONINI in the last 168 hours. BNP (last 3 results) No results for input(s): PROBNP in the last 8760 hours. HbA1C: No  results for input(s): HGBA1C in the last 72 hours. CBG: No results for input(s): GLUCAP in the last 168 hours. Lipid Profile: No results for input(s): CHOL, HDL, LDLCALC, TRIG, CHOLHDL, LDLDIRECT in the last 72 hours. Thyroid Function Tests: Recent Labs    11/25/20 2056  TSH 3.855   Anemia Panel: No results for input(s): VITAMINB12, FOLATE, FERRITIN, TIBC, IRON, RETICCTPCT in the last 72 hours. Urine analysis: No results found for: COLORURINE, APPEARANCEUR, LABSPEC, PHURINE, GLUCOSEU, HGBUR, BILIRUBINUR, KETONESUR, PROTEINUR, UROBILINOGEN, NITRITE, LEUKOCYTESUR Sepsis Labs: @LABRCNTIP (procalcitonin:4,lacticidven:4)  )No results found for this or any previous visit (from the past 240 hour(s)).    Radiology Studies: DG Chest 1  View  Result Date: 11/26/2020 CLINICAL DATA:  CHF EXAM: CHEST  1 VIEW COMPARISON:  11/25/2020 FINDINGS: Cardiomegaly. Mild vascular congestion, perihilar and lower lobe airspace opacities, improving since prior study. Possible small bilateral effusions, also improved. No acute bony abnormality. IMPRESSION: Improving bilateral lower lobe airspace opacities and effusions, likely improving edema. Electronically Signed   By: Rolm Baptise M.D.   On: 11/26/2020 07:26   DG Chest 2 View  Result Date: 11/25/2020 CLINICAL DATA:  Chest pain, shortness of breath. EXAM: CHEST - 2 VIEW COMPARISON:  October 31, 2020. FINDINGS: Stable cardiomegaly. Increased bilateral lung opacities are noted concerning for worsening edema or multifocal pneumonia. Small bilateral pleural effusions are noted. No pneumothorax is noted. Bony thorax is unremarkable. IMPRESSION: Increased bilateral lung opacities are noted concerning for worsening edema or multifocal pneumonia. Aortic Atherosclerosis (ICD10-I70.0). Electronically Signed   By: Marijo Conception M.D.   On: 11/25/2020 09:53   DG Ankle Complete Left  Result Date: 11/25/2020 CLINICAL DATA:  Acute left ankle pain and swelling after injury  several days ago. EXAM: LEFT ANKLE COMPLETE - 3+ VIEW COMPARISON:  None. FINDINGS: There is no evidence of fracture, dislocation, or joint effusion. There is no evidence of arthropathy or other focal bone abnormality. Soft tissues are unremarkable. IMPRESSION: Negative. Electronically Signed   By: Marijo Conception M.D.   On: 11/25/2020 13:57     Scheduled Meds: . aspirin EC  81 mg Oral Daily  . atorvastatin  10 mg Oral Daily  . chlorhexidine  60 mL Topical Once  . fluticasone furoate-vilanterol  1 puff Inhalation Daily  . furosemide  20 mg Intravenous BID  . gentamicin irrigation  80 mg Irrigation On Call  . heparin  5,000 Units Subcutaneous Q12H  . isosorbide mononitrate  30 mg Oral Daily  . levothyroxine  75 mcg Oral QAC breakfast  . memantine  5 mg Oral Daily  . montelukast  10 mg Oral Daily  . pantoprazole  40 mg Oral BID  . pramipexole  0.25 mg Oral Daily  . sodium bicarbonate  1,300 mg Oral BID  . sodium chloride flush  3 mL Intravenous Q12H  . vitamin B-12  1,000 mcg Oral Daily   Continuous Infusions: . sodium chloride    . sodium chloride    . sodium chloride    .  ceFAZolin (ANCEF) IV       LOS: 1 day   Time Spent in minutes   45 minutes  Charli Halle D.O. on 11/26/2020 at 11:13 AM  Between 7am to 7pm - Please see pager noted on [email or web address]  After 7pm go to [email or web address]  And look for the night coverage person covering for me after hours  [facility] Office  [phone]

## 2020-11-26 NOTE — Progress Notes (Signed)
PHARMACY NOTE:  ANTIMICROBIAL RENAL DOSAGE ADJUSTMENT  Current antimicrobial regimen includes a mismatch between antimicrobial dosage and estimated renal function.  As per policy approved by the Pharmacy & Therapeutics and Medical Executive Committees, the antimicrobial dosage will be adjusted accordingly.  Current antimicrobial dosage:  Cefazolin 1gm Iv q6h x 3  Indication: surgical prophylaxis  Renal Function:  Estimated Creatinine Clearance: 11.1 mL/min (A) (by C-G formula based on SCr of 2.15 mg/dL (H)). []      On intermittent HD, scheduled: []      On CRRT    Antimicrobial dosage has been changed to:  Cefazolin 1gm Iv q24h x 1  Additional comments:   Triva Hueber A. Levada Dy, PharmD, BCPS, FNKF Clinical Pharmacist [place] Please utilize Amion for appropriate phone number to reach the unit pharmacist ([place])   11/26/2020 8:09 PM

## 2020-11-26 NOTE — Consult Note (Addendum)
[facility]   ELECTROPHYSIOLOGY CONSULT NOTE    Patient ID: Lisa Cortez MRN: [id number], DOB/AGE: [birth year] 86 y.o.  Admit date: 11/25/2020 Date of Consult: 11/26/2020  Primary Physician: Lajean Manes, MD Primary Cardiologist: No primary care provider on file.  Electrophysiologist:  New to Dr. Curt Bears  Referring Provider: Dr. Angelena Form  Patient Profile: Lisa Cortez is a 86 y.o. female with a history of anemia, COPD, CKD stage 4, HLD, chronic diastolic CHF, HTN, memory issues and hypothyroidism who is being seen today for the evaluation of bradycardia at the request of Dr. Angelena Form.   HPI:  [name] is a 86 y.o. female with medical history as above.   She presented to [facility] with dypsnea and orthopnea. Found to be in 2:1 AV block with HRs in the 40s. Coreg held. Echo 06/2020 showed normal LVEF with asymmetric LVH, normal RV. CXR with + pulmonary edema and + BNP 3006. Diuresed overnight.   This morning she is feeling somewhat better. States she has recently had fatigue and SOB. Lives at [place] living in an apartment. Her breathing has improved after dose of lasix.   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   unknown   aspirin EC 81 MG tablet Take 1 tablet (81 mg total) by mouth daily. 30 tablet 11 11/25/2020 at Unknown time   atorvastatin (LIPITOR) 10  MG tablet Take 10 mg by mouth daily.    11/25/2020 at Unknown time   carvedilol (COREG) 3.125 MG tablet Take 1 tablet (3.125 mg total) by mouth 2 (two) times daily with a meal. 60 tablet 0 11/25/2020 at am   Cyanocobalamin (B-12) 2000 MCG TABS Take 1,000 mcg by mouth daily.   11/25/2020 at Unknown time   Fluticasone-Salmeterol (ADVAIR) 250-50 MCG/DOSE AEPB Inhale 1 puff into the lungs 2 (two) times daily.   11/25/2020 at Unknown time   isosorbide mononitrate (IMDUR) 30 MG 24 hr tablet Take 1 tablet (30 mg total) by mouth daily. 30 tablet 0 11/25/2020 at Unknown time   levothyroxine (SYNTHROID) 75 MCG tablet Take 75 mcg by mouth daily.   11/25/2020 at Unknown time   melatonin 3 MG TABS tablet Take 3 mg by mouth at bedtime as needed for sleep.   Past Week at Unknown time   memantine (NAMENDA) 5 MG tablet Take 5 mg by mouth 2 (two) times daily.    11/25/2020 at Unknown time   montelukast (SINGULAIR) 10 MG tablet Take 10 mg by mouth daily.   11/25/2020 at Unknown time   nitroGLYCERIN (NITROSTAT) 0.4 MG SL tablet Place 0.4 mg under the tongue every 5 (five) minutes as needed for chest pain.    unknown   pantoprazole (PROTONIX) 40 MG tablet Take 1 tablet (40 mg total) by mouth 2 (two) times  daily. 60 tablet 0 11/25/2020 at Unknown time   pramipexole (MIRAPEX) 0.25 MG tablet Take 0.25 mg by mouth daily.    11/25/2020 at Unknown time   sodium bicarbonate 650 MG tablet Take 1,300 mg by mouth 2 (two) times daily.    11/25/2020 at Unknown time   traMADol (ULTRAM) 50 MG tablet Take 50 mg by mouth daily as needed for pain.   Past Week at Unknown time   oxyCODONE (ROXICODONE) 5 MG immediate release tablet Take 1 tablet (5 mg total) by mouth every 4 (four) hours as needed for severe pain. (Patient not taking: Reported on 11/25/2020) 20 tablet 0 Not Taking at Unknown time    Inpatient Medications:   aspirin EC  81 mg Oral Daily   atorvastatin  10 mg Oral Daily   fluticasone furoate-vilanterol  1 puff Inhalation Daily    furosemide  20 mg Intravenous BID   heparin  5,000 Units Subcutaneous Q12H   isosorbide mononitrate  30 mg Oral Daily   levothyroxine  75 mcg Oral QAC breakfast   memantine  5 mg Oral Daily   montelukast  10 mg Oral Daily   pantoprazole  40 mg Oral BID   pramipexole  0.25 mg Oral Daily   sodium bicarbonate  1,300 mg Oral BID   sodium chloride flush  3 mL Intravenous Q12H   vitamin B-12  1,000 mcg Oral Daily    Allergies:  Allergies  Allergen Reactions   Tenex [Guanfacine Hcl] Other (See Comments)    unknown   Calcitonin Other (See Comments)    unknown   Epoetin Alfa Other (See Comments)   Fosamax [Alendronate Sodium] Other (See Comments)    unknown   Gatifloxacin Other (See Comments)    unknown   Procrit [Epoetin (Alfa)] Other (See Comments)    unknown   Verapamil Other (See Comments)    unknown   Atorvastatin Other (See Comments)    2011: caused elevated liver function tests    Social History   Socioeconomic History   Marital status: Single    Spouse name: Not on file   Number of children: Not on file   Years of education: Not on file   Highest education level: Not on file  Occupational History   Not on file  Tobacco Use   Smoking status: Former Smoker   Smokeless tobacco: Never Used  Scientific laboratory technician Use: Never used  Substance and Sexual Activity   Alcohol use: Yes    Comment: 1/2 glass of wine night   Drug use: Never   Sexual activity: Not on file  Other Topics Concern   Not on file  Social History Narrative   Not on file   Social Determinants of Health   Financial Resource Strain:    Difficulty of Paying Living Expenses: Not on file  Food Insecurity:    Worried About Running Out of Food in the Last Year: Not on file   Ran Out of Food in the Last Year: Not on file  Transportation Needs:    Lack of Transportation (Medical): Not on file   Lack of Transportation (Non-Medical): Not on file  Physical Activity:    Days of Exercise per Week: Not on  file   Minutes of Exercise per Session: Not on file  Stress:    Feeling of Stress : Not on file  Social Connections:    Frequency of Communication with Friends and Family: Not on file   Frequency of Social Gatherings  with Friends and Family: Not on file   Attends Religious Services: Not on file   Active Member of Clubs or Organizations: Not on file   Attends Archivist Meetings: Not on file   Marital Status: Not on file  Intimate Partner Violence:    Fear of Current or Ex-Partner: Not on file   Emotionally Abused: Not on file   Physically Abused: Not on file   Sexually Abused: Not on file     Family History  Problem Relation Age of Onset   CVA Mother    Bone cancer Brother    Dementia Brother      Review of Systems: All other systems reviewed and are otherwise negative except as noted above.  Physical Exam: Vitals:   11/25/20 2022 11/26/20 0056 11/26/20 0255 11/26/20 0400  BP:  (!) 164/61 (!) 164/61 (!) 228/57  Pulse: (!) 49 (!) 42 (!) 44 (!) 52  Resp: (!) 24 19 18 14   Temp:  97.9 F (36.6 C)  98.5 F (36.9 C)  TempSrc:  Oral  Oral  SpO2: 99% 92% 95% 93%  Weight:    50.8 kg  Height:    4\' 11"  (1.499 m)    GEN- The patient is well appearing, alert and oriented x 3 today.   HEENT: normocephalic, atraumatic; sclera clear, conjunctiva pink; hearing intact; oropharynx clear; neck supple Lungs- Clear to ausculation bilaterally, normal work of breathing.  No wheezes, rales, rhonchi Heart- Slow rate and rhythm, no murmurs, rubs or gallops GI- soft, non-tender, non-distended, bowel sounds present Extremities- no clubbing, cyanosis, or edema; DP/PT/radial pulses 2+ bilaterally MS- no significant deformity or atrophy Skin- warm and dry, no rash or lesion Psych- euthymic mood, full affect Neuro- strength and sensation are intact  Labs:   Lab Results  Component Value Date   WBC 5.7 11/25/2020   HGB 12.1 11/25/2020   HCT 39.1 11/25/2020   MCV 101.6 (H)  11/25/2020   PLT 194 11/25/2020    Recent Labs  Lab 11/26/20 0116  NA 141  K 3.9  CL 107  CO2 20*  BUN 37*  CREATININE 2.15*  CALCIUM 9.1  GLUCOSE 90      Radiology/Studies: DG Chest 1 View  Result Date: 11/26/2020 CLINICAL DATA:  CHF EXAM: CHEST  1 VIEW COMPARISON:  11/25/2020 FINDINGS: Cardiomegaly. Mild vascular congestion, perihilar and lower lobe airspace opacities, improving since prior study. Possible small bilateral effusions, also improved. No acute bony abnormality. IMPRESSION: Improving bilateral lower lobe airspace opacities and effusions, likely improving edema. Electronically Signed   By: Rolm Baptise M.D.   On: 11/26/2020 07:26   DG Chest 2 View  Result Date: 11/25/2020 CLINICAL DATA:  Chest pain, shortness of breath. EXAM: CHEST - 2 VIEW COMPARISON:  October 31, 2020. FINDINGS: Stable cardiomegaly. Increased bilateral lung opacities are noted concerning for worsening edema or multifocal pneumonia. Small bilateral pleural effusions are noted. No pneumothorax is noted. Bony thorax is unremarkable. IMPRESSION: Increased bilateral lung opacities are noted concerning for worsening edema or multifocal pneumonia. Aortic Atherosclerosis (ICD10-I70.0). Electronically Signed   By: Marijo Conception M.D.   On: 11/25/2020 09:53   DG Ribs Unilateral W/Chest Right  Result Date: 10/31/2020 CLINICAL DATA:  Right chest pain for 2 weeks. EXAM: RIGHT RIBS AND CHEST - 3+ VIEW COMPARISON:  07/13/2020 FINDINGS: Mildly displaced lateral right sixth rib fracture. No pneumothorax or hemothorax. Aeration is improved from prior although there is baseline generalized interstitial coarsening. Chronic cardiomegaly. IMPRESSION: Lateral right 6th  rib fracture.  No pneumothorax. Electronically Signed   By: Monte Fantasia M.D.   On: 10/31/2020 06:14   DG Shoulder Right  Result Date: 10/31/2020 CLINICAL DATA:  Right shoulder pain. EXAM: RIGHT SHOULDER - 2+ VIEW COMPARISON:  None. FINDINGS: The joint  spaces are maintained. No acute fracture or bone lesion. The visualized right ribs are intact and the visualized right lung is clear. IMPRESSION: No acute bony findings. Electronically Signed   By: Marijo Sanes M.D.   On: 10/31/2020 06:13   DG Ankle Complete Left  Result Date: 11/25/2020 CLINICAL DATA:  Acute left ankle pain and swelling after injury several days ago. EXAM: LEFT ANKLE COMPLETE - 3+ VIEW COMPARISON:  None. FINDINGS: There is no evidence of fracture, dislocation, or joint effusion. There is no evidence of arthropathy or other focal bone abnormality. Soft tissues are unremarkable. IMPRESSION: Negative. Electronically Signed   By: Marijo Conception M.D.   On: 11/25/2020 13:57   EKG: 2nd degree (2:1) AV block at 40 bpm (personally reviewed)  TELEMETRY: 2nd degree AV block with rates in 40s (personally reviewed)  Assessment/Plan: 1.  Second degree AV block Persistent after coreg stopped.  She has noted recent fatigue and dyspnea, suspect related.  Explained risks, benefits, and alternatives to PPM implantation, including but not limited to bleeding, infection, pneumothorax, pericardial effusion, lead dislodgement, heart attack, stroke, or death.  Pt verbalized understanding and agrees to proceed. I Tonda Wiederhold also discuss with her daughter.   2. HTN urgency ? In setting of HB.  Hydralazine prn.  Mayeli Bornhorst be able to adjust medications more freely s/p PPM.   3. Acute on chronic diastolic CHF Celestine Prim gently hydrate for PPM as she is now NPO  For questions or updates, please contact [place] Please consult [email or web address] for contact info under Cardiology/STEMI.  Signed, Shirley Friar, PA-C  11/26/2020 9:00 AM      I have seen and examined this patient with Oda Kilts.  Agree with above, note added to reflect my findings.  On exam, bradycardic, no murmurs. Patient with 2:1 AV block. No reversible causes. Plan for pacemaker today.   Pascuala Klutts has presented today for  surgery, with the diagnosis of second degree AV block.  The various methods of treatment have been discussed with the patient and family. After consideration of risks, benefits and other options for treatment, the patient has consented to  Procedure(s): Pacemaker implant as a surgical intervention .  Risks include but not limited to bleeding, infection, pneumothorax, perforation, tamponade, vascular damage, renal failure, MI, stroke, death, and lead dislodgement . The patient's history has been reviewed, patient examined, no change in status, stable for surgery.  I have reviewed the patient's chart and labs.  Questions were answered to the patient's satisfaction.    Elison Worrel Curt Bears, MD 11/26/2020 2:15 PM     Keyvin Rison M. Ronin Crager MD 11/26/2020 2:15 PM

## 2020-11-27 ENCOUNTER — Encounter (HOSPITAL_COMMUNITY): Payer: Self-pay | Admitting: Cardiology

## 2020-11-27 ENCOUNTER — Inpatient Hospital Stay (HOSPITAL_COMMUNITY): Payer: Medicare Other

## 2020-11-27 LAB — CBC
HCT: 40.2 % (ref 36.0–46.0)
Hemoglobin: 12.6 g/dL (ref 12.0–15.0)
MCH: 30.7 pg (ref 26.0–34.0)
MCHC: 31.3 g/dL (ref 30.0–36.0)
MCV: 97.8 fL (ref 80.0–100.0)
Platelets: 161 10*3/uL (ref 150–400)
RBC: 4.11 MIL/uL (ref 3.87–5.11)
RDW: 15.7 % — ABNORMAL HIGH (ref 11.5–15.5)
WBC: 6.5 10*3/uL (ref 4.0–10.5)
nRBC: 0 % (ref 0.0–0.2)

## 2020-11-27 LAB — BASIC METABOLIC PANEL
Anion gap: 14 (ref 5–15)
BUN: 46 mg/dL — ABNORMAL HIGH (ref 8–23)
CO2: 22 mmol/L (ref 22–32)
Calcium: 8.9 mg/dL (ref 8.9–10.3)
Chloride: 103 mmol/L (ref 98–111)
Creatinine, Ser: 2.64 mg/dL — ABNORMAL HIGH (ref 0.44–1.00)
GFR, Estimated: 16 mL/min — ABNORMAL LOW (ref >60)
Glucose, Bld: 80 mg/dL (ref 70–99)
Potassium: 3.8 mmol/L (ref 3.5–5.1)
Sodium: 139 mmol/L (ref 135–145)

## 2020-11-27 MED ORDER — CARVEDILOL 6.25 MG PO TABS
6.2500 mg | ORAL_TABLET | Freq: Two times a day (BID) | ORAL | Status: DC
  Administered 2020-11-27: 6.25 mg via ORAL
  Filled 2020-11-27: qty 1

## 2020-11-27 MED ORDER — FUROSEMIDE 20 MG PO TABS: 20.0000 mg | ORAL_TABLET | Freq: Every day | ORAL | 1 refills | Status: AC | PRN

## 2020-11-27 MED ORDER — FUROSEMIDE 10 MG/ML IJ SOLN
20.0000 mg | Freq: Every day | INTRAMUSCULAR | Status: DC
  Administered 2020-11-27: 20 mg via INTRAVENOUS
  Filled 2020-11-27 (×2): qty 2

## 2020-11-27 MED ORDER — HEPARIN SODIUM (PORCINE) 5000 UNIT/ML IJ SOLN: 5000.0000 [IU] | Freq: Three times a day (TID) | INTRAMUSCULAR | Status: DC

## 2020-11-27 MED ORDER — CARVEDILOL 6.25 MG PO TABS
6.2500 mg | ORAL_TABLET | Freq: Two times a day (BID) | ORAL | 1 refills | Status: DC
Start: 2020-11-27 — End: 2021-10-12

## 2020-11-27 NOTE — NC FL2 (Signed)
[place] LEVEL OF CARE SCREENING TOOL     IDENTIFICATION  Patient Name: Lisa Cortez Birthdate: 1925-12-01 Sex: female Admission Date (Current Location): 11/25/2020  [facility] and [place] Number:  Herbalist and Address:         Provider Number: [id number]  Attending Physician Name and Address:  Cristal Ford, DO  Relative Name and Phone Number:  Franchot Gallo (Daughter)   [phone] [facility])    Current Level of Care: Hospital Recommended Level of Care: [place] Prior Approval Number:    Date Approved/Denied:   PASRR Number:    Discharge Plan: Other (Comment) (ALF)    Current Diagnoses: Patient Active Problem List   Diagnosis Date Noted  . AV block, 3rd degree ([place]) 11/25/2020  . CHF (congestive heart failure) ([place]) 11/25/2020  . NSTEMI (non-ST elevated myocardial infarction) ([place]) 07/09/2020  . Chronic kidney disease (CKD), stage IV (severe) ([place])   . Hyperlipidemia   . Hypothyroidism   . Normocytic anemia   . Transaminitis   . Diastolic dysfunction   . Acute respiratory disease due to COVID-19 virus   . Sepsis ([place]) 08/20/2019  . Pneumonia 08/20/2019  . COPD with acute exacerbation ([place]) 08/20/2019  . Acute on chronic respiratory failure with hypoxia ([place]) 08/20/2019  . Chest pain 08/20/2019  . Nausea and vomiting 08/20/2019  . Macrocytic anemia 08/20/2019  . Hypertension 08/20/2019  . 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 08/20/2019    Orientation RESPIRATION BLADDER Height & Weight     Self,Time,Situation,Place  Normal Incontinent Weight: 113 lb 15.7 oz (51.7 kg) Height:  4\' 11"  (149.9 cm)  BEHAVIORAL SYMPTOMS/MOOD NEUROLOGICAL BOWEL NUTRITION STATUS      Incontinent  Heart Healthy  AMBULATORY STATUS COMMUNICATION OF NEEDS Skin   Limited Assist Verbally Normal                       Personal Care Assistance Level of Assistance  Bathing,Feeding,Dressing Bathing Assistance: Limited  assistance Feeding assistance: Independent Dressing Assistance: Limited assistance     Functional Limitations Info  Sight,Hearing,Speech Sight Info: Adequate Hearing Info: Impaired Speech Info: Adequate    SPECIAL CARE FACTORS FREQUENCY  PT (By licensed PT),OT (By licensed OT)     PT Frequency: 2x/week OT Frequency: 2x/week            Contractures Contractures Info: Not present    Additional Factors Info  Code Status,Allergies Code Status Info: DNR Allergies Info: Calcitonin, Tenex, Epoetin Alfa, Fosamax, Gatifloxacin, Procrit, Verapamil, Atorvastatin           Medication List    STOP taking these medications   oxyCODONE 5 MG immediate release tablet Commonly known as: Roxicodone     TAKE these medications   albuterol 108 (90 Base) MCG/ACT inhaler Commonly known as: VENTOLIN HFA Inhale 2 puffs into the lungs every 4 (four) hours as needed.   aspirin EC 81 MG tablet Take 1 tablet (81 mg total) by mouth daily.   atorvastatin 10 MG tablet Commonly known as: LIPITOR Take 10 mg by mouth daily.   B-12 2000 MCG Tabs Take 1,000 mcg by mouth daily.   carvedilol 6.25 MG tablet Commonly known as: COREG Take 1 tablet (6.25 mg total) by mouth 2 (two) times daily with a meal. What changed:   medication strength  how much to take   Fluticasone-Salmeterol 250-50 MCG/DOSE Aepb Commonly known as: ADVAIR Inhale 1 puff into the lungs 2 (two) times daily.   furosemide 20  MG tablet Commonly known as: Lasix Take 1 tablet (20 mg total) by mouth daily as needed for fluid or edema (weight gain >3lbs).   isosorbide mononitrate 30 MG 24 hr tablet Commonly known as: IMDUR Take 1 tablet (30 mg total) by mouth daily.   levothyroxine 75 MCG tablet Commonly known as: SYNTHROID Take 75 mcg by mouth daily.   melatonin 3 MG Tabs tablet Take 3 mg by mouth at bedtime as needed for sleep.   memantine 5 MG tablet Commonly known as: NAMENDA Take 5 mg by mouth  2 (two) times daily.   montelukast 10 MG tablet Commonly known as: SINGULAIR Take 10 mg by mouth daily.   nitroGLYCERIN 0.4 MG SL tablet Commonly known as: NITROSTAT Place 0.4 mg under the tongue every 5 (five) minutes as needed for chest pain.   pantoprazole 40 MG tablet Commonly known as: Protonix Take 1 tablet (40 mg total) by mouth 2 (two) times daily.   pramipexole 0.25 MG tablet Commonly known as: MIRAPEX Take 0.25 mg by mouth daily.   sodium bicarbonate 650 MG tablet Take 1,300 mg by mouth 2 (two) times daily.   traMADol 50 MG tablet Commonly known as: ULTRAM Take 50 mg by mouth daily as needed for pain.     Discharge Medications: Please see discharge summary for a list of discharge medications.  Relevant Imaging Results:  Relevant Lab Results:   Additional Information SSN: Tecumseh Peightyn Roberson, LCSW

## 2020-11-27 NOTE — TOC Transition Note (Addendum)
Transition of Care [facility]) - CM/SW Discharge Note   Patient Details  Name: Avianna Moynahan MRN: [id number] Date of Birth: 18-Mar-1925  [facility]) CM/SW Contact:  Zenon Mayo, RN Phone Number: 11/27/2020, 2:51 PM   Clinical Narrative:    NCM received information from [place] CSW that [employer] uses [facility] for their residents.  NCM contacted [place], she states they do not cover the group number for this patient under [facility] mcr.  She states she is receiving call from [facility] right now,  She gave Angela Nevin my phone number and she called this NCM.  Angela Nevin states that [facility] onsite will give the patient therapy, and to fax order and demographics to [phone].  NCM faxed information.   Final next level of care: [place] Barriers to Discharge: No Barriers Identified   Patient Goals and CMS Choice        Discharge Placement                       Discharge Plan and Services                          HH Arranged: PT [place]:  ([place]- thru [place])     Representative spoke with at [place]: [place] ([place]) Interventions     Readmission Risk Interventions Readmission Risk Prevention Plan 08/24/2019  Transportation Screening Complete  HRI or [place] Complete  Social Work Consult for [place] Planning/Counseling Complete  Palliative Care Screening Not Applicable  Medication Review Press photographer) Complete

## 2020-11-27 NOTE — Progress Notes (Addendum)
Electrophysiology Rounding Note  Patient Name: Lisa Cortez Date of Encounter: 11/27/2020  Primary Cardiologist: No primary care provider on file. Electrophysiologist: Dr. Curt Bears   Subjective   The patient is doing well today.  At this time, the patient denies chest pain, shortness of breath, or any new concerns.  S/p St Jude Dual Chamber PPM 11/26/20 for symptomatic second degree AV block CXR today shows stable lead placement (RA lead noted to be posterior) and no pneumothorax.  Inpatient Medications    Scheduled Meds:  aspirin EC  81 mg Oral Daily   atorvastatin  10 mg Oral Daily   fluticasone furoate-vilanterol  1 puff Inhalation Daily   furosemide  20 mg Intravenous Daily   heparin  5,000 Units Subcutaneous Q12H   isosorbide mononitrate  30 mg Oral Daily   levothyroxine  75 mcg Oral QAC breakfast   memantine  5 mg Oral Daily   montelukast  10 mg Oral Daily   pantoprazole  40 mg Oral BID   pramipexole  0.25 mg Oral Daily   sodium bicarbonate  1,300 mg Oral BID   sodium chloride flush  3 mL Intravenous Q12H   vitamin B-12  1,000 mcg Oral Daily   Continuous Infusions:  sodium chloride     sodium chloride 50 mL/hr at 11/26/20 1137    ceFAZolin (ANCEF) IV     PRN Meds: sodium chloride, acetaminophen, albuterol, hydrALAZINE, ondansetron (ZOFRAN) IV, oxyCODONE, sodium chloride flush, zolpidem   Vital Signs    Vitals:   11/27/20 0000 11/27/20 0200 11/27/20 0416 11/27/20 0505  BP: (!) 153/80 128/74  (!) 171/84  Pulse: 84   78  Resp:      Temp: 98.2 F (36.8 C)   98.3 F (36.8 C)  TempSrc:    Oral  SpO2: 92%   95%  Weight:   51.7 kg   Height:        Intake/Output Summary (Last 24 hours) at 11/27/2020 0841 Last data filed at 11/27/2020 0834 Gross per 24 hour  Intake 603 ml  Output 520 ml  Net 83 ml   Filed Weights   11/25/20 1209 11/26/20 0400 11/27/20 0416  Weight: 62 kg 50.8 kg 51.7 kg    Physical Exam    GEN- The patient is well appearing, alert  and oriented x 3 today.   Head- normocephalic, atraumatic Eyes-  Sclera clear, conjunctiva pink Ears- hearing intact Oropharynx- clear Neck- supple Lungs- Clear to ausculation bilaterally, normal work of breathing Heart- Regular rate and rhythm, no murmurs, rubs or gallops GI- soft, NT, ND, + BS Extremities- no clubbing or cyanosis. No edema Skin- no rash or lesion Psych- euthymic mood, full affect Neuro- strength and sensation are intact  Labs    CBC Recent Labs    11/25/20 0926 11/27/20 0356  WBC 5.7 6.5  HGB 12.1 12.6  HCT 39.1 40.2  MCV 101.6* 97.8  PLT 194 607   Basic Metabolic Panel Recent Labs    11/25/20 1246 11/25/20 2056 11/26/20 0116 11/27/20 0356  NA  --   --  141 139  K  --   --  3.9 3.8  CL  --   --  107 103  CO2  --   --  20* 22  GLUCOSE  --   --  90 80  BUN  --   --  37* 46*  CREATININE  --   --  2.15* 2.64*  CALCIUM  --   --  9.1 8.9  MG 2.2 1.9  --   --   PHOS  --  3.5  --   --    Liver Function Tests No results for input(s): AST, ALT, ALKPHOS, BILITOT, PROT, ALBUMIN in the last 72 hours. No results for input(s): LIPASE, AMYLASE in the last 72 hours. Cardiac Enzymes No results for input(s): CKTOTAL, CKMB, CKMBINDEX, TROPONINI in the last 72 hours.   Telemetry    NSR 70-80s (personally reviewed)  Radiology    DG Chest 1 View  Result Date: 11/26/2020 CLINICAL DATA:  CHF EXAM: CHEST  1 VIEW COMPARISON:  11/25/2020 FINDINGS: Cardiomegaly. Mild vascular congestion, perihilar and lower lobe airspace opacities, improving since prior study. Possible small bilateral effusions, also improved. No acute bony abnormality. IMPRESSION: Improving bilateral lower lobe airspace opacities and effusions, likely improving edema. Electronically Signed   By: Rolm Baptise M.D.   On: 11/26/2020 07:26   DG Chest 2 View  Result Date: 11/27/2020 CLINICAL DATA:  Cardiac device in situ. EXAM: CHEST - 2 VIEW COMPARISON:  11/26/2020.  11/25/2020. FINDINGS: Cardiac  pacer with lead tips over the right atrium right ventricle. Stable cardiomegaly. Interim slight progression of bilateral interstitial prominence suggesting interstitial edema. Persistent small bilateral pleural effusions. No pneumothorax. IMPRESSION: 1. Cardiac pacer with lead tips over the right atrium and right ventricle. No pneumothorax. 2. Stable cardiomegaly. Interim slight progression of bilateral interstitial prominence suggesting interstitial edema. Small bilateral pleural effusions again noted. Electronically Signed   By: Marcello Moores  Register   On: 11/27/2020 06:14   DG Chest 2 View  Result Date: 11/25/2020 CLINICAL DATA:  Chest pain, shortness of breath. EXAM: CHEST - 2 VIEW COMPARISON:  October 31, 2020. FINDINGS: Stable cardiomegaly. Increased bilateral lung opacities are noted concerning for worsening edema or multifocal pneumonia. Small bilateral pleural effusions are noted. No pneumothorax is noted. Bony thorax is unremarkable. IMPRESSION: Increased bilateral lung opacities are noted concerning for worsening edema or multifocal pneumonia. Aortic Atherosclerosis (ICD10-I70.0). Electronically Signed   By: Marijo Conception M.D.   On: 11/25/2020 09:53   DG Ankle Complete Left  Result Date: 11/25/2020 CLINICAL DATA:  Acute left ankle pain and swelling after injury several days ago. EXAM: LEFT ANKLE COMPLETE - 3+ VIEW COMPARISON:  None. FINDINGS: There is no evidence of fracture, dislocation, or joint effusion. There is no evidence of arthropathy or other focal bone abnormality. Soft tissues are unremarkable. IMPRESSION: Negative. Electronically Signed   By: Marijo Conception M.D.   On: 11/25/2020 13:57   EP PPM/ICD IMPLANT  Result Date: 11/26/2020 SURGEON:  Allegra Lai, MD   PREPROCEDURE DIAGNOSIS:  Second degree AV block   POSTPROCEDURE DIAGNOSIS:  Second degree AV block    PROCEDURES:  1. Pacemaker implantation.   INTRODUCTION:  Lisa Cortez is a 86 y.o. female with a history of bradycardia who  presents today for pacemaker implantation.  The patient reports intermittent episodes of dizziness over the past few months.  No reversible causes have been identified.  The patient therefore presents today for pacemaker implantation.   DESCRIPTION OF PROCEDURE:  Informed written consent was obtained, and  the patient was brought to the electrophysiology lab in a fasting state.  The patient required no sedation for the procedure today.  The patients left chest was prepped and draped in the usual sterile fashion by the EP lab staff. The skin overlying the left deltopectoral region was infiltrated with lidocaine for local analgesia.  A 4-cm incision was made over the left deltopectoral region.  A  left subcutaneous pacemaker pocket was fashioned using a combination of sharp and blunt dissection. Electrocautery was required to assure hemostasis.  RA/RV Lead Placement: The left axillary vein was therefore cannulated.  Through the left axillary vein, a [employer] model Tendril MRI [id number] (serial number  [id number]) right atrial lead and a St Jude Medical model Tendril MRI [id number] (serial number  [id number]) right ventricular lead were advanced with fluoroscopic visualization into the right atrial appendage and right ventricular apex positions respectively.  Initial atrial lead P- waves measured 4.2 mV with impedance of 450 ohms and a threshold of 1 V at 0.5 msec.  Right ventricular lead R-waves measured 17.3 mV with an impedance of 704 ohms and a threshold of 0.5 V at 0.5 msec.  Both leads were secured to the pectoralis fascia using #2-0 silk over the suture sleeves. Device Placement:  The leads were then connected to a [place] MRI  model [id number] (serial number  [id number] ) pacemaker.  The pocket was irrigated with copious gentamicin solution.  The pacemaker was then placed into the pocket.  The pocket was then closed in 3 layers with 2.0 Vicryl suture for the 3.0 Vicryl suture subcutaneous and  subcuticular layers.  Steri-  Strips and a sterile dressing were then applied. EBL<46ml.  There were no early apparent complications.   CONCLUSIONS:  1. Successful implantation of a St Jude Medical Assurity MRI dual-chamber pacemaker for symptomatic bradycardia  2. No early apparent complications.       Dugan Vanhoesen Curt Bears, MD 11/26/2020 5:41 PM  ECHOCARDIOGRAM COMPLETE  Result Date: 11/26/2020    ECHOCARDIOGRAM REPORT   Patient Name:   Lisa Cortez Date of Exam: 11/26/2020 Medical Rec #:  [id number]    Height:       59.0 in Accession #:    [id number]   Weight:       112.0 lb Date of Birth:  1925-04-05     BSA:          1.441 m Patient Age:    32 years     BP:           228/57 mmHg Patient Gender: F            HR:           44 bpm. Exam Location:  Inpatient Procedure: 2D Echo Indications:    acute diastolic chf 829.56  History:        Patient has prior history of Echocardiogram examinations, most                 recent 07/10/2020. COPD and chronic kidney disease,                 Signs/Symptoms:Dyspnea; Risk Factors:Hypertension and                 Dyslipidemia.  Sonographer:    Johny Chess Referring Phys: [id number] [place]  1. Left ventricular ejection fraction, by estimation, is 60 to 65%. The left ventricle has normal function. The left ventricle has no regional wall motion abnormalities. There is moderate asymmetric left ventricular hypertrophy of the basal-septal segment. Left ventricular diastolic parameters are indeterminate.  2. Right ventricular systolic function is normal. The right ventricular size is normal. Tricuspid regurgitation signal is inadequate for assessing PA pressure.  3. Left atrial size was mildly dilated.  4. The mitral valve is abnormal. Trivial mitral valve regurgitation. Mild mitral stenosis. MG 46mmHg at 44 bpm. Severe mitral annular calcification.  5. The aortic valve is tricuspid. Aortic valve regurgitation is mild. Mild aortic valve stenosis. Vmax 2.2 m/s, MG 10 mmHg, AVA  1.4 cm^2, DI 0.6  6. The inferior vena cava is dilated in size with >50% respiratory variability, suggesting right atrial pressure of 8 mmHg. FINDINGS  Left Ventricle: Left ventricular ejection fraction, by estimation, is 60 to 65%. The left ventricle has normal function. The left ventricle has no regional wall motion abnormalities. The left ventricular internal cavity size was normal in size. There is  moderate asymmetric left ventricular hypertrophy of the basal-septal segment. Left ventricular diastolic parameters are indeterminate. Right Ventricle: The right ventricular size is normal. No increase in right ventricular wall thickness. Right ventricular systolic function is normal. Tricuspid regurgitation signal is inadequate for assessing PA pressure. Left Atrium: Left atrial size was mildly dilated. Right Atrium: Right atrial size was normal in size. Pericardium: There is no evidence of pericardial effusion. Mitral Valve: The mitral valve is abnormal. Severe mitral annular calcification. Trivial mitral valve regurgitation. Mild mitral valve stenosis. Tricuspid Valve: The tricuspid valve is normal in structure. Tricuspid valve regurgitation is trivial. Aortic Valve: The aortic valve is tricuspid. Aortic valve regurgitation is mild. Aortic regurgitation PHT measures 831 msec. Mild aortic stenosis is present. Aortic valve mean gradient measures 10.0 mmHg. Aortic valve peak gradient measures 20.1 mmHg. Aortic valve area, by VTI measures 1.36 cm. Pulmonic Valve: The pulmonic valve was not well visualized. Pulmonic valve regurgitation is trivial. Aorta: The aortic root and ascending aorta are structurally normal, with no evidence of dilitation. Venous: The inferior vena cava is dilated in size with greater than 50% respiratory variability, suggesting right atrial pressure of 8 mmHg. IAS/Shunts: The interatrial septum was not well visualized.  LEFT VENTRICLE PLAX 2D LVIDd:         4.90 cm  Diastology LVIDs:          2.40 cm  LV e' medial:    11.70 cm/s LV PW:         1.00 cm  LV E/e' medial:  15.7 LV IVS:        0.80 cm  LV e' lateral:   14.50 cm/s LVOT diam:     1.70 cm  LV E/e' lateral: 12.7 LV SV:         79 LV SV Index:   55 LVOT Area:     2.27 cm  RIGHT VENTRICLE             IVC RV S prime:     15.40 cm/s  IVC diam: 2.10 cm TAPSE (M-mode): 2.0 cm LEFT ATRIUM             Index       RIGHT ATRIUM           Index LA diam:        4.20 cm 2.91 cm/m  RA Area:     12.00 cm LA Vol (A2C):   56.2 ml 38.99 ml/m RA Volume:   25.00 ml  17.34 ml/m LA Vol (A4C):   52.5 ml 36.42 ml/m LA Biplane Vol: 56.7 ml 39.33 ml/m  AORTIC VALVE AV Area (Vmax):    1.19 cm AV Area (Vmean):   1.18 cm AV Area (VTI):     1.36 cm AV Vmax:           224.00 cm/s AV Vmean:          147.000 cm/s AV VTI:  0.578 m AV Peak Grad:      20.1 mmHg AV Mean Grad:      10.0 mmHg LVOT Vmax:         117.00 cm/s LVOT Vmean:        76.700 cm/s LVOT VTI:          0.347 m LVOT/AV VTI ratio: 0.60 AI PHT:            831 msec  AORTA Ao Root diam: 2.50 cm Ao Asc diam:  2.50 cm MITRAL VALVE MV Area (PHT): 5.54 cm     SHUNTS MV Decel Time: 137 msec     Systemic VTI:  0.35 m MV E velocity: 184.00 cm/s  Systemic Diam: 1.70 cm MV A velocity: 147.00 cm/s MV E/A ratio:  1.25 Lisa Milian MD Electronically signed by Lisa Milian MD Signature Date/Time: 11/26/2020/5:40:48 PM    Final     Patient Profile     Lisa Cortez is a 86 y.o. female with a history of anemia, COPD, CKD stage 4, HLD, chronic diastolic CHF, HTN, memory issues and hypothyroidism who is being seen today for the evaluation of bradycardia at the request of Dr. Angelena Form.   Assessment & Plan    1. Symptomatic 2nd degree AV block; 2:1 Did not improve off coreg S/p St Jude Dual Chamber PPM 11/26/20. Site stable today CXR today shows stable lead placement (RA lead noted to be posterior) and no pneumothorax.  2. HTN Ok to resume BB now s/p pacing. Lisa Cortez restart at 6.25 mg BID.    3. Acute on chronic diastolic CHF Per primary. Volume status improved from admission  Usual follow up and instructions placed in AVS. Koleen Celia review wound care with patient and family upon daughters arrival.   For questions or updates, please contact [facility] Please consult [email or web address] for contact info under Cardiology/STEMI.  Signed, Shirley Friar, PA-C  11/27/2020, 8:41 AM   I have seen and examined this patient with Oda Kilts.  Agree with above, note added to reflect my findings.  On exam, RRR, no murmurs.  She is status post Public house manager.  Device is functioning appropriately.  Chest x-ray and interrogation without issue.  We Craig Ionescu arrange for follow-up in EP clinic.  EP to sign off.  Do not hesitate to call back if any further issues arise.  Anairis Knick M. Keymarion Bearman MD 11/27/2020 9:38 AM

## 2020-11-27 NOTE — Discharge Instructions (Signed)
After Your Pacemaker   . You have a [employer] Pacemaker  ACTIVITY . Do not lift your arm above shoulder height for 1 week after your procedure. After 7 days, you may progress as below.  . You should remove your sling 24 hours after your procedure, unless otherwise instructed by your provider.     Thursday December 04, 2020  Friday December 05, 2020 Saturday December 06, 2020 Sunday December 07, 2020   . Do not lift, push, pull, or carry anything over 10 pounds with the affected arm until 6 weeks (Thursday January 08, 2021 ) after your procedure.   . Do NOT DRIVE until you have been seen for your wound check, or as long as instructed by your healthcare provider.   . Ask your healthcare provider when you can go back to work   INCISION/Dressing . If you are on a blood thinner such as Coumadin, Xarelto, Eliquis, Plavix, or Pradaxa please confirm with your provider when this should be resumed.   . If large square, outer bandage is left in place, this can be removed after 24 hours from your procedure. Do not remove steri-strips or glue as below.   . Monitor your Pacemaker site for redness, swelling, and drainage. Call the device clinic at [phone] if you experience these symptoms or fever/chills.  . If your incision is sealed with Steri-strips or staples, you may shower 10 days after your procedure or when told by your provider. Do not remove the steri-strips or let the shower hit directly on your site. You may wash around your site with soap and water.    Marland Kitchen Avoid lotions, ointments, or perfumes over your incision until it is well-healed.  . You may use a hot tub or a pool AFTER your wound check appointment if the incision is completely closed.  Marland Kitchen PAcemaker Alerts:  Some alerts are vibratory and others beep. These are NOT emergencies. Please call our office to let us know. If this occurs at night or on weekends, it can wait until the next business day. Send a remote transmission.  . If  your device is capable of reading fluid status (for heart failure), you will be offered monthly monitoring to review this with you.   DEVICE MANAGEMENT . Remote monitoring is used to monitor your pacemaker from home. This monitoring is scheduled every 91 days by our office. It allows [place] to keep an eye on the functioning of your device to ensure it is working properly. You will routinely see your Electrophysiologist annually (more often if necessary).   . You should receive your ID card for your new device in 4-8 weeks. Keep this card with you at all times once received. Consider wearing a medical alert bracelet or necklace.  . Your Pacemaker may be MRI compatible. This will be discussed at your next office visit/wound check.  You should avoid contact with strong electric or magnetic fields.    Do not use amateur (ham) radio equipment or electric (arc) welding torches. MP3 player headphones with magnets should not be used. Some devices are safe to use if held at least 12 inches (30 cm) from your Pacemaker. These include power tools, lawn mowers, and speakers. If you are unsure if something is safe to use, ask your health care provider.   When using your cell phone, hold it to the ear that is on the opposite side from the Pacemaker. Do not leave your cell phone in a pocket over the Pacemaker.  You may safely use electric blankets, heating pads, computers, and microwave ovens.  Call the office right away if:  You have chest pain.  You feel more short of breath than you have felt before.  You feel more light-headed than you have felt before.  Your incision starts to open up.  This information is not intended to replace advice given to you by your health care provider. Make sure you discuss any questions you have with your health care provider.

## 2020-11-27 NOTE — Progress Notes (Signed)
CSW spoke with [place] at [facility]. Clarified that pt is in ILF not ALF. Can return with [facility] if MD and pt deem appropriate. CSW confirmed with MD for return to [place] with [facility]. [name] partnered with [employer]. [facility] will arrange [facility] services.

## 2020-11-27 NOTE — Discharge Summary (Addendum)
Physician Discharge Summary  Lisa Cortez UTM:[id number] DOB: 1925/07/04 DOA: 11/25/2020  PCP: Lisa Manes, MD  Admit date: 11/25/2020 Discharge date: 11/27/2020  Time spent: 45 minutes  Recommendations for Outpatient Follow-up:  Patient will be discharged to home [facility]) with home health therapy.  Patient will need to follow up with primary care provider within one week of discharge, repeat BMP.  Follow up with cardiology and nephrology. Patient should continue medications as prescribed.  Patient should follow a heart healthy diet.   Discharge Diagnoses:  Acute on chronic diastolic CHF exacerbation Symptomatic bradycardia with second-degree AV block Hypertensive emergency CKD, stage IV COPD Hypothyroidism Hyperlipidemia  Discharge Condition: Stable  Diet recommendation: heart healthy  Filed Weights   11/25/20 1209 11/26/20 0400 11/27/20 0416  Weight: 62 kg 50.8 kg 51.7 kg    History of present illness:  on 11/25/2020 by Dr. Ted Mcalpine Cortez a 86 y.o.femalewith medical history significant ofNSTEMI (July 2021), HTN, COPD, CKD stage IV,hypothyroidism, presented with increasing shortness of breath and episodes of near syncope. [facility] by [facility]. Patient was admitted to hospital in July for non-ST elevation MI. Ischemia work-up was not done at that time due to patient also had COVID-19 infection. Patient was treated with ACS medication and discharged home, and the plan was to do with ischemia work-up as outpatient. But it appeared patient never follow-up with cardiology since last admission.  Starting from 1 to 2 weeks ago, patient started to feel increasing shortness of breath, patient does have a baseline exertional became worse and for the last which she attributed to her COPD problem. Gradually her dyspnea issue became worse, and for the last 2 nights, she was not able to sleep flat on the bed. She has mild cough, no phlegm  production, no fever chills no chest pains. She also complains about occasionally feeling lightheadedness, but denied any palpitations sweating nausea.  Hospital Course:  Acute on chronic diastolic CHF exacerbation -Patient presented with fluid overload with hypertensive emergency -Echocardiogram 07/10/2020 showed an EF of 50 to 56%, grade 2 diastolic dysfunction -BNP 3006.4; chest x-ray with pulmonary edema -Patient given IV Lasix -Monitor intake and output, daily weights -would continue lasix 20mg  daily PRN for edema or >3lb weight gain   Symptomatic bradycardia with second-degree AV block -Suspect secondary to Coreg toxicity -Patient given IV calcium gluconate -Cardiology consulted and appreciated-Cardiology felt that given advanced age and poor functional status, patient is not a candidate for pacemaker placement.  EP to see patient. -s/p St Jude Dual Chamber PPM 11/26/20 for symptomatic second degree AV block -Coreg restarted today   Hypertensive emergency -BP has improved  CKD, stage IV -Continue to monitor creatinine closely given IV diuresis-patient follows with nephrology as an outpatient, currently medically managing -Creatinine up to 2.64 (wanted to repeat creatinine later today, however patient and daughter will do this as an outpatient) -Discussed with daughter, would repeat BMP in one week  COPD -No signs of acute exacerbation -Continue inhalers as needed  Hypothyroidism -Continue Synthroid -TSH 3.855  Hyperlipidemia -Continue statin  Procedures: Placement of St Jude Dual Chamber PPM 11/26/20 for symptomatic second degree AV block  Consultations: Cardiology/EP  Code status: DNR  Discharge Exam: Vitals:   11/27/20 1056 11/27/20 1200  BP:  94/74  Pulse: 80 75  Resp:  18  Temp:    SpO2: 98% 95%     General: Well developed, elderly, NAD  HEENT: NCAT, mucous membranes moist.  Cardiovascular: S1 S2 auscultated,RRR  Respiratory: Clear to  auscultation bilaterally   Abdomen: Soft, nontender, nondistended, + bowel sounds  Extremities: warm dry without cyanosis clubbing or edema  Neuro: AAOx3, nonfocal  Psych: pleasant, appropriate mood and affect  Discharge Instructions Discharge Instructions    Discharge instructions   Complete by: As directed    Patient will be discharged to home [facility]) with home health therapy.  Patient will need to follow up with primary care provider within one week of discharge, repeat BMP.  Follow up with cardiology and nephrology. Patient should continue medications as prescribed.  Patient should follow a heart healthy diet.   Increase activity slowly   Complete by: As directed      Allergies as of 11/27/2020      Reactions   Tenex [guanfacine Hcl] Other (See Comments)   unknown   Calcitonin Other (See Comments)   unknown   Epoetin Alfa Other (See Comments)   Fosamax [alendronate Sodium] Other (See Comments)   unknown   Gatifloxacin Other (See Comments)   unknown   Procrit [epoetin (alfa)] Other (See Comments)   unknown   Verapamil Other (See Comments)   unknown   Atorvastatin Other (See Comments)   2011: caused elevated liver function tests      Medication List    STOP taking these medications   oxyCODONE 5 MG immediate release tablet Commonly known as: Roxicodone     TAKE these medications   albuterol 108 (90 Base) MCG/ACT inhaler Commonly known as: VENTOLIN HFA Inhale 2 puffs into the lungs every 4 (four) hours as needed.   aspirin EC 81 MG tablet Take 1 tablet (81 mg total) by mouth daily.   atorvastatin 10 MG tablet Commonly known as: LIPITOR Take 10 mg by mouth daily.   B-12 2000 MCG Tabs Take 1,000 mcg by mouth daily.   carvedilol 6.25 MG tablet Commonly known as: COREG Take 1 tablet (6.25 mg total) by mouth 2 (two) times daily with a meal. What changed:   medication strength  how much to take   Fluticasone-Salmeterol 250-50 MCG/DOSE Aepb Commonly  known as: ADVAIR Inhale 1 puff into the lungs 2 (two) times daily.   furosemide 20 MG tablet Commonly known as: Lasix Take 1 tablet (20 mg total) by mouth daily as needed for fluid or edema (weight gain >3lbs).   isosorbide mononitrate 30 MG 24 hr tablet Commonly known as: IMDUR Take 1 tablet (30 mg total) by mouth daily.   levothyroxine 75 MCG tablet Commonly known as: SYNTHROID Take 75 mcg by mouth daily.   melatonin 3 MG Tabs tablet Take 3 mg by mouth at bedtime as needed for sleep.   memantine 5 MG tablet Commonly known as: NAMENDA Take 5 mg by mouth 2 (two) times daily.   montelukast 10 MG tablet Commonly known as: SINGULAIR Take 10 mg by mouth daily.   nitroGLYCERIN 0.4 MG SL tablet Commonly known as: NITROSTAT Place 0.4 mg under the tongue every 5 (five) minutes as needed for chest pain.   pantoprazole 40 MG tablet Commonly known as: Protonix Take 1 tablet (40 mg total) by mouth 2 (two) times daily.   pramipexole 0.25 MG tablet Commonly known as: MIRAPEX Take 0.25 mg by mouth daily.   sodium bicarbonate 650 MG tablet Take 1,300 mg by mouth 2 (two) times daily.   traMADol 50 MG tablet Commonly known as: ULTRAM Take 50 mg by mouth daily as needed for pain.      Allergies  Allergen Reactions  . Tenex [Guanfacine Hcl] Other (  See Comments)    unknown  . Calcitonin Other (See Comments)    unknown  . Epoetin Alfa Other (See Comments)  . Fosamax [Alendronate Sodium] Other (See Comments)    unknown  . Gatifloxacin Other (See Comments)    unknown  . Procrit [Epoetin (Alfa)] Other (See Comments)    unknown  . Verapamil Other (See Comments)    unknown  . Atorvastatin Other (See Comments)    2011: caused elevated liver function tests    Follow-up Information    Constance Haw, MD Follow up on 03/16/2021.   Specialty: Cardiology Why: at 245 for 3 month pacemaker check Contact information: [address] [place]  [address] [phone]        [facility] Follow up on 12/09/2020.   Why: at 1120 for post pacemaker wound check Contact information: [place] [address] [phone]       Lisa Manes, MD. Schedule an appointment as soon as possible for a visit in 1 week(s).   Specialty: Internal Medicine Why: Hospital follow up Contact information: [address] [place] [address] [phone]                The results of significant diagnostics from this hospitalization (including imaging, microbiology, ancillary and laboratory) are listed below for reference.    Significant Diagnostic Studies: DG Chest 1 View  Result Date: 11/26/2020 CLINICAL DATA:  CHF EXAM: CHEST  1 VIEW COMPARISON:  11/25/2020 FINDINGS: Cardiomegaly. Mild vascular congestion, perihilar and lower lobe airspace opacities, improving since prior study. Possible small bilateral effusions, also improved. No acute bony abnormality. IMPRESSION: Improving bilateral lower lobe airspace opacities and effusions, likely improving edema. Electronically Signed   By: Rolm Baptise M.D.   On: 11/26/2020 07:26   DG Chest 2 View  Result Date: 11/27/2020 CLINICAL DATA:  Cardiac device in situ. EXAM: CHEST - 2 VIEW COMPARISON:  11/26/2020.  11/25/2020. FINDINGS: Cardiac pacer with lead tips over the right atrium right ventricle. Stable cardiomegaly. Interim slight progression of bilateral interstitial prominence suggesting interstitial edema. Persistent small bilateral pleural effusions. No pneumothorax. IMPRESSION: 1. Cardiac pacer with lead tips over the right atrium and right ventricle. No pneumothorax. 2. Stable cardiomegaly. Interim slight progression of bilateral interstitial prominence suggesting interstitial edema. Small bilateral pleural effusions again noted. Electronically Signed   By: Marcello Moores  Register   On: 11/27/2020  06:14   DG Chest 2 View  Result Date: 11/25/2020 CLINICAL DATA:  Chest pain, shortness of breath. EXAM: CHEST - 2 VIEW COMPARISON:  October 31, 2020. FINDINGS: Stable cardiomegaly. Increased bilateral lung opacities are noted concerning for worsening edema or multifocal pneumonia. Small bilateral pleural effusions are noted. No pneumothorax is noted. Bony thorax is unremarkable. IMPRESSION: Increased bilateral lung opacities are noted concerning for worsening edema or multifocal pneumonia. Aortic Atherosclerosis (ICD10-I70.0). Electronically Signed   By: Marijo Conception M.D.   On: 11/25/2020 09:53   DG Ribs Unilateral W/Chest Right  Result Date: 10/31/2020 CLINICAL DATA:  Right chest pain for 2 weeks. EXAM: RIGHT RIBS AND CHEST - 3+ VIEW COMPARISON:  07/13/2020 FINDINGS: Mildly displaced lateral right sixth rib fracture. No pneumothorax or hemothorax. Aeration is improved from prior although there is baseline generalized interstitial coarsening. Chronic cardiomegaly. IMPRESSION: Lateral right 6th rib fracture.  No pneumothorax. Electronically Signed   By: Monte Fantasia M.D.   On: 10/31/2020 06:14   DG Shoulder Right  Result  Date: 10/31/2020 CLINICAL DATA:  Right shoulder pain. EXAM: RIGHT SHOULDER - 2+ VIEW COMPARISON:  None. FINDINGS: The joint spaces are maintained. No acute fracture or bone lesion. The visualized right ribs are intact and the visualized right lung is clear. IMPRESSION: No acute bony findings. Electronically Signed   By: Marijo Sanes M.D.   On: 10/31/2020 06:13   DG Ankle Complete Left  Result Date: 11/25/2020 CLINICAL DATA:  Acute left ankle pain and swelling after injury several days ago. EXAM: LEFT ANKLE COMPLETE - 3+ VIEW COMPARISON:  None. FINDINGS: There is no evidence of fracture, dislocation, or joint effusion. There is no evidence of arthropathy or other focal bone abnormality. Soft tissues are unremarkable. IMPRESSION: Negative. Electronically Signed   By: Marijo Conception M.D.   On: 11/25/2020 13:57   EP PPM/ICD IMPLANT  Result Date: 11/26/2020 SURGEON:  Allegra Lai, MD   PREPROCEDURE DIAGNOSIS:  Second degree AV block   POSTPROCEDURE DIAGNOSIS:  Second degree AV block    PROCEDURES:  1. Pacemaker implantation.   INTRODUCTION:  Varetta Chavers is a 86 y.o. female with a history of bradycardia who presents today for pacemaker implantation.  The patient reports intermittent episodes of dizziness over the past few months.  No reversible causes have been identified.  The patient therefore presents today for pacemaker implantation.   DESCRIPTION OF PROCEDURE:  Informed written consent was obtained, and  the patient was brought to the electrophysiology lab in a fasting state.  The patient required no sedation for the procedure today.  The patients left chest was prepped and draped in the usual sterile fashion by the EP lab staff. The skin overlying the left deltopectoral region was infiltrated with lidocaine for local analgesia.  A 4-cm incision was made over the left deltopectoral region.  A left subcutaneous pacemaker pocket was fashioned using a combination of sharp and blunt dissection. Electrocautery was required to assure hemostasis.  RA/RV Lead Placement: The left axillary vein was therefore cannulated.  Through the left axillary vein, a [employer] model Tendril MRI [id number] (serial number  [id number]) right atrial lead and a St Jude Medical model Tendril MRI [id number] (serial number  [id number]) right ventricular lead were advanced with fluoroscopic visualization into the right atrial appendage and right ventricular apex positions respectively.  Initial atrial lead P- waves measured 4.2 mV with impedance of 450 ohms and a threshold of 1 V at 0.5 msec.  Right ventricular lead R-waves measured 17.3 mV with an impedance of 704 ohms and a threshold of 0.5 V at 0.5 msec.  Both leads were secured to the pectoralis fascia using #2-0 silk over the suture sleeves. Device  Placement:  The leads were then connected to a [place] MRI  model [id number] (serial number  [id number] ) pacemaker.  The pocket was irrigated with copious gentamicin solution.  The pacemaker was then placed into the pocket.  The pocket was then closed in 3 layers with 2.0 Vicryl suture for the 3.0 Vicryl suture subcutaneous and subcuticular layers.  Steri-  Strips and a sterile dressing were then applied. EBL<3ml.  There were no early apparent complications.   CONCLUSIONS:  1. Successful implantation of a St Jude Medical Assurity MRI dual-chamber pacemaker for symptomatic bradycardia  2. No early apparent complications.       Will Curt Bears, MD 11/26/2020 5:41 PM  ECHOCARDIOGRAM COMPLETE  Result Date: 11/26/2020    ECHOCARDIOGRAM REPORT   Patient Name:   Lisa Cortez Date of Exam:  11/26/2020 Medical Rec #:  [id number]    Height:       59.0 in Accession #:    [id number]   Weight:       112.0 lb Date of Birth:  Apr 06, 1925     BSA:          1.441 m Patient Age:    86 years     BP:           228/57 mmHg Patient Gender: F            HR:           44 bpm. Exam Location:  Inpatient Procedure: 2D Echo Indications:    acute diastolic chf 858.85  History:        Patient has prior history of Echocardiogram examinations, most                 recent 07/10/2020. COPD and chronic kidney disease,                 Signs/Symptoms:Dyspnea; Risk Factors:Hypertension and                 Dyslipidemia.  Sonographer:    Johny Chess Referring Phys: [id number] [place]  1. Left ventricular ejection fraction, by estimation, is 60 to 65%. The left ventricle has normal function. The left ventricle has no regional wall motion abnormalities. There is moderate asymmetric left ventricular hypertrophy of the basal-septal segment. Left ventricular diastolic parameters are indeterminate.  2. Right ventricular systolic function is normal. The right ventricular size is normal. Tricuspid regurgitation signal is inadequate for  assessing PA pressure.  3. Left atrial size was mildly dilated.  4. The mitral valve is abnormal. Trivial mitral valve regurgitation. Mild mitral stenosis. MG 26mmHg at 44 bpm. Severe mitral annular calcification.  5. The aortic valve is tricuspid. Aortic valve regurgitation is mild. Mild aortic valve stenosis. Vmax 2.2 m/s, MG 10 mmHg, AVA 1.4 cm^2, DI 0.6  6. The inferior vena cava is dilated in size with >50% respiratory variability, suggesting right atrial pressure of 8 mmHg. FINDINGS  Left Ventricle: Left ventricular ejection fraction, by estimation, is 60 to 65%. The left ventricle has normal function. The left ventricle has no regional wall motion abnormalities. The left ventricular internal cavity size was normal in size. There is  moderate asymmetric left ventricular hypertrophy of the basal-septal segment. Left ventricular diastolic parameters are indeterminate. Right Ventricle: The right ventricular size is normal. No increase in right ventricular wall thickness. Right ventricular systolic function is normal. Tricuspid regurgitation signal is inadequate for assessing PA pressure. Left Atrium: Left atrial size was mildly dilated. Right Atrium: Right atrial size was normal in size. Pericardium: There is no evidence of pericardial effusion. Mitral Valve: The mitral valve is abnormal. Severe mitral annular calcification. Trivial mitral valve regurgitation. Mild mitral valve stenosis. Tricuspid Valve: The tricuspid valve is normal in structure. Tricuspid valve regurgitation is trivial. Aortic Valve: The aortic valve is tricuspid. Aortic valve regurgitation is mild. Aortic regurgitation PHT measures 831 msec. Mild aortic stenosis is present. Aortic valve mean gradient measures 10.0 mmHg. Aortic valve peak gradient measures 20.1 mmHg. Aortic valve area, by VTI measures 1.36 cm. Pulmonic Valve: The pulmonic valve was not well visualized. Pulmonic valve regurgitation is trivial. Aorta: The aortic root and  ascending aorta are structurally normal, with no evidence of dilitation. Venous: The inferior vena cava is dilated in size with greater than 50% respiratory variability, suggesting right atrial pressure of  8 mmHg. IAS/Shunts: The interatrial septum was not well visualized.  LEFT VENTRICLE PLAX 2D LVIDd:         4.90 cm  Diastology LVIDs:         2.40 cm  LV e' medial:    11.70 cm/s LV PW:         1.00 cm  LV E/e' medial:  15.7 LV IVS:        0.80 cm  LV e' lateral:   14.50 cm/s LVOT diam:     1.70 cm  LV E/e' lateral: 12.7 LV SV:         79 LV SV Index:   55 LVOT Area:     2.27 cm  RIGHT VENTRICLE             IVC RV S prime:     15.40 cm/s  IVC diam: 2.10 cm TAPSE (M-mode): 2.0 cm LEFT ATRIUM             Index       RIGHT ATRIUM           Index LA diam:        4.20 cm 2.91 cm/m  RA Area:     12.00 cm LA Vol (A2C):   56.2 ml 38.99 ml/m RA Volume:   25.00 ml  17.34 ml/m LA Vol (A4C):   52.5 ml 36.42 ml/m LA Biplane Vol: 56.7 ml 39.33 ml/m  AORTIC VALVE AV Area (Vmax):    1.19 cm AV Area (Vmean):   1.18 cm AV Area (VTI):     1.36 cm AV Vmax:           224.00 cm/s AV Vmean:          147.000 cm/s AV VTI:            0.578 m AV Peak Grad:      20.1 mmHg AV Mean Grad:      10.0 mmHg LVOT Vmax:         117.00 cm/s LVOT Vmean:        76.700 cm/s LVOT VTI:          0.347 m LVOT/AV VTI ratio: 0.60 AI PHT:            831 msec  AORTA Ao Root diam: 2.50 cm Ao Asc diam:  2.50 cm MITRAL VALVE MV Area (PHT): 5.54 cm     SHUNTS MV Decel Time: 137 msec     Systemic VTI:  0.35 m MV E velocity: 184.00 cm/s  Systemic Diam: 1.70 cm MV A velocity: 147.00 cm/s MV E/A ratio:  1.25 Oswaldo Milian MD Electronically signed by Oswaldo Milian MD Signature Date/Time: 11/26/2020/5:40:48 PM    Final     Microbiology: Recent Results (from the past 240 hour(s))  Surgical PCR screen     Status: None   Collection Time: 11/26/20 10:03 AM   Specimen: Nasal Mucosa; Nasal Swab  Result Value Ref Range Status   MRSA, PCR NEGATIVE  NEGATIVE Final   Staphylococcus aureus NEGATIVE NEGATIVE Final    Comment: (NOTE) The Xpert SA Assay (FDA approved for NASAL specimens in patients 58 years of age and older), is one component of a comprehensive surveillance program. It is not intended to diagnose infection nor to guide or monitor treatment. Performed at [facility], [place] [address], [place], [place] [address]   SARS Coronavirus 2 by RT PCR (hospital order, performed in [facility] hospital lab) Nasopharyngeal Nasopharyngeal Swab  Status: None   Collection Time: 11/26/20 12:28 PM   Specimen: Nasopharyngeal Swab  Result Value Ref Range Status   SARS Coronavirus 2 NEGATIVE NEGATIVE Final    Comment: (NOTE) SARS-CoV-2 target nucleic acids are NOT DETECTED.  The SARS-CoV-2 RNA is generally detectable in upper and lower respiratory specimens during the acute phase of infection. The lowest concentration of SARS-CoV-2 viral copies this assay can detect is 250 copies / mL. A negative result does not preclude SARS-CoV-2 infection and should not be used as the sole basis for treatment or other patient management decisions.  A negative result may occur with improper specimen collection / handling, submission of specimen other than nasopharyngeal swab, presence of viral mutation(s) within the areas targeted by this assay, and inadequate number of viral copies (<250 copies / mL). A negative result must be combined with clinical observations, patient history, and epidemiological information.  Fact Sheet for Patients:   StrictlyIdeas.no  Fact Sheet for Healthcare Providers: BankingDealers.co.za  This test is not yet approved or  cleared by the [place] FDA and has been authorized for detection and/or diagnosis of SARS-CoV-2 by FDA under an Emergency Use Authorization (EUA).  This EUA will remain in effect (meaning this test can be used) for the duration of  the COVID-19 declaration under Section 564(b)(1) of the Act, 21 U.S.C. section 360bbb-3(b)(1), unless the authorization is terminated or revoked sooner.  Performed at [facility], [place] [address] [place], [place] [address]      Labs: Basic Metabolic Panel: Recent Labs  Lab 11/25/20 0926 11/25/20 1246 11/25/20 2056 11/26/20 0116 11/27/20 0356  NA 141  --   --  141 139  K 4.4  --   --  3.9 3.8  CL 108  --   --  107 103  CO2 16*  --   --  20* 22  GLUCOSE 91  --   --  90 80  BUN 34*  --   --  37* 46*  CREATININE 2.05*  --   --  2.15* 2.64*  CALCIUM 9.2  --   --  9.1 8.9  MG  --  2.2 1.9  --   --   PHOS  --   --  3.5  --   --    Liver Function Tests: No results for input(s): AST, ALT, ALKPHOS, BILITOT, PROT, ALBUMIN in the last 168 hours. No results for input(s): LIPASE, AMYLASE in the last 168 hours. No results for input(s): AMMONIA in the last 168 hours. CBC: Recent Labs  Lab 11/25/20 0926 11/27/20 0356  WBC 5.7 6.5  HGB 12.1 12.6  HCT 39.1 40.2  MCV 101.6* 97.8  PLT 194 161   Cardiac Enzymes: No results for input(s): CKTOTAL, CKMB, CKMBINDEX, TROPONINI in the last 168 hours. BNP: BNP (last 3 results) Recent Labs    07/14/20 0425 07/15/20 0452 11/25/20 1246  BNP 375.9* 322.4* 3,006.4*    ProBNP (last 3 results) No results for input(s): PROBNP in the last 8760 hours.  CBG: No results for input(s): GLUCAP in the last 168 hours.     Signed:  Cristal Ford  [facility] 11/27/2020, 1:17 PM

## 2020-12-09 ENCOUNTER — Other Ambulatory Visit: Payer: Self-pay

## 2020-12-09 ENCOUNTER — Ambulatory Visit (INDEPENDENT_AMBULATORY_CARE_PROVIDER_SITE_OTHER): Payer: Medicare Other | Admitting: Emergency Medicine

## 2020-12-09 DIAGNOSIS — I442 Atrioventricular block, complete: Secondary | ICD-10-CM | POA: Diagnosis not present

## 2020-12-09 DIAGNOSIS — Z950 Presence of cardiac pacemaker: Secondary | ICD-10-CM

## 2020-12-09 LAB — CUP PACEART INCLINIC DEVICE CHECK
Battery Remaining Longevity: 136 mo
Battery Voltage: 3.08 V
Brady Statistic RA Percent Paced: 0.11 %
Brady Statistic RV Percent Paced: 58 %
Date Time Interrogation Session: 20211221114401
Implantable Lead Implant Date: 20211208
Implantable Lead Implant Date: 20211208
Implantable Lead Location: 753859
Implantable Lead Location: 753860
Implantable Pulse Generator Implant Date: 20211208
Lead Channel Impedance Value: 462.5 Ohm
Lead Channel Impedance Value: 562.5 Ohm
Lead Channel Pacing Threshold Amplitude: 0.5 V
Lead Channel Pacing Threshold Amplitude: 0.625 V
Lead Channel Pacing Threshold Pulse Width: 0.5 ms
Lead Channel Pacing Threshold Pulse Width: 0.5 ms
Lead Channel Sensing Intrinsic Amplitude: 12 mV
Lead Channel Sensing Intrinsic Amplitude: 3.3 mV
Lead Channel Setting Pacing Amplitude: 0.875
Lead Channel Setting Pacing Amplitude: 3.5 V
Lead Channel Setting Pacing Pulse Width: 0.5 ms
Lead Channel Setting Sensing Sensitivity: 2 mV
Pulse Gen Model: 2272
Pulse Gen Serial Number: 3882125

## 2020-12-09 NOTE — Progress Notes (Signed)
Wound check appointment. Steri-strips removed. Wound without redness or edema. Incision edges approximated, wound well healed. Normal device function. Thresholds, sensing, and impedances consistent with implant measurements. Device programmed at 3.5V/auto capture programmed on for extra safety margin until 3 month visit. Histogram distribution appropriate for patient and level of activity. No mode switches or high ventricular rates noted. Patient educated about wound care, arm mobility, lifting restrictions. ROV in 3 months with implanting physician. Enrolled in remote follow-up and next remote 03/10/20.

## 2020-12-22 DIAGNOSIS — N184 Chronic kidney disease, stage 4 (severe): Secondary | ICD-10-CM | POA: Diagnosis not present

## 2020-12-22 DIAGNOSIS — I5030 Unspecified diastolic (congestive) heart failure: Secondary | ICD-10-CM | POA: Diagnosis not present

## 2020-12-22 DIAGNOSIS — I129 Hypertensive chronic kidney disease with stage 1 through stage 4 chronic kidney disease, or unspecified chronic kidney disease: Secondary | ICD-10-CM | POA: Diagnosis not present

## 2021-01-12 ENCOUNTER — Other Ambulatory Visit: Payer: Self-pay

## 2021-01-12 ENCOUNTER — Emergency Department (HOSPITAL_COMMUNITY)
Admission: EM | Admit: 2021-01-12 | Discharge: 2021-01-13 | Disposition: A | Discharge status: Home / Self Care | Payer: Medicare Other | Attending: Emergency Medicine | Admitting: Emergency Medicine

## 2021-01-12 ENCOUNTER — Encounter (HOSPITAL_COMMUNITY): Payer: Self-pay | Admitting: Emergency Medicine

## 2021-01-12 DIAGNOSIS — I130 Hypertensive heart and chronic kidney disease with heart failure and stage 1 through stage 4 chronic kidney disease, or unspecified chronic kidney disease: Secondary | ICD-10-CM | POA: Insufficient documentation

## 2021-01-12 DIAGNOSIS — R0602 Shortness of breath: Secondary | ICD-10-CM | POA: Diagnosis not present

## 2021-01-12 DIAGNOSIS — R079 Chest pain, unspecified: Secondary | ICD-10-CM | POA: Diagnosis not present

## 2021-01-12 DIAGNOSIS — J441 Chronic obstructive pulmonary disease with (acute) exacerbation: Secondary | ICD-10-CM | POA: Insufficient documentation

## 2021-01-12 DIAGNOSIS — Z87891 Personal history of nicotine dependence: Secondary | ICD-10-CM | POA: Diagnosis not present

## 2021-01-12 DIAGNOSIS — I509 Heart failure, unspecified: Secondary | ICD-10-CM | POA: Insufficient documentation

## 2021-01-12 DIAGNOSIS — Z7982 Long term (current) use of aspirin: Secondary | ICD-10-CM | POA: Diagnosis not present

## 2021-01-12 DIAGNOSIS — R0789 Other chest pain: Secondary | ICD-10-CM | POA: Diagnosis not present

## 2021-01-12 DIAGNOSIS — I10 Essential (primary) hypertension: Secondary | ICD-10-CM | POA: Diagnosis not present

## 2021-01-12 DIAGNOSIS — R0902 Hypoxemia: Secondary | ICD-10-CM | POA: Diagnosis not present

## 2021-01-12 DIAGNOSIS — Z79899 Other long term (current) drug therapy: Secondary | ICD-10-CM | POA: Insufficient documentation

## 2021-01-12 DIAGNOSIS — E039 Hypothyroidism, unspecified: Secondary | ICD-10-CM | POA: Insufficient documentation

## 2021-01-12 DIAGNOSIS — N184 Chronic kidney disease, stage 4 (severe): Secondary | ICD-10-CM | POA: Insufficient documentation

## 2021-01-12 DIAGNOSIS — J811 Chronic pulmonary edema: Secondary | ICD-10-CM | POA: Diagnosis not present

## 2021-01-12 DIAGNOSIS — I517 Cardiomegaly: Secondary | ICD-10-CM | POA: Diagnosis not present

## 2021-01-12 DIAGNOSIS — I5043 Acute on chronic combined systolic (congestive) and diastolic (congestive) heart failure: Secondary | ICD-10-CM | POA: Diagnosis not present

## 2021-01-12 DIAGNOSIS — I110 Hypertensive heart disease with heart failure: Secondary | ICD-10-CM | POA: Diagnosis not present

## 2021-01-12 NOTE — ED Triage Notes (Signed)
Patient from [facility] home, patient with shortness of breath and chest pain for "awhile".  Pacemaker placed about one week ago.  She had chest pain and shortness of breath before the pacemaker placement.  She is also having discomfort at the area of placement of the pacemaker.  Initial stats at 90-91%, but was not taking a deep breath.

## 2021-01-13 ENCOUNTER — Emergency Department (HOSPITAL_COMMUNITY): Payer: Medicare Other

## 2021-01-13 DIAGNOSIS — R0902 Hypoxemia: Secondary | ICD-10-CM | POA: Diagnosis not present

## 2021-01-13 DIAGNOSIS — J811 Chronic pulmonary edema: Secondary | ICD-10-CM | POA: Diagnosis not present

## 2021-01-13 DIAGNOSIS — R0789 Other chest pain: Secondary | ICD-10-CM | POA: Diagnosis not present

## 2021-01-13 DIAGNOSIS — M2550 Pain in unspecified joint: Secondary | ICD-10-CM | POA: Diagnosis not present

## 2021-01-13 DIAGNOSIS — I517 Cardiomegaly: Secondary | ICD-10-CM | POA: Diagnosis not present

## 2021-01-13 DIAGNOSIS — R079 Chest pain, unspecified: Secondary | ICD-10-CM | POA: Diagnosis not present

## 2021-01-13 DIAGNOSIS — Z7401 Bed confinement status: Secondary | ICD-10-CM | POA: Diagnosis not present

## 2021-01-13 LAB — CBC
HCT: 36.6 % (ref 36.0–46.0)
Hemoglobin: 10.8 g/dL — ABNORMAL LOW (ref 12.0–15.0)
MCH: 31.2 pg (ref 26.0–34.0)
MCHC: 29.5 g/dL — ABNORMAL LOW (ref 30.0–36.0)
MCV: 105.8 fL — ABNORMAL HIGH (ref 80.0–100.0)
Platelets: 159 10*3/uL (ref 150–400)
RBC: 3.46 MIL/uL — ABNORMAL LOW (ref 3.87–5.11)
RDW: 14.6 % (ref 11.5–15.5)
WBC: 5.5 10*3/uL (ref 4.0–10.5)
nRBC: 0 % (ref 0.0–0.2)

## 2021-01-13 LAB — BASIC METABOLIC PANEL
Anion gap: 11 (ref 5–15)
BUN: 34 mg/dL — ABNORMAL HIGH (ref 8–23)
CO2: 21 mmol/L — ABNORMAL LOW (ref 22–32)
Calcium: 8.2 mg/dL — ABNORMAL LOW (ref 8.9–10.3)
Chloride: 103 mmol/L (ref 98–111)
Creatinine, Ser: 1.9 mg/dL — ABNORMAL HIGH (ref 0.44–1.00)
GFR, Estimated: 24 mL/min — ABNORMAL LOW (ref >60)
Glucose, Bld: 94 mg/dL (ref 70–99)
Potassium: 4.1 mmol/L (ref 3.5–5.1)
Sodium: 135 mmol/L (ref 135–145)

## 2021-01-13 LAB — TROPONIN I (HIGH SENSITIVITY)
Troponin I (High Sensitivity): 17 ng/L (ref <18)
Troponin I (High Sensitivity): 16 ng/L (ref <18)

## 2021-01-13 LAB — BRAIN NATRIURETIC PEPTIDE: B Natriuretic Peptide: 750 pg/mL — ABNORMAL HIGH (ref 0.0–100.0)

## 2021-01-13 MED ORDER — CARVEDILOL 12.5 MG PO TABS
6.2500 mg | ORAL_TABLET | Freq: Once | ORAL | Status: AC
  Administered 2021-01-13: 6.25 mg via ORAL
  Filled 2021-01-13: qty 1

## 2021-01-13 MED ORDER — FUROSEMIDE 10 MG/ML IJ SOLN
40.0000 mg | Freq: Once | INTRAMUSCULAR | Status: AC
  Administered 2021-01-13: 40 mg via INTRAVENOUS
  Filled 2021-01-13: qty 4

## 2021-01-13 MED ORDER — ISOSORBIDE MONONITRATE ER 30 MG PO TB24
30.0000 mg | ORAL_TABLET | Freq: Once | ORAL | Status: AC
  Administered 2021-01-13: 30 mg via ORAL
  Filled 2021-01-13: qty 1

## 2021-01-13 NOTE — ED Provider Notes (Signed)
[facility] EMERGENCY DEPARTMENT Provider Note   CSN: [id number] Arrival date & time: 01/12/21  2348     History Chief Complaint  Patient presents with  . Chest Pain  . Shortness of Breath    Lisa Cortez is a 87 y.o. female.  Patient from [facility]CF, has hx copd, cad, chf, c/o cp and sob in the past week. Symptoms acute onset, mild, persistent, at rest. Denies associated nv, or diaphoresis. Pain was not pleuritic. Denies increased lower extremity edema. ?orthopnea. Mild doe. Pt unsure if she's had any of her lasix in past week, states someone puts out her meds for her. Denies exertional cp or discomfort. No cough or sore throat. No fever or chills. Currently states no chest pain or discomfort, +mild sob w activity.   The history is provided by the patient and the EMS personnel.  Chest Pain Associated symptoms: shortness of breath   Associated symptoms: no abdominal pain, no back pain, no cough, no fever, no headache, no nausea, no palpitations and no vomiting   Shortness of Breath Associated symptoms: chest pain   Associated symptoms: no abdominal pain, no cough, no fever, no headaches, no neck pain, no rash, no sore throat and no vomiting        Past Medical History:  Diagnosis Date  . Anemia of renal disease   . Aortic insufficiency   . Aortic stenosis   . Chronic kidney disease (CKD), stage IV (severe) ([place])   . COPD (chronic obstructive pulmonary disease) ([place])   . Diastolic dysfunction   . Hyperlipidemia   . Hypertension   . Hypothyroidism   . 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 AV block, 3rd degree ([place]) 11/25/2020  . CHF (congestive heart failure) ([place]) 11/25/2020  . NSTEMI (non-ST elevated myocardial infarction) ([place]) 07/09/2020  . Chronic kidney disease (CKD), stage IV (severe) ([place])   . Hyperlipidemia   . Hypothyroidism   . Normocytic anemia   . Transaminitis   . Diastolic  dysfunction   . Acute respiratory disease due to COVID-19 virus   . Sepsis ([place]) 08/20/2019  . Pneumonia 08/20/2019  . COPD with acute exacerbation ([place]) 08/20/2019  . Acute on chronic respiratory failure with hypoxia ([place]) 08/20/2019  . Chest pain 08/20/2019  . Nausea and vomiting 08/20/2019  . Macrocytic anemia 08/20/2019  . Hypertension 08/20/2019  . 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 BREAST REDUCTION SURGERY    . CHOLECYSTECTOMY    . PACEMAKER IMPLANT N/A 11/26/2020   Procedure: PACEMAKER IMPLANT;  Surgeon: Constance Haw, MD;  Location: [facility];  Service: Cardiovascular;  Laterality: N/A;     OB History   No obstetric history on file.     Family History  Problem Relation Age of Onset  . CVA Mother   . Bone cancer Brother   . Dementia Brother     Social History   Tobacco Use  . Smoking status: Former Research scientist (life sciences)  . Smokeless tobacco: Never Used  Vaping Use  . Vaping Use: Never used  Substance Use Topics  . Alcohol use: Yes    Comment: 1/2 glass of wine night  . Drug use: Never    Home Medications Prior to Admission medications   Medication Sig Start Date End Date Taking? Authorizing Provider  albuterol (VENTOLIN HFA) 108 (90 Base)  MCG/ACT inhaler Inhale 2 puffs into the lungs every 4 (four) hours as needed. 05/25/19   [provider]  aspirin EC 81 MG tablet Take 1 tablet (81 mg total) by mouth daily. 07/19/20   Thurnell Lose, MD  atorvastatin (LIPITOR) 10 MG tablet Take 10 mg by mouth daily.  06/22/19   [provider]  carvedilol (COREG) 6.25 MG tablet Take 1 tablet (6.25 mg total) by mouth 2 (two) times daily with a meal. 11/27/20   Mikhail, Velta Addison, DO  Cyanocobalamin (B-12) 2000 MCG TABS Take 1,000 mcg by mouth daily.    [provider]  Fluticasone-Salmeterol (ADVAIR) 250-50 MCG/DOSE AEPB Inhale 1 puff into the lungs 2 (two) times daily.  07/31/18   [provider]  furosemide (LASIX) 20 MG tablet Take 1 tablet (20 mg total) by mouth daily as needed for fluid or edema (weight gain >3lbs). 11/27/20 11/27/21  Cristal Ford, DO  isosorbide mononitrate (IMDUR) 30 MG 24 hr tablet Take 1 tablet (30 mg total) by mouth daily. 07/15/20 11/25/20  Thurnell Lose, MD  levothyroxine (SYNTHROID) 75 MCG tablet Take 75 mcg by mouth daily. 08/14/19   [provider]  melatonin 3 MG TABS tablet Take 3 mg by mouth at bedtime as needed for sleep. 02/20/16   [provider]  memantine (NAMENDA) 5 MG tablet Take 5 mg by mouth 2 (two) times daily.  10/23/18   [provider]  montelukast (SINGULAIR) 10 MG tablet Take 10 mg by mouth daily. 06/18/19   [provider]  nitroGLYCERIN (NITROSTAT) 0.4 MG SL tablet Place 0.4 mg under the tongue every 5 (five) minutes as needed for chest pain.  11/04/20   [provider]  pantoprazole (PROTONIX) 40 MG tablet Take 1 tablet (40 mg total) by mouth 2 (two) times daily. 07/15/20   Thurnell Lose, MD  pramipexole (MIRAPEX) 0.25 MG tablet Take 0.25 mg by mouth daily.  06/19/19   [provider]  sodium bicarbonate 650 MG tablet Take 1,300 mg by mouth 2 (two) times daily.  06/19/19   [provider]  traMADol (ULTRAM) 50 MG tablet Take 50 mg by mouth daily as needed for pain. 11/03/20   [provider]    Allergies    Tenex [guanfacine hcl], Calcitonin, Epoetin alfa, Fosamax [alendronate sodium], Gatifloxacin, Procrit [epoetin (alfa)], Verapamil, and Atorvastatin  Review of Systems   Review of Systems  Constitutional: Negative for chills and fever.  HENT: Negative for sore throat.   Eyes: Negative for redness.  Respiratory: Positive for shortness of breath. Negative for cough.   Cardiovascular: Positive for chest pain. Negative for palpitations and leg swelling.  Gastrointestinal: Negative for abdominal pain, nausea and vomiting.   Genitourinary: Negative for flank pain.  Musculoskeletal: Negative for back pain and neck pain.  Skin: Negative for rash.  Neurological: Negative for headaches.  Hematological: Does not bruise/bleed easily.  Psychiatric/Behavioral: Negative for confusion.    Physical Exam Updated Vital Signs BP (!) 211/81   Pulse 70   Temp 98 F (36.7 C) (Oral)   Resp 15   SpO2 95%   Physical Exam Vitals and nursing note reviewed.  Constitutional:      Appearance: Normal appearance. She is well-developed.  HENT:     Head: Atraumatic.     Nose: Nose normal.     Mouth/Throat:     Mouth: Mucous membranes are moist.  Eyes:     General: No scleral icterus.    Conjunctiva/sclera: Conjunctivae normal.  Neck:     Trachea: No tracheal deviation.  Cardiovascular:     Rate and Rhythm: Normal rate and regular rhythm.     Pulses: Normal pulses.     Heart sounds: Normal heart sounds. No murmur heard. No friction rub. No gallop.   Pulmonary:     Effort: Pulmonary effort is normal. No respiratory distress.     Breath sounds: Normal breath sounds.  Abdominal:     General: Bowel sounds are normal. There is no distension.     Palpations: Abdomen is soft.     Tenderness: There is no abdominal tenderness. There is no guarding.  Genitourinary:    Comments: No cva tenderness.  Musculoskeletal:        General: No swelling or tenderness.     Cervical back: Normal range of motion and neck supple. No rigidity. No muscular tenderness.  Skin:    General: Skin is warm and dry.     Findings: No rash.  Neurological:     Mental Status: She is alert.     Comments: Alert, speech normal.   Psychiatric:        Mood and Affect: Mood normal.     ED Results / Procedures / Treatments   Labs (all labs ordered are listed, but only abnormal results are displayed) Results for orders placed or performed during the hospital encounter of [id number]  Basic metabolic panel  Result Value Ref Range   Sodium 135 135 -  145 mmol/L   Potassium 4.1 3.5 - 5.1 mmol/L   Chloride 103 98 - 111 mmol/L   CO2 21 (L) 22 - 32 mmol/L   Glucose, Bld 94 70 - 99 mg/dL   BUN 34 (H) 8 - 23 mg/dL   Creatinine, Ser 1.90 (H) 0.44 - 1.00 mg/dL   Calcium 8.2 (L) 8.9 - 10.3 mg/dL   GFR, Estimated 24 (L) >60 mL/min   Anion gap 11 5 - 15  CBC  Result Value Ref Range   WBC 5.5 4.0 - 10.5 K/uL   RBC 3.46 (L) 3.87 - 5.11 MIL/uL   Hemoglobin 10.8 (L) 12.0 - 15.0 g/dL   HCT 36.6 36.0 - 46.0 %   MCV 105.8 (H) 80.0 - 100.0 fL   MCH 31.2 26.0 - 34.0 pg   MCHC 29.5 (L) 30.0 - 36.0 g/dL   RDW 14.6 11.5 - 15.5 %   Platelets 159 150 - 400 K/uL   nRBC 0.0 0.0 - 0.2 %  Brain natriuretic peptide  Result Value Ref Range   B Natriuretic Peptide 750.0 (H) 0.0 - 100.0 pg/mL  Troponin I (High Sensitivity)  Result Value Ref Range   Troponin I (High Sensitivity) 17 <18 ng/L  Troponin I (High Sensitivity)  Result Value Ref Range   Troponin I (High Sensitivity) 16 <18 ng/L   DG Chest 2 View  Result Date: 01/13/2021 CLINICAL DATA:  Chest pain EXAM: CHEST - 2 VIEW COMPARISON:  11/27/2020 FINDINGS: Mild cardiomegaly with prominent interstitial markings, possibly mild pulmonary edema. No pleural effusion or pneumothorax. No focal airspace consolidation. Unchanged thoracolumbar junction compression fracture. Unchanged position of pacemaker. IMPRESSION: Cardiomegaly with possible mild pulmonary edema. Electronically Signed   By: Ulyses Jarred M.D.   On: 01/13/2021 00:18    EKG EKG Interpretation  Date/Time:  Tuesday January 13 2021 00:01:04 EST Ventricular Rate:  73 PR Interval:  240 QRS Duration: 74 QT Interval:  428 QTC Calculation: 471 R Axis:   62 Text Interpretation: Sinus rhythm with 1st degree  A-V block Possible Left atrial enlargement ST & T wave abnormality, consider inferolateral ischemia Prolonged QT Abnormal ECG When compared with ECG of 11/27/2020, Sinus rhythm has replaced Atrial-sensed ventricular-paced rhythm QT has shortened  Confirmed by Delora Fuel ([id number]) on 01/13/2021 12:21:12 AM   Radiology DG Chest 2 View  Result Date: 01/13/2021 CLINICAL DATA:  Chest pain EXAM: CHEST - 2 VIEW COMPARISON:  11/27/2020 FINDINGS: Mild cardiomegaly with prominent interstitial markings, possibly mild pulmonary edema. No pleural effusion or pneumothorax. No focal airspace consolidation. Unchanged thoracolumbar junction compression fracture. Unchanged position of pacemaker. IMPRESSION: Cardiomegaly with possible mild pulmonary edema. Electronically Signed   By: Ulyses Jarred M.D.   On: 01/13/2021 00:18    Procedures Procedures   Medications Ordered in ED Medications  furosemide (LASIX) injection 40 mg (has no administration in time range)    ED Course  I have reviewed the triage vital signs and the nursing notes.  Pertinent labs & imaging results that were available during my care of the patient were reviewed by me and considered in my medical decision making (see chart for details).    MDM Rules/Calculators/A&P                          Iv ns. Continuous pulse ox and cardiac monitoring. Stat labs and imaging.   MDM Number of Diagnoses or Management Options   Amount and/or Complexity of Data Reviewed Clinical lab tests: ordered and reviewed Tests in the radiology section of CPT: ordered and reviewed Tests in the medicine section of CPT: ordered and reviewed Discussion of test results with the performing providers: yes Decide to obtain previous medical records or to obtain history from someone other than the patient: yes Obtain history from someone other than the patient: yes Review and summarize past medical records: yes Discuss the patient with other providers: yes Independent visualization of images, tracings, or specimens: yes  Risk of Complications, Morbidity, and/or Mortality Presenting problems: high Diagnostic procedures: high Management options: high   Reviewed nursing notes and prior charts for  additional history. Recent hospitalization reviewed.  Labs reviewed/interpreted by me - initial and delta trop normal and not increasing. BNP is elevated. Wbc normal. Renal fxn sl improved from prior.   CXR reviewed/interpreted by me - vascular congestion/?chf, mild.  Pt unsure if she's had any recent lasix, cxr c/w mild vascular congestion/chf. Lasix iv.    Pt also indicates has not had her meds/bp meds this AM  - provided.  Recheck pt, pt comfortable, no chest pain or discomfort. Is lying flat, with no increased wob, and room air pulse ox 96%.  Her bp is improved, 164/92.   Patient currently appears stable for d/c. rec close outpt f/u with her doctor/cardiologist.   Return precautions provided.      Final Clinical Impression(s) / ED Diagnoses Final diagnoses:  None    Rx / DC Orders ED Discharge Orders    None       Lajean Saver, MD 01/13/21 1014

## 2021-01-13 NOTE — ED Notes (Signed)
Patient assisted to bathroom 

## 2021-01-13 NOTE — ED Notes (Signed)
PTAR called @ 1114-per Sarah, RN called by Levada Dy

## 2021-01-13 NOTE — Discharge Instructions (Signed)
It was our pleasure to provide your ER care today - we hope that you feel better.  It appears you have a bit extra fluids/mild congestive failure, and your blood pressure was high.  Continue your medications, limit salt intake, take one of your lasix tablets a day for the next two days, and follow up closely with your doctor/cardiologist in the coming week - call office today to arrange follow up appointment.   Return to ER if worse, new symptoms, fevers, new or severe pain, recurrent or persistent chest pain, increased trouble breathing, or other concern.

## 2021-02-26 ENCOUNTER — Ambulatory Visit (INDEPENDENT_AMBULATORY_CARE_PROVIDER_SITE_OTHER): Payer: Medicare Other

## 2021-02-26 DIAGNOSIS — I442 Atrioventricular block, complete: Secondary | ICD-10-CM | POA: Diagnosis not present

## 2021-03-01 LAB — CUP PACEART REMOTE DEVICE CHECK
Battery Remaining Longevity: 107 mo
Battery Remaining Percentage: 95.5 %
Battery Voltage: 3.02 V
Brady Statistic AP VP Percent: 1 %
Brady Statistic AP VS Percent: 1 %
Brady Statistic AS VP Percent: 65 %
Brady Statistic AS VS Percent: 35 %
Brady Statistic RA Percent Paced: 1 %
Brady Statistic RV Percent Paced: 65 %
Date Time Interrogation Session: 20220310020014
Implantable Lead Implant Date: 20211208
Implantable Lead Implant Date: 20211208
Implantable Lead Location: 753859
Implantable Lead Location: 753860
Implantable Pulse Generator Implant Date: 20211208
Lead Channel Impedance Value: 410 Ohm
Lead Channel Impedance Value: 600 Ohm
Lead Channel Pacing Threshold Amplitude: 0.5 V
Lead Channel Pacing Threshold Amplitude: 0.75 V
Lead Channel Pacing Threshold Pulse Width: 0.5 ms
Lead Channel Pacing Threshold Pulse Width: 0.5 ms
Lead Channel Sensing Intrinsic Amplitude: 12 mV
Lead Channel Sensing Intrinsic Amplitude: 4.4 mV
Lead Channel Setting Pacing Amplitude: 1 V
Lead Channel Setting Pacing Amplitude: 3.5 V
Lead Channel Setting Pacing Pulse Width: 0.5 ms
Lead Channel Setting Sensing Sensitivity: 2 mV
Pulse Gen Model: 2272
Pulse Gen Serial Number: 3882125

## 2021-03-06 DIAGNOSIS — Z79899 Other long term (current) drug therapy: Secondary | ICD-10-CM | POA: Diagnosis not present

## 2021-03-06 DIAGNOSIS — E7800 Pure hypercholesterolemia, unspecified: Secondary | ICD-10-CM | POA: Diagnosis not present

## 2021-03-06 DIAGNOSIS — Z0000 Encounter for general adult medical examination without abnormal findings: Secondary | ICD-10-CM | POA: Diagnosis not present

## 2021-03-06 DIAGNOSIS — N184 Chronic kidney disease, stage 4 (severe): Secondary | ICD-10-CM | POA: Diagnosis not present

## 2021-03-06 DIAGNOSIS — G301 Alzheimer's disease with late onset: Secondary | ICD-10-CM | POA: Diagnosis not present

## 2021-03-06 DIAGNOSIS — I129 Hypertensive chronic kidney disease with stage 1 through stage 4 chronic kidney disease, or unspecified chronic kidney disease: Secondary | ICD-10-CM | POA: Diagnosis not present

## 2021-03-06 DIAGNOSIS — Z1389 Encounter for screening for other disorder: Secondary | ICD-10-CM | POA: Diagnosis not present

## 2021-03-06 DIAGNOSIS — I5030 Unspecified diastolic (congestive) heart failure: Secondary | ICD-10-CM | POA: Diagnosis not present

## 2021-03-06 DIAGNOSIS — J449 Chronic obstructive pulmonary disease, unspecified: Secondary | ICD-10-CM | POA: Diagnosis not present

## 2021-03-06 DIAGNOSIS — E1122 Type 2 diabetes mellitus with diabetic chronic kidney disease: Secondary | ICD-10-CM | POA: Diagnosis not present

## 2021-03-06 DIAGNOSIS — E039 Hypothyroidism, unspecified: Secondary | ICD-10-CM | POA: Diagnosis not present

## 2021-03-06 NOTE — Progress Notes (Signed)
Remote pacemaker transmission.   

## 2021-03-16 ENCOUNTER — Other Ambulatory Visit: Payer: Self-pay

## 2021-03-16 ENCOUNTER — Encounter: Payer: Self-pay | Admitting: Cardiology

## 2021-03-16 ENCOUNTER — Ambulatory Visit (INDEPENDENT_AMBULATORY_CARE_PROVIDER_SITE_OTHER): Payer: Medicare Other | Admitting: Cardiology

## 2021-03-16 VITALS — BP 134/72 | HR 75 | Ht 59.0 in | Wt 118.0 lb

## 2021-03-16 DIAGNOSIS — I441 Atrioventricular block, second degree: Secondary | ICD-10-CM

## 2021-03-16 NOTE — Patient Instructions (Signed)
Medication Instructions:  Your physician recommends that you continue on your current medications as directed. Please refer to the Current Medication list given to you today.  *If you need a refill on your cardiac medications before your next appointment, please call your pharmacy*   Lab Work: None ordered   Testing/Procedures: None ordered   Follow-Up: At [facility], you and your health needs are our priority.  As part of our continuing mission to provide you with exceptional heart care, we have created designated Provider Care Teams.  These Care Teams include your primary Cardiologist (physician) and Advanced Practice Providers (APPs -  Physician Assistants and Nurse Practitioners) who all work together to provide you with the care you need, when you need it.  We recommend signing up for the patient portal called "MyChart".  Sign up information is provided on this After Visit Summary.  MyChart is used to connect with patients for Virtual Visits (Telemedicine).  Patients are able to view lab/test results, encounter notes, upcoming appointments, etc.  Non-urgent messages can be sent to your provider as well.   To learn more about what you can do with MyChart, go to [email or web address].    Remote monitoring is used to monitor your Pacemaker or ICD from home. This monitoring reduces the number of office visits required to check your device to one time per year. It allows [place] to keep an eye on the functioning of your device to ensure it is working properly. You are scheduled for a device check from home on 05/28/2021. You may send your transmission at any time that day. If you have a wireless device, the transmission will be sent automatically. After your physician reviews your transmission, you will receive a postcard with your next transmission date.  Your next appointment:   9 month(s)  The format for your next appointment:   In Person  Provider:   Allegra Lai, MD   Thank you  for choosing [facility]!!   Trinidad Curet, RN [phone]

## 2021-03-16 NOTE — Progress Notes (Signed)
Electrophysiology Office Note   Date:  03/16/2021   ID:  Lisa Cortez, DOB [birth year], MRN [id number]  PCP:  Lajean Manes, MD  Cardiologist:   Primary Electrophysiologist:  Will Meredith Leeds, MD    Chief Complaint: pacemaker   History of Present Illness: Lisa Cortez is a 87 y.o. female who is being seen today for the evaluation of pacemaker at the request of Lajean Manes, MD. Presenting today for electrophysiology evaluation.  She has a history significant for anemia, COPD, CKD stage IV, hyperlipidemia, diastolic heart failure, hypertension, memory issues, and hypothyroidism.  She presented to emergency room with dyspnea and orthopnea.  She is found to be in 2-1 AV block.  Her carvedilol was held.  She continued to have heart block and is now status post Newborn dual-chamber pacemaker implanted 11/26/2020.  Today, she denies symptoms of palpitations, chest pain, shortness of breath, orthopnea, PND, lower extremity edema, claudication, dizziness, presyncope, syncope, bleeding, or neurologic sequela. The patient is tolerating medications without difficulties.    Past Medical History:  Diagnosis Date  . Anemia of renal disease   . Aortic insufficiency   . Aortic stenosis   . Chronic kidney disease (CKD), stage IV (severe) ([place])   . COPD (chronic obstructive pulmonary disease) ([place])   . Diastolic dysfunction   . Hyperlipidemia   . Hypertension   . Hypothyroidism   . 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 BREAST REDUCTION SURGERY    . CHOLECYSTECTOMY    . PACEMAKER IMPLANT N/A 11/26/2020   Procedure: PACEMAKER IMPLANT;  Surgeon: Constance Haw, MD;  Location: [facility];  Service: Cardiovascular;  Laterality: N/A;     Current Outpatient Medications  Medication Sig Dispense Refill  . albuterol (VENTOLIN HFA) 108 (90 Base) MCG/ACT inhaler Inhale 2 puffs into the lungs every 4 (four) hours as  needed.    Marland Kitchen aspirin EC 81 MG tablet Take 1 tablet (81 mg total) by mouth daily. 30 tablet 11  . atorvastatin (LIPITOR) 10 MG tablet Take 10 mg by mouth daily.     . carvedilol (COREG) 6.25 MG tablet Take 1 tablet (6.25 mg total) by mouth 2 (two) times daily with a meal. 30 tablet 1  . Cyanocobalamin (B-12) 2000 MCG TABS Take 1,000 mcg by mouth daily.    . Fluticasone-Salmeterol (ADVAIR) 250-50 MCG/DOSE AEPB Inhale 1 puff into the lungs 2 (two) times daily.    . furosemide (LASIX) 20 MG tablet Take 1 tablet (20 mg total) by mouth daily as needed for fluid or edema (weight gain >3lbs). 30 tablet 1  . isosorbide mononitrate (IMDUR) 30 MG 24 hr tablet Take 1 tablet (30 mg total) by mouth daily. 30 tablet 0  . levothyroxine (SYNTHROID) 75 MCG tablet Take 75 mcg by mouth daily.    . melatonin 3 MG TABS tablet Take 3 mg by mouth at bedtime as needed for sleep.    . memantine (NAMENDA) 5 MG tablet Take 5 mg by mouth 2 (two) times daily.     . montelukast (SINGULAIR) 10 MG tablet Take 10 mg by mouth daily.    . nitroGLYCERIN (NITROSTAT) 0.4 MG SL tablet Place 0.4 mg under the tongue every 5 (five) minutes as needed for chest pain.     . pantoprazole (PROTONIX) 40 MG tablet Take 1 tablet (40 mg total) by mouth 2 (two) times daily. 60 tablet 0  . pramipexole (MIRAPEX) 0.25 MG  tablet Take 0.25 mg by mouth daily.     . sodium bicarbonate 650 MG tablet Take 1,300 mg by mouth 2 (two) times daily.     . traMADol (ULTRAM) 50 MG tablet Take 50 mg by mouth daily as needed for pain.     No current facility-administered medications for this visit.    Allergies:   Tenex [guanfacine hcl], Calcitonin, Epoetin alfa, Fosamax [alendronate sodium], Gatifloxacin, Procrit [epoetin (alfa)], Verapamil, and Atorvastatin   Social History:  The patient  reports that she has quit smoking. She has never used smokeless tobacco. She reports current alcohol use. She reports that she does not use drugs.   Family History:  The  patient's family history includes Bone cancer in her brother; CVA in her mother; Dementia in her brother.    ROS:  Please see the history of present illness.   Otherwise, review of systems is positive for none.   All other systems are reviewed and negative.    PHYSICAL EXAM: VS:  BP 134/72   Pulse 75   Ht '4\' 11"'$  (1.499 m)   Wt 118 lb (53.5 kg)   BMI 23.83 kg/m  , BMI Body mass index is 23.83 kg/m. GEN: Well nourished, well developed, in no acute distress  HEENT: normal  Neck: no JVD, carotid bruits, or masses Cardiac: RRR; no murmurs, rubs, or gallops,no edema  Respiratory:  clear to auscultation bilaterally, normal work of breathing GI: soft, nontender, nondistended, + BS MS: no deformity or atrophy  Skin: warm and dry, device pocket is well healed Neuro:  Strength and sensation are intact Psych: euthymic mood, full affect  EKG:  EKG is ordered today. Personal review of the ekg ordered shows sinus rhythm, diffuse T wave inversions, rate 75, first-degree AV block  Device interrogation is reviewed today in detail.  See PaceArt for details.   Recent Labs: 07/15/2020: ALT 23 11/25/2020: Magnesium 1.9; TSH 3.855 01/13/2021: B Natriuretic Peptide 750.0; BUN 34; Creatinine, Ser 1.90; Hemoglobin 10.8; Platelets 159; Potassium 4.1; Sodium 135    Lipid Panel     Component Value Date/Time   TRIG 124 07/09/2020 1550     Wt Readings from Last 3 Encounters:  03/16/21 118 lb (53.5 kg)  11/27/20 113 lb 15.7 oz (51.7 kg)  10/31/20 136 lb 11 oz (62 kg)      Other studies Reviewed: Additional studies/ records that were reviewed today include: TTE 11/26/20  Review of the above records today demonstrates:  1. Left ventricular ejection fraction, by estimation, is 60 to 65%. The  left ventricle has normal function. The left ventricle has no regional  wall motion abnormalities. There is moderate asymmetric left ventricular  hypertrophy of the basal-septal  segment. Left ventricular  diastolic parameters are indeterminate.  2. Right ventricular systolic function is normal. The right ventricular  size is normal. Tricuspid regurgitation signal is inadequate for assessing  PA pressure.  3. Left atrial size was mildly dilated.  4. The mitral valve is abnormal. Trivial mitral valve regurgitation. Mild  mitral stenosis. MG 73mHg at 44 bpm. Severe mitral annular calcification.  5. The aortic valve is tricuspid. Aortic valve regurgitation is mild.  Mild aortic valve stenosis. Vmax 2.2 m/s, MG 10 mmHg, AVA 1.4 cm^2, DI 0.6  6. The inferior vena cava is dilated in size with >50% respiratory  variability, suggesting right atrial pressure of 8 mmHg.    ASSESSMENT AND PLAN:  1.  Second-degree AV block: Status post Saint Jude dual-chamber pacemaker implanted 11/26/2020.  Device functioning appropriately.  Have adjusted outputs to chronic settings.  It turned on VIP as she is currently conducting one-to-one.  2.  Hypertension: Currently well controlled  3.  Diastolic heart failure: Plan per primary team  Current medicines are reviewed at length with the patient today.   The patient does not have concerns regarding her medicines.  The following changes were made today:  none  Labs/ tests ordered today include:  Orders Placed This Encounter  Procedures  . EKG 12-Lead     Disposition:   FU with Will Camnitz 9 months  Signed, Will Meredith Leeds, MD  03/16/2021 3:08 PM     [address] [place] [address] [phone] (office) [phone] (fax)

## 2021-03-24 DIAGNOSIS — E875 Hyperkalemia: Secondary | ICD-10-CM | POA: Diagnosis not present

## 2021-04-16 DIAGNOSIS — N390 Urinary tract infection, site not specified: Secondary | ICD-10-CM | POA: Diagnosis not present

## 2021-05-28 ENCOUNTER — Ambulatory Visit (INDEPENDENT_AMBULATORY_CARE_PROVIDER_SITE_OTHER): Payer: Medicare Other

## 2021-05-28 DIAGNOSIS — I442 Atrioventricular block, complete: Secondary | ICD-10-CM | POA: Diagnosis not present

## 2021-05-28 LAB — CUP PACEART REMOTE DEVICE CHECK
Battery Remaining Longevity: 130 mo
Battery Remaining Percentage: 95.5 %
Battery Voltage: 3.02 V
Brady Statistic AP VP Percent: 1 %
Brady Statistic AP VS Percent: 1 %
Brady Statistic AS VP Percent: 1 %
Brady Statistic AS VS Percent: 99 %
Brady Statistic RA Percent Paced: 1 %
Brady Statistic RV Percent Paced: 1 %
Date Time Interrogation Session: 20220609020020
Implantable Lead Implant Date: 20211208
Implantable Lead Implant Date: 20211208
Implantable Lead Location: 753859
Implantable Lead Location: 753860
Implantable Pulse Generator Implant Date: 20211208
Lead Channel Impedance Value: 410 Ohm
Lead Channel Impedance Value: 490 Ohm
Lead Channel Pacing Threshold Amplitude: 0.625 V
Lead Channel Pacing Threshold Amplitude: 0.875 V
Lead Channel Pacing Threshold Pulse Width: 0.5 ms
Lead Channel Pacing Threshold Pulse Width: 0.5 ms
Lead Channel Sensing Intrinsic Amplitude: 12 mV
Lead Channel Sensing Intrinsic Amplitude: 4.7 mV
Lead Channel Setting Pacing Amplitude: 1.125
Lead Channel Setting Pacing Amplitude: 1.625
Lead Channel Setting Pacing Pulse Width: 0.5 ms
Lead Channel Setting Sensing Sensitivity: 2 mV
Pulse Gen Model: 2272
Pulse Gen Serial Number: 3882125

## 2021-06-10 DIAGNOSIS — I25119 Atherosclerotic heart disease of native coronary artery with unspecified angina pectoris: Secondary | ICD-10-CM | POA: Diagnosis not present

## 2021-06-10 DIAGNOSIS — Z79899 Other long term (current) drug therapy: Secondary | ICD-10-CM | POA: Diagnosis not present

## 2021-06-10 DIAGNOSIS — E039 Hypothyroidism, unspecified: Secondary | ICD-10-CM | POA: Diagnosis not present

## 2021-06-10 DIAGNOSIS — E1122 Type 2 diabetes mellitus with diabetic chronic kidney disease: Secondary | ICD-10-CM | POA: Diagnosis not present

## 2021-06-10 DIAGNOSIS — I5030 Unspecified diastolic (congestive) heart failure: Secondary | ICD-10-CM | POA: Diagnosis not present

## 2021-06-10 DIAGNOSIS — D696 Thrombocytopenia, unspecified: Secondary | ICD-10-CM | POA: Diagnosis not present

## 2021-06-10 DIAGNOSIS — I129 Hypertensive chronic kidney disease with stage 1 through stage 4 chronic kidney disease, or unspecified chronic kidney disease: Secondary | ICD-10-CM | POA: Diagnosis not present

## 2021-06-10 DIAGNOSIS — N184 Chronic kidney disease, stage 4 (severe): Secondary | ICD-10-CM | POA: Diagnosis not present

## 2021-06-10 DIAGNOSIS — G301 Alzheimer's disease with late onset: Secondary | ICD-10-CM | POA: Diagnosis not present

## 2021-06-10 DIAGNOSIS — J449 Chronic obstructive pulmonary disease, unspecified: Secondary | ICD-10-CM | POA: Diagnosis not present

## 2021-06-10 DIAGNOSIS — E7800 Pure hypercholesterolemia, unspecified: Secondary | ICD-10-CM | POA: Diagnosis not present

## 2021-06-19 NOTE — Progress Notes (Signed)
Remote pacemaker transmission.   

## 2021-07-10 IMAGING — CR DG CHEST 2V
2 series · 2 of 2 positions shown · non-contrast
Comparison: 11/26/2020.  11/25/2020.

CLINICAL DATA: Cardiac device in situ.

EXAM:
CHEST - 2 VIEW

[chest lat]
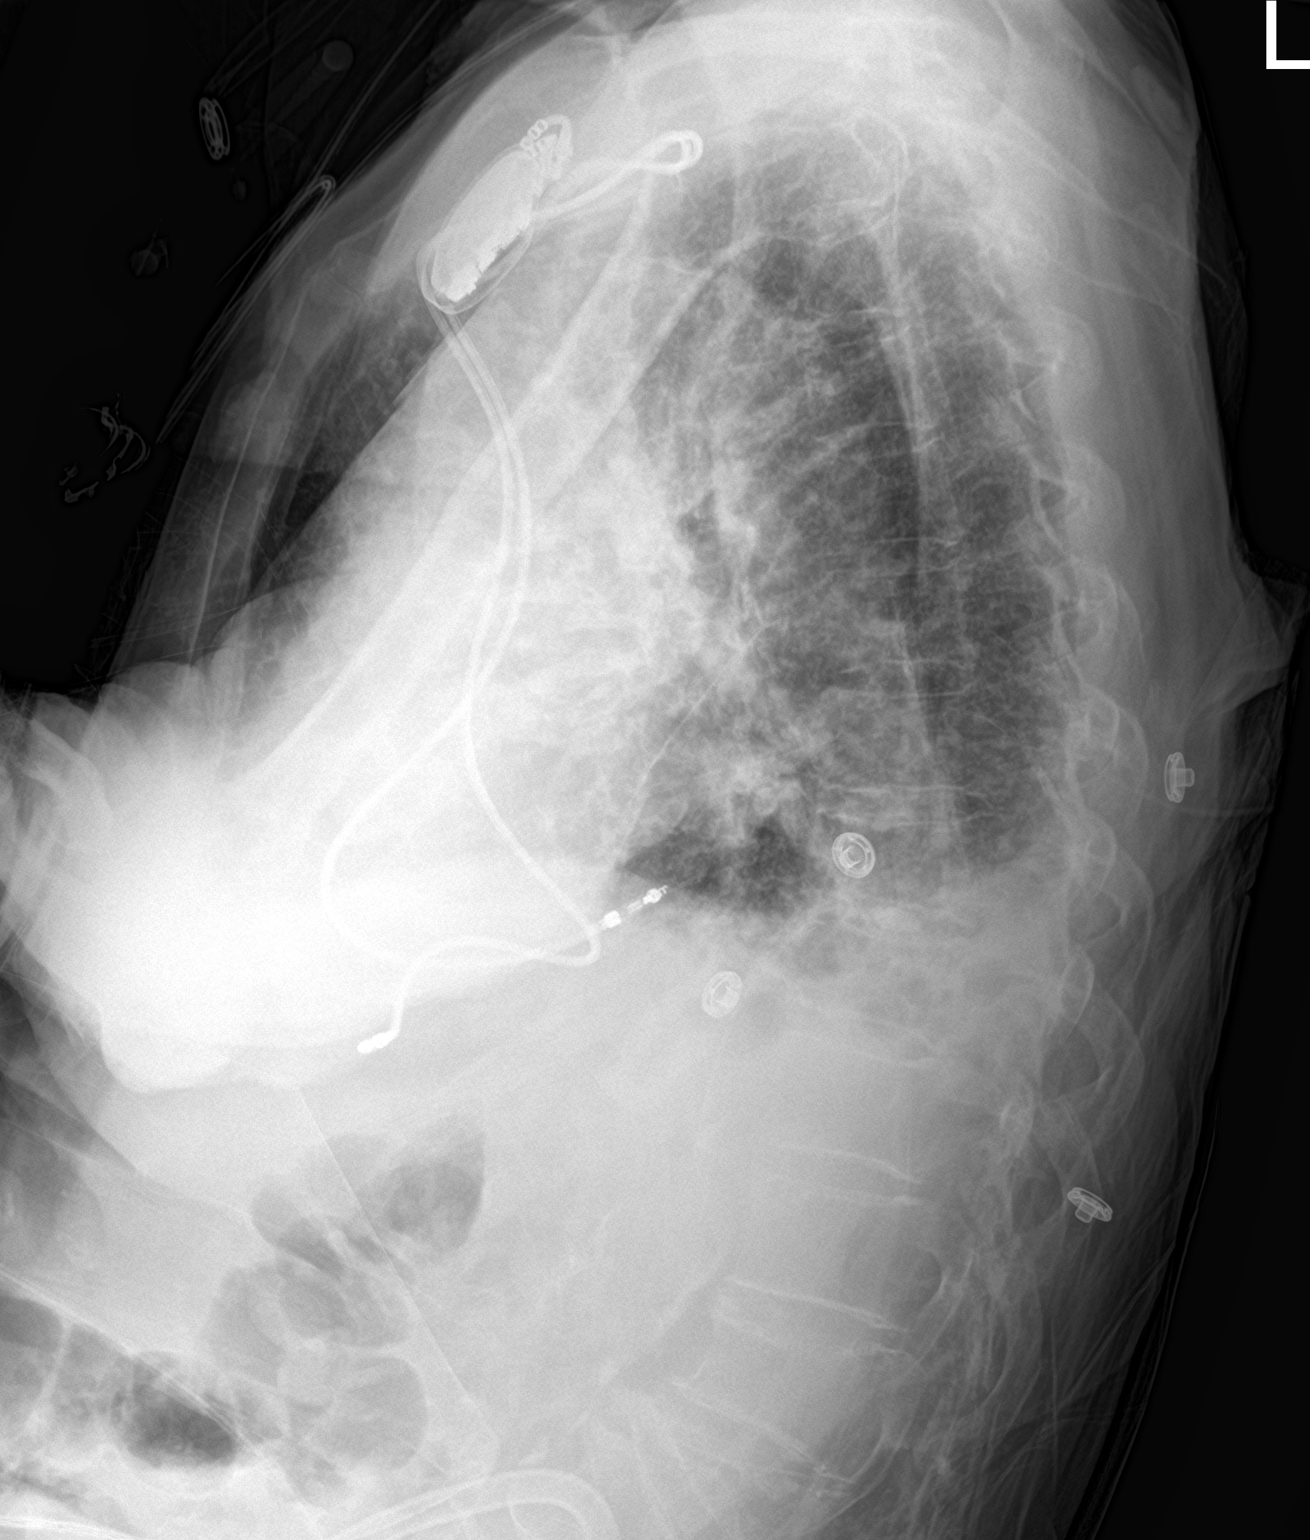

[chest ap]
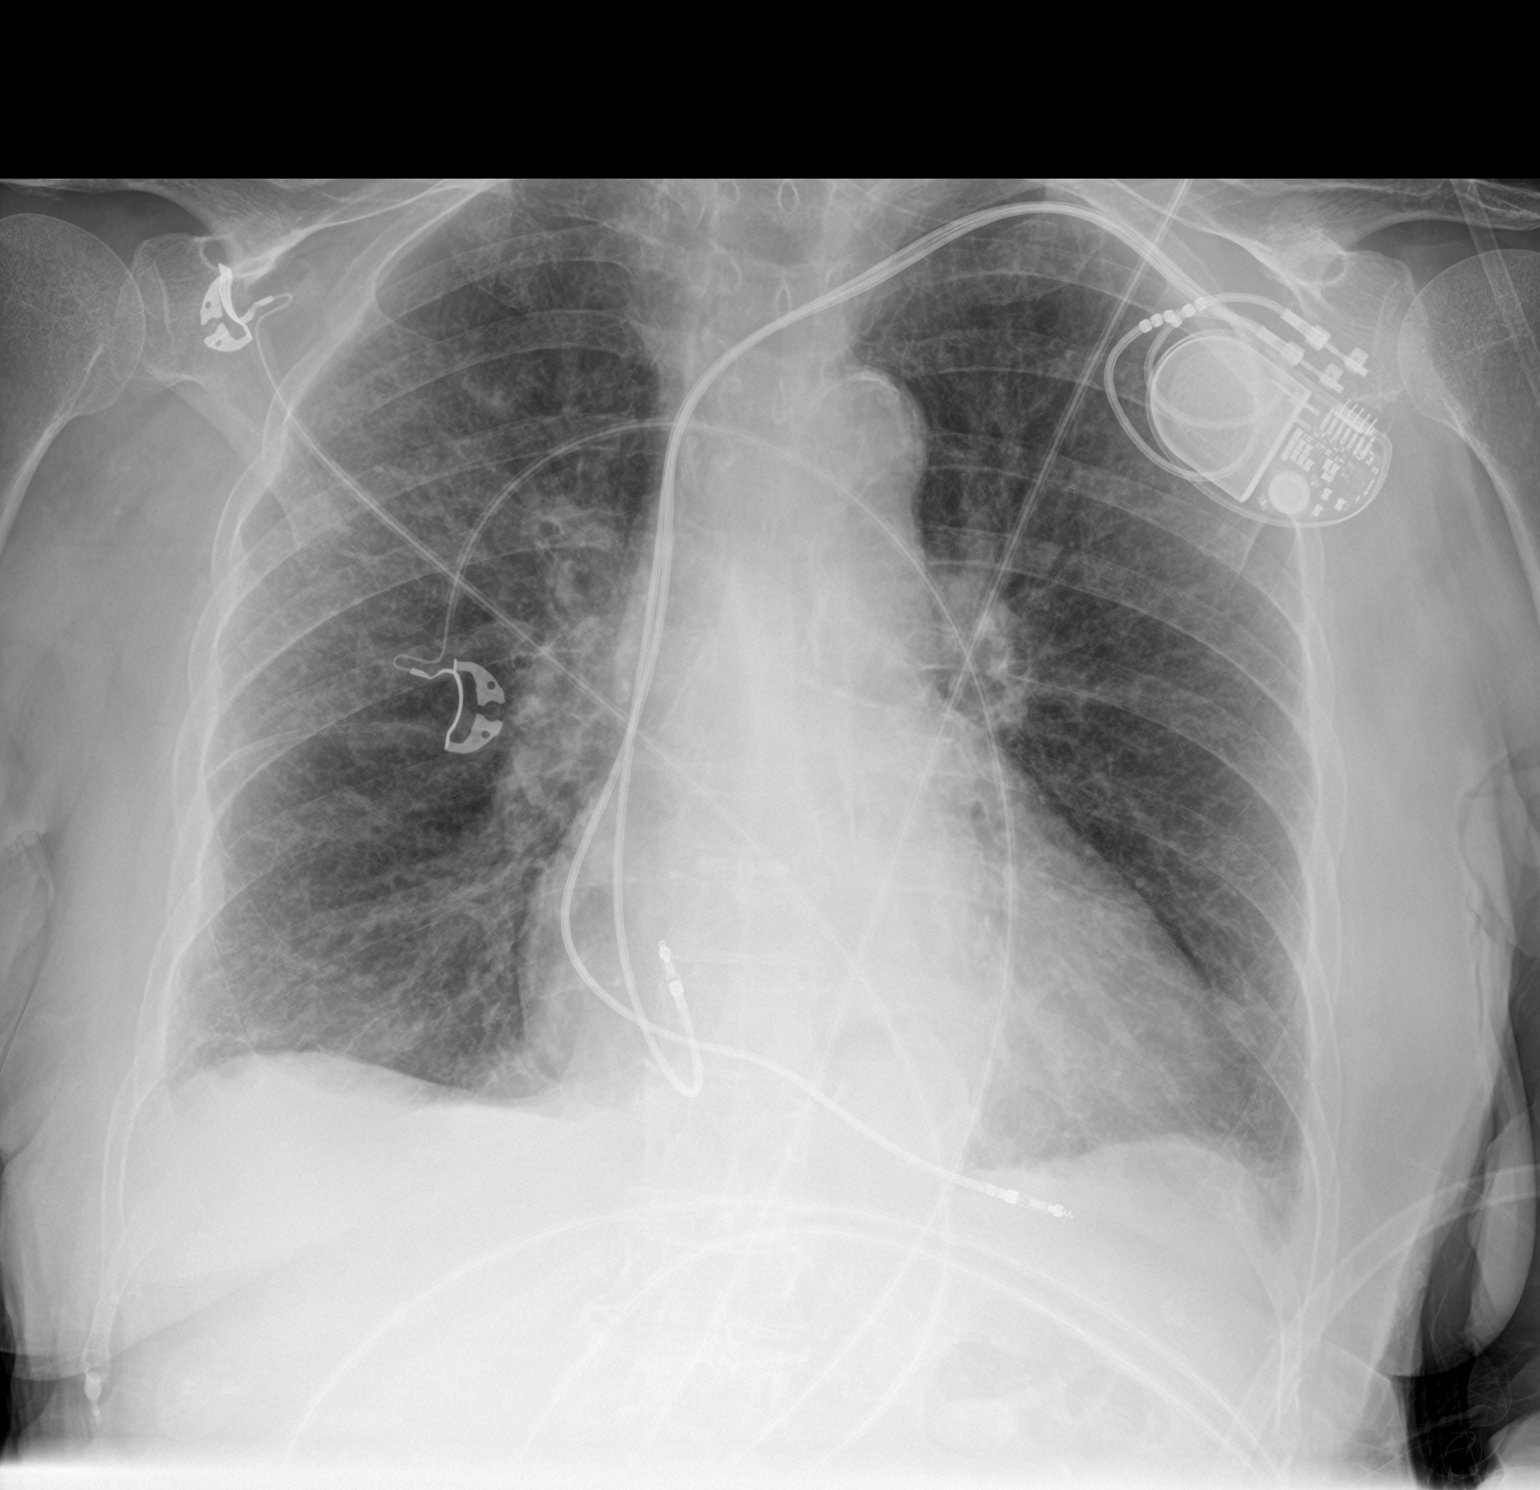

[2 of 2 positions shown; findings below may reference images not displayed]

FINDINGS: Cardiac pacer with lead tips over the right atrium right ventricle.
Stable cardiomegaly. Interim slight progression of bilateral
interstitial prominence suggesting interstitial edema. Persistent
small bilateral pleural effusions. No pneumothorax.
IMPRESSION: 1. Cardiac pacer with lead tips over the right atrium and right
ventricle. No pneumothorax.

2. Stable cardiomegaly. Interim slight progression of bilateral
interstitial prominence suggesting interstitial edema. Small
bilateral pleural effusions again noted.

## 2021-08-14 DIAGNOSIS — I5032 Chronic diastolic (congestive) heart failure: Secondary | ICD-10-CM | POA: Diagnosis not present

## 2021-08-14 DIAGNOSIS — N184 Chronic kidney disease, stage 4 (severe): Secondary | ICD-10-CM | POA: Diagnosis not present

## 2021-08-14 DIAGNOSIS — R0902 Hypoxemia: Secondary | ICD-10-CM | POA: Diagnosis not present

## 2021-08-14 DIAGNOSIS — I129 Hypertensive chronic kidney disease with stage 1 through stage 4 chronic kidney disease, or unspecified chronic kidney disease: Secondary | ICD-10-CM | POA: Diagnosis not present

## 2021-08-14 DIAGNOSIS — Z79899 Other long term (current) drug therapy: Secondary | ICD-10-CM | POA: Diagnosis not present

## 2021-08-18 DIAGNOSIS — N184 Chronic kidney disease, stage 4 (severe): Secondary | ICD-10-CM | POA: Diagnosis not present

## 2021-08-18 DIAGNOSIS — I129 Hypertensive chronic kidney disease with stage 1 through stage 4 chronic kidney disease, or unspecified chronic kidney disease: Secondary | ICD-10-CM | POA: Diagnosis not present

## 2021-08-18 DIAGNOSIS — I5030 Unspecified diastolic (congestive) heart failure: Secondary | ICD-10-CM | POA: Diagnosis not present

## 2021-08-18 DIAGNOSIS — J449 Chronic obstructive pulmonary disease, unspecified: Secondary | ICD-10-CM | POA: Diagnosis not present

## 2021-08-26 IMAGING — CR DG CHEST 2V
2 series · 2 of 2 positions shown · non-contrast
Comparison: 11/27/2020

CLINICAL DATA: Chest pain

EXAM:
CHEST - 2 VIEW

[chest lat]
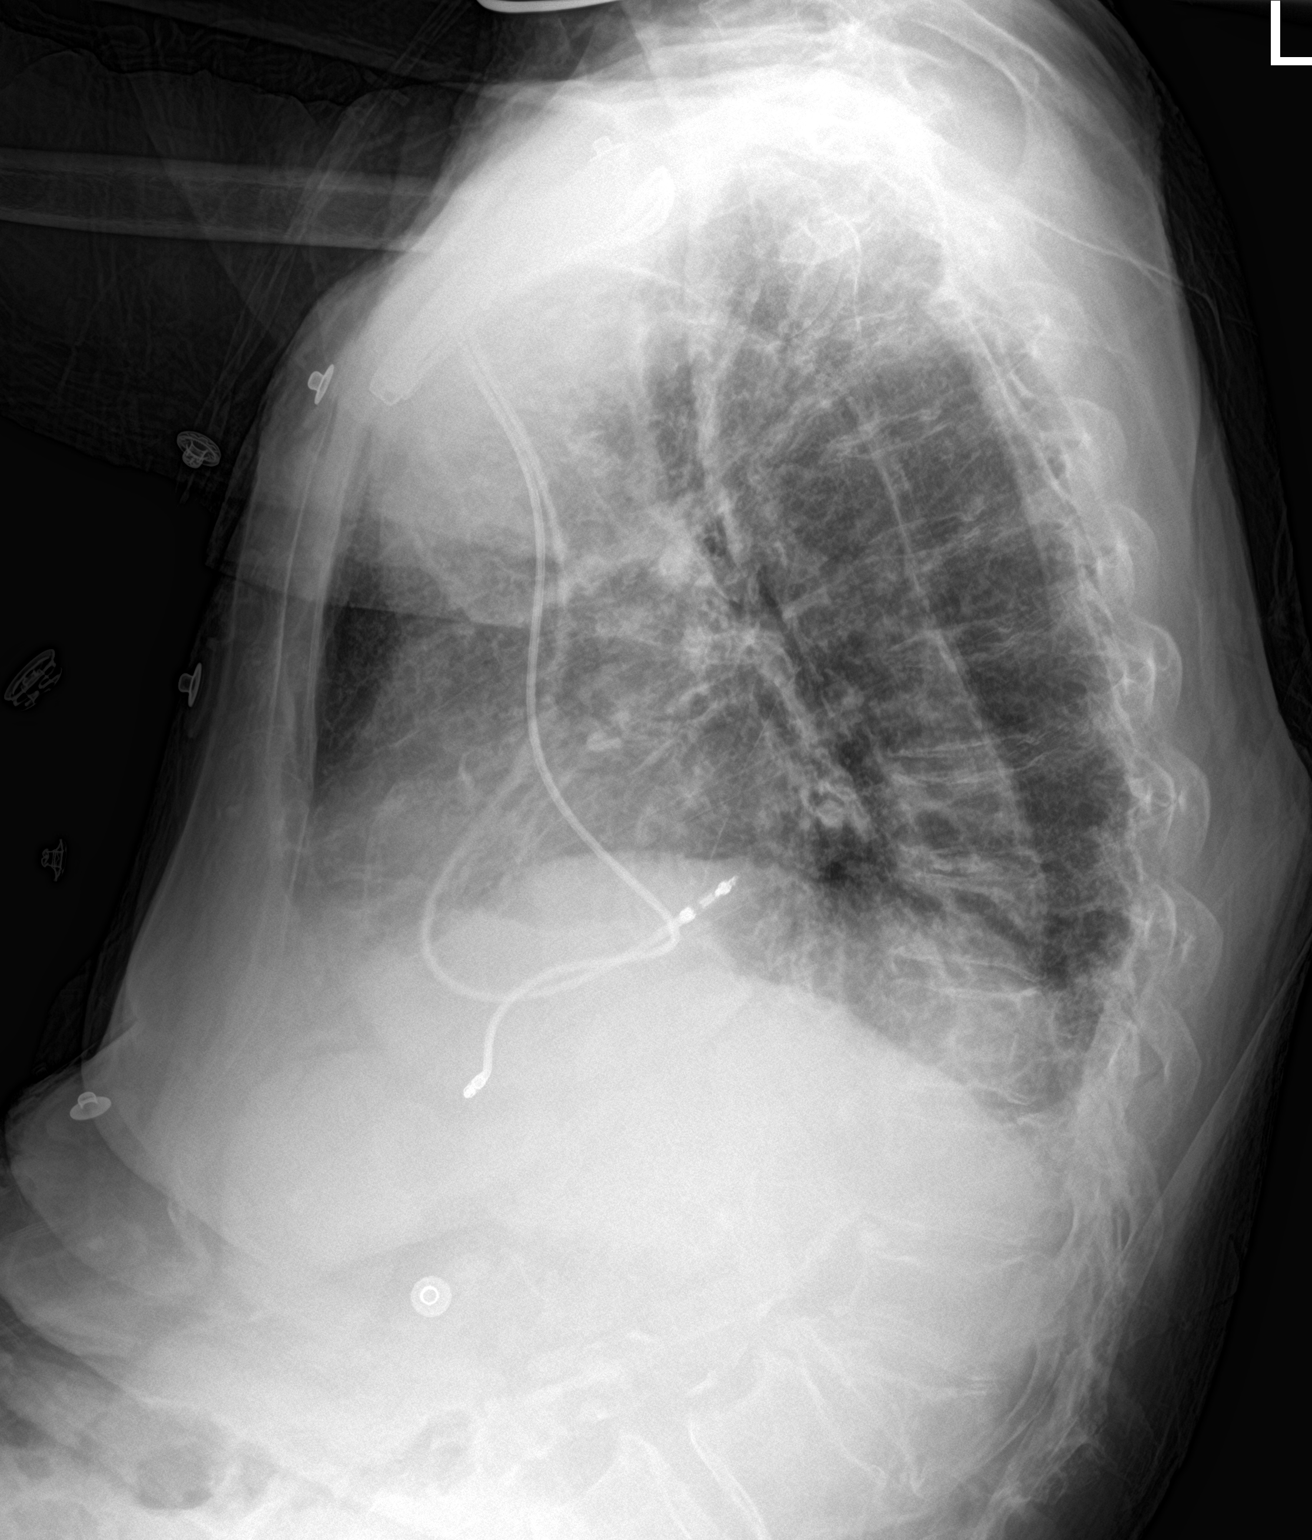

[chest ap]
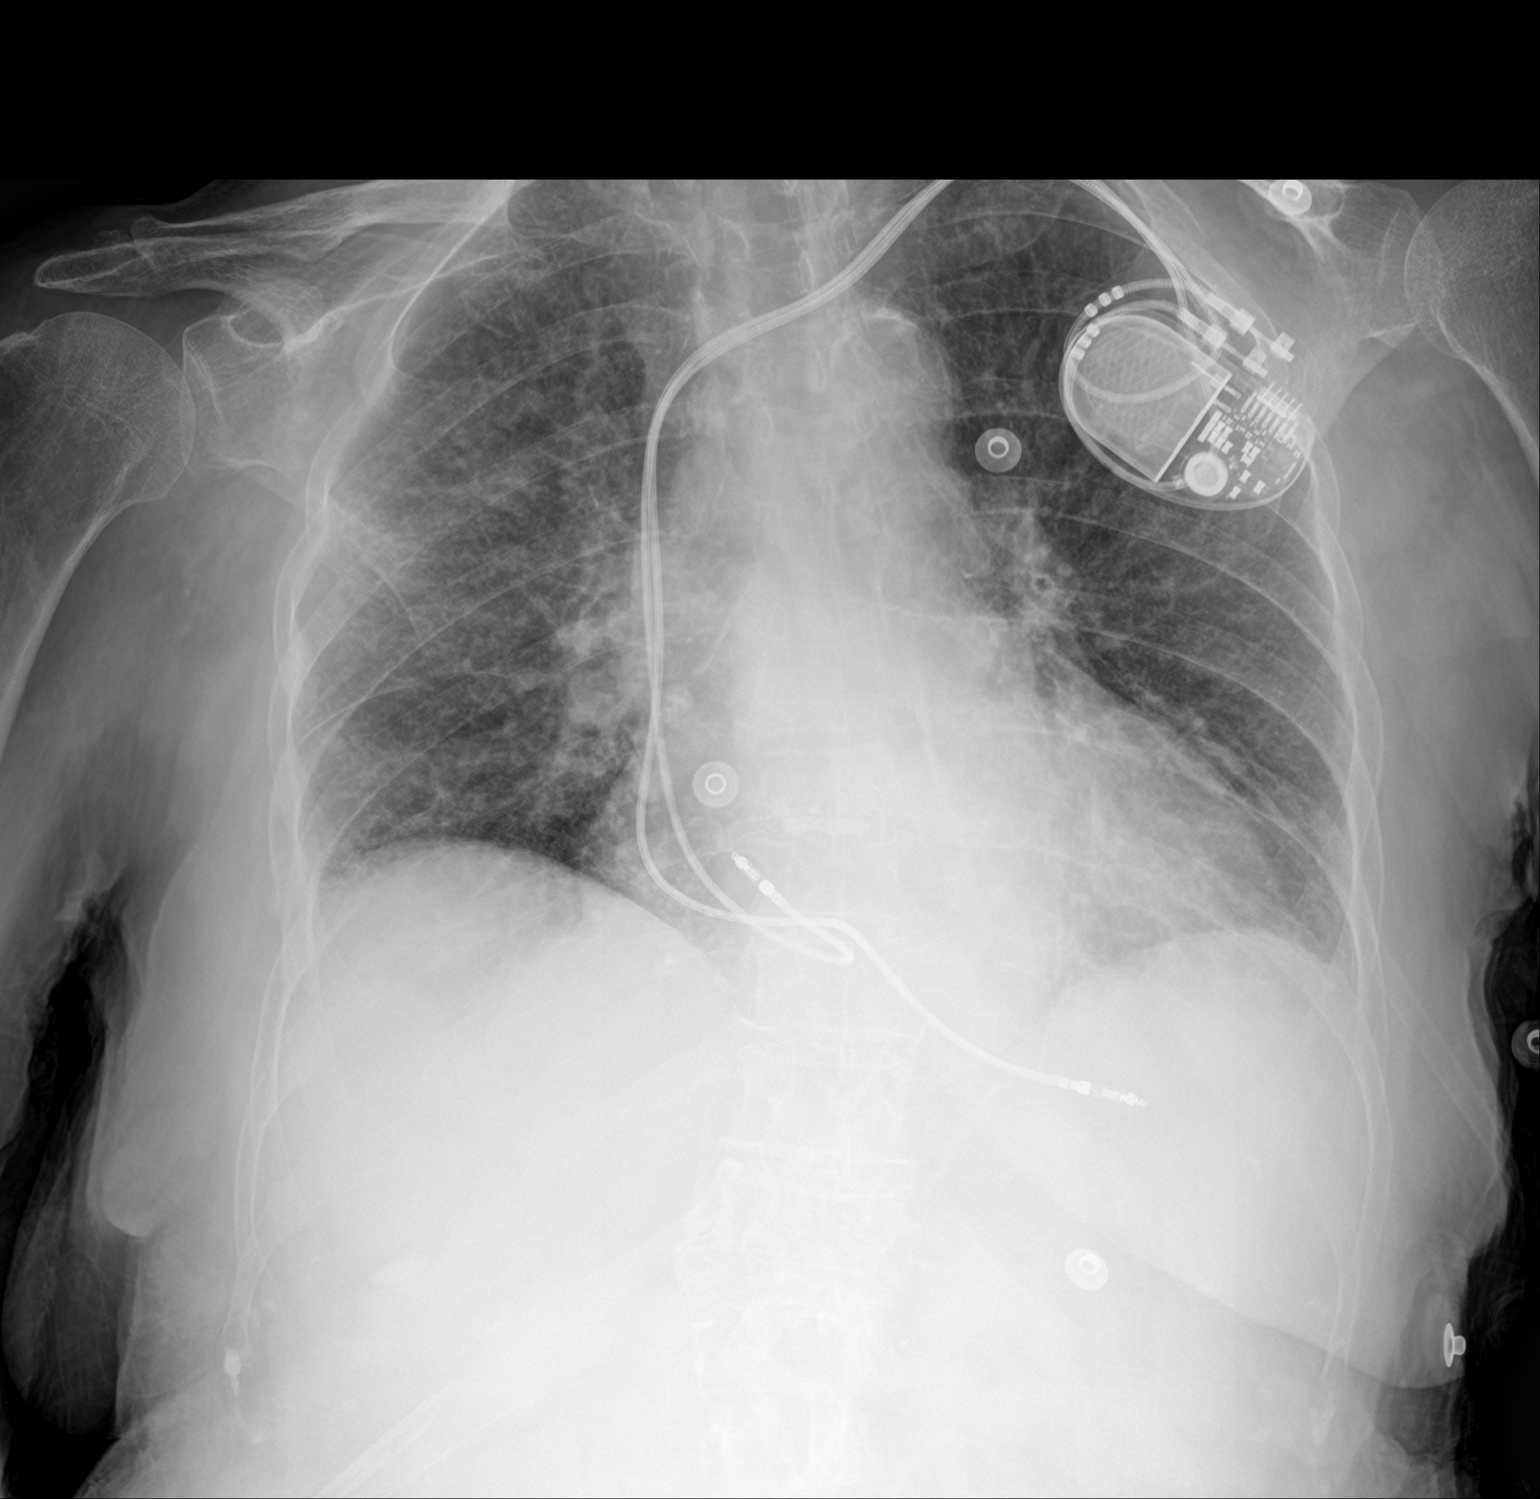

[2 of 2 positions shown; findings below may reference images not displayed]

FINDINGS: Mild cardiomegaly with prominent interstitial markings, possibly
mild pulmonary edema. No pleural effusion or pneumothorax. No focal
airspace consolidation. Unchanged thoracolumbar junction compression
fracture. Unchanged position of pacemaker.
IMPRESSION: Cardiomegaly with possible mild pulmonary edema.

## 2021-08-27 ENCOUNTER — Ambulatory Visit (INDEPENDENT_AMBULATORY_CARE_PROVIDER_SITE_OTHER): Payer: Medicare Other

## 2021-08-27 DIAGNOSIS — I441 Atrioventricular block, second degree: Secondary | ICD-10-CM | POA: Diagnosis not present

## 2021-08-27 LAB — CUP PACEART REMOTE DEVICE CHECK
Battery Remaining Longevity: 127 mo
Battery Remaining Percentage: 95.5 %
Battery Voltage: 3.02 V
Brady Statistic AP VP Percent: 1 %
Brady Statistic AP VS Percent: 1 %
Brady Statistic AS VP Percent: 1 %
Brady Statistic AS VS Percent: 99 %
Brady Statistic RA Percent Paced: 1 %
Brady Statistic RV Percent Paced: 1 %
Date Time Interrogation Session: 20220908020012
Implantable Lead Implant Date: 20211208
Implantable Lead Implant Date: 20211208
Implantable Lead Location: 753859
Implantable Lead Location: 753860
Implantable Pulse Generator Implant Date: 20211208
Lead Channel Impedance Value: 410 Ohm
Lead Channel Impedance Value: 510 Ohm
Lead Channel Pacing Threshold Amplitude: 0.625 V
Lead Channel Pacing Threshold Amplitude: 0.875 V
Lead Channel Pacing Threshold Pulse Width: 0.5 ms
Lead Channel Pacing Threshold Pulse Width: 0.5 ms
Lead Channel Sensing Intrinsic Amplitude: 12 mV
Lead Channel Sensing Intrinsic Amplitude: 4.9 mV
Lead Channel Setting Pacing Amplitude: 1.125
Lead Channel Setting Pacing Amplitude: 1.625
Lead Channel Setting Pacing Pulse Width: 0.5 ms
Lead Channel Setting Sensing Sensitivity: 2 mV
Pulse Gen Model: 2272
Pulse Gen Serial Number: 3882125

## 2021-09-04 NOTE — Progress Notes (Signed)
Remote pacemaker transmission.   

## 2021-10-02 ENCOUNTER — Telehealth: Payer: Self-pay | Admitting: Cardiology

## 2021-10-02 NOTE — Telephone Encounter (Signed)
Pt c/o Shortness Of Breath: STAT if SOB developed within the last 24 hours or pt is noticeably SOB on the phone  1. Are you currently SOB (can you hear that pt is SOB on the phone)? Patient was SOB this morning   2. How long have you been experiencing SOB? A couple weeks  3. Are you SOB when sitting or when up moving around? With exertion  4. Are you currently experiencing any other symptoms? No   Daughter said over the past couple weeks the patient has been SOB on exertion. The patient told her daughter that she got very tired doing her laundry last week, so now her daughter is doing it for her.

## 2021-10-02 NOTE — Telephone Encounter (Signed)
Spoke with pt's daughter who states she was trying to schedule an appointment for her mother with Dr Curt Bears for routine follow up due to letter received and was asked questions by the scheduler that led to the message to triage below.   Pt's daughter states pt's SOB was more noticeable last week but seems to be back to base line this week.  She would like to schedule appointment. Rn suggested that pt send a remote transmission for evaluation.  Pt's daughter states they will need assistance with that and it is fine for them to not be contacted until Monday.  Daughter requests that they be contacted after 1230pm. She thanked Therapist, sports for the call back.

## 2021-10-05 NOTE — Telephone Encounter (Signed)
The patient daughter states she will call us back when she is with the patient to get transmission.

## 2021-10-05 NOTE — Telephone Encounter (Signed)
Waiting on transmission. 

## 2021-10-05 NOTE — Telephone Encounter (Signed)
LMOVM for the patient daughter to give [place] a call back.

## 2021-10-06 ENCOUNTER — Telehealth: Payer: Self-pay

## 2021-10-06 NOTE — Telephone Encounter (Signed)
Patient daughter called in and has sent that transmission and would like a call back

## 2021-10-06 NOTE — Telephone Encounter (Signed)
Open in error

## 2021-10-06 NOTE — Telephone Encounter (Signed)
Successful telephone encounter to patient and daughter to discuss transmission sent in today. Daughter states patient has been having intermittent increases in her shortness of breath. Patient doing well today. Transmission reviewed and not episodes noted. All readings normal. Presenting rhythm sinus at 65. Daughter appreciative of call. Will forward to scheduling as patient received recall notification. Daughter and patient appreciative of call.

## 2021-10-10 ENCOUNTER — Encounter (HOSPITAL_COMMUNITY): Payer: Self-pay

## 2021-10-10 ENCOUNTER — Other Ambulatory Visit: Payer: Self-pay

## 2021-10-10 ENCOUNTER — Inpatient Hospital Stay (HOSPITAL_COMMUNITY)
Admission: EM | Admit: 2021-10-10 | Discharge: 2021-10-12 | DRG: 190 | Disposition: A | Discharge status: Home / Self Care | Payer: Medicare Other | Attending: Internal Medicine | Admitting: Internal Medicine

## 2021-10-10 ENCOUNTER — Emergency Department (HOSPITAL_COMMUNITY): Payer: Medicare Other

## 2021-10-10 DIAGNOSIS — Z950 Presence of cardiac pacemaker: Secondary | ICD-10-CM

## 2021-10-10 DIAGNOSIS — D539 Nutritional anemia, unspecified: Secondary | ICD-10-CM | POA: Diagnosis not present

## 2021-10-10 DIAGNOSIS — J441 Chronic obstructive pulmonary disease with (acute) exacerbation: Secondary | ICD-10-CM | POA: Diagnosis not present

## 2021-10-10 DIAGNOSIS — G939 Disorder of brain, unspecified: Secondary | ICD-10-CM | POA: Diagnosis not present

## 2021-10-10 DIAGNOSIS — R062 Wheezing: Secondary | ICD-10-CM | POA: Diagnosis not present

## 2021-10-10 DIAGNOSIS — D696 Thrombocytopenia, unspecified: Secondary | ICD-10-CM | POA: Diagnosis present

## 2021-10-10 DIAGNOSIS — I352 Nonrheumatic aortic (valve) stenosis with insufficiency: Secondary | ICD-10-CM | POA: Diagnosis not present

## 2021-10-10 DIAGNOSIS — Z66 Do not resuscitate: Secondary | ICD-10-CM | POA: Diagnosis not present

## 2021-10-10 DIAGNOSIS — N184 Chronic kidney disease, stage 4 (severe): Secondary | ICD-10-CM | POA: Diagnosis present

## 2021-10-10 DIAGNOSIS — D649 Anemia, unspecified: Secondary | ICD-10-CM | POA: Diagnosis present

## 2021-10-10 DIAGNOSIS — D631 Anemia in chronic kidney disease: Secondary | ICD-10-CM | POA: Diagnosis present

## 2021-10-10 DIAGNOSIS — I129 Hypertensive chronic kidney disease with stage 1 through stage 4 chronic kidney disease, or unspecified chronic kidney disease: Secondary | ICD-10-CM | POA: Diagnosis present

## 2021-10-10 DIAGNOSIS — R0902 Hypoxemia: Secondary | ICD-10-CM

## 2021-10-10 DIAGNOSIS — Z87891 Personal history of nicotine dependence: Secondary | ICD-10-CM

## 2021-10-10 DIAGNOSIS — Z888 Allergy status to other drugs, medicaments and biological substances status: Secondary | ICD-10-CM

## 2021-10-10 DIAGNOSIS — E039 Hypothyroidism, unspecified: Secondary | ICD-10-CM | POA: Diagnosis not present

## 2021-10-10 DIAGNOSIS — E875 Hyperkalemia: Secondary | ICD-10-CM | POA: Diagnosis not present

## 2021-10-10 DIAGNOSIS — Z823 Family history of stroke: Secondary | ICD-10-CM | POA: Diagnosis not present

## 2021-10-10 DIAGNOSIS — Z743 Need for continuous supervision: Secondary | ICD-10-CM | POA: Diagnosis not present

## 2021-10-10 DIAGNOSIS — R413 Other amnesia: Secondary | ICD-10-CM | POA: Diagnosis not present

## 2021-10-10 DIAGNOSIS — Z79899 Other long term (current) drug therapy: Secondary | ICD-10-CM

## 2021-10-10 DIAGNOSIS — I252 Old myocardial infarction: Secondary | ICD-10-CM

## 2021-10-10 DIAGNOSIS — J9621 Acute and chronic respiratory failure with hypoxia: Secondary | ICD-10-CM | POA: Diagnosis present

## 2021-10-10 DIAGNOSIS — Z9049 Acquired absence of other specified parts of digestive tract: Secondary | ICD-10-CM

## 2021-10-10 DIAGNOSIS — Z809 Family history of malignant neoplasm, unspecified: Secondary | ICD-10-CM

## 2021-10-10 DIAGNOSIS — J9601 Acute respiratory failure with hypoxia: Secondary | ICD-10-CM | POA: Diagnosis not present

## 2021-10-10 DIAGNOSIS — I10 Essential (primary) hypertension: Secondary | ICD-10-CM | POA: Diagnosis present

## 2021-10-10 DIAGNOSIS — I5189 Other ill-defined heart diseases: Secondary | ICD-10-CM

## 2021-10-10 DIAGNOSIS — I2510 Atherosclerotic heart disease of native coronary artery without angina pectoris: Secondary | ICD-10-CM | POA: Diagnosis not present

## 2021-10-10 DIAGNOSIS — R6889 Other general symptoms and signs: Secondary | ICD-10-CM | POA: Diagnosis not present

## 2021-10-10 DIAGNOSIS — R0602 Shortness of breath: Secondary | ICD-10-CM | POA: Diagnosis not present

## 2021-10-10 DIAGNOSIS — Z20822 Contact with and (suspected) exposure to covid-19: Secondary | ICD-10-CM | POA: Diagnosis not present

## 2021-10-10 DIAGNOSIS — Z79890 Hormone replacement therapy (postmenopausal): Secondary | ICD-10-CM

## 2021-10-10 DIAGNOSIS — E785 Hyperlipidemia, unspecified: Secondary | ICD-10-CM | POA: Diagnosis not present

## 2021-10-10 HISTORY — DX: Atherosclerotic heart disease of native coronary artery without angina pectoris: I25.10

## 2021-10-10 LAB — CBC WITH DIFFERENTIAL/PLATELET
Abs Immature Granulocytes: 0.01 10*3/uL (ref 0.00–0.07)
Basophils Absolute: 0 10*3/uL (ref 0.0–0.1)
Basophils Relative: 0 %
Eosinophils Absolute: 0 10*3/uL (ref 0.0–0.5)
Eosinophils Relative: 0 %
HCT: 33.6 % — ABNORMAL LOW (ref 36.0–46.0)
Hemoglobin: 10.6 g/dL — ABNORMAL LOW (ref 12.0–15.0)
Immature Granulocytes: 0 %
Lymphocytes Relative: 29 %
Lymphs Abs: 1.4 10*3/uL (ref 0.7–4.0)
MCH: 31.9 pg (ref 26.0–34.0)
MCHC: 31.5 g/dL (ref 30.0–36.0)
MCV: 101.2 fL — ABNORMAL HIGH (ref 80.0–100.0)
Monocytes Absolute: 0.9 10*3/uL (ref 0.1–1.0)
Monocytes Relative: 18 %
Neutro Abs: 2.5 10*3/uL (ref 1.7–7.7)
Neutrophils Relative %: 53 %
Platelets: 133 10*3/uL — ABNORMAL LOW (ref 150–400)
RBC: 3.32 MIL/uL — ABNORMAL LOW (ref 3.87–5.11)
RDW: 14.6 % (ref 11.5–15.5)
WBC: 4.8 10*3/uL (ref 4.0–10.5)
nRBC: 0 % (ref 0.0–0.2)

## 2021-10-10 LAB — BLOOD GAS, VENOUS
Acid-base deficit: 2.5 mmol/L — ABNORMAL HIGH (ref 0.0–2.0)
Bicarbonate: 21.8 mmol/L (ref 20.0–28.0)
O2 Saturation: 88.2 %
Patient temperature: 98.6
pCO2, Ven: 38.3 mmHg — ABNORMAL LOW (ref 44.0–60.0)
pH, Ven: 7.374 (ref 7.250–7.430)
pO2, Ven: 58.5 mmHg — ABNORMAL HIGH (ref 32.0–45.0)

## 2021-10-10 LAB — BASIC METABOLIC PANEL
Anion gap: 7 (ref 5–15)
BUN: 43 mg/dL — ABNORMAL HIGH (ref 8–23)
CO2: 19 mmol/L — ABNORMAL LOW (ref 22–32)
Calcium: 8.1 mg/dL — ABNORMAL LOW (ref 8.9–10.3)
Chloride: 111 mmol/L (ref 98–111)
Creatinine, Ser: 1.87 mg/dL — ABNORMAL HIGH (ref 0.44–1.00)
GFR, Estimated: 25 mL/min — ABNORMAL LOW (ref >60)
Glucose, Bld: 88 mg/dL (ref 70–99)
Potassium: 5.4 mmol/L — ABNORMAL HIGH (ref 3.5–5.1)
Sodium: 137 mmol/L (ref 135–145)

## 2021-10-10 LAB — RESP PANEL BY RT-PCR (FLU A&B, COVID) ARPGX2
Influenza A by PCR: NEGATIVE
Influenza B by PCR: NEGATIVE
SARS Coronavirus 2 by RT PCR: NEGATIVE

## 2021-10-10 LAB — POTASSIUM: Potassium: 5.4 mmol/L — ABNORMAL HIGH (ref 3.5–5.1)

## 2021-10-10 MED ORDER — LEVOTHYROXINE SODIUM 75 MCG PO TABS
75.0000 ug | ORAL_TABLET | Freq: Every day | ORAL | Status: DC
  Administered 2021-10-11 – 2021-10-12 (×2): 75 ug via ORAL
  Filled 2021-10-10 (×2): qty 1

## 2021-10-10 MED ORDER — FLUOCINONIDE 0.05 % EX CREA
1.0000 "application " | TOPICAL_CREAM | Freq: Two times a day (BID) | CUTANEOUS | Status: DC
  Administered 2021-10-10 – 2021-10-12 (×4): 1 via TOPICAL
  Filled 2021-10-10 (×3): qty 15

## 2021-10-10 MED ORDER — MEMANTINE HCL 10 MG PO TABS
5.0000 mg | ORAL_TABLET | Freq: Two times a day (BID) | ORAL | Status: DC
  Administered 2021-10-10 – 2021-10-12 (×4): 5 mg via ORAL
  Filled 2021-10-10 (×4): qty 1

## 2021-10-10 MED ORDER — NITROGLYCERIN 0.4 MG SL SUBL: 0.4000 mg | SUBLINGUAL_TABLET | SUBLINGUAL | Status: DC | PRN

## 2021-10-10 MED ORDER — ISOSORBIDE MONONITRATE ER 30 MG PO TB24
30.0000 mg | ORAL_TABLET | Freq: Every day | ORAL | Status: DC
  Administered 2021-10-11 – 2021-10-12 (×2): 30 mg via ORAL
  Filled 2021-10-10 (×3): qty 1

## 2021-10-10 MED ORDER — FERROUS SULFATE 325 (65 FE) MG PO TABS
325.0000 mg | ORAL_TABLET | Freq: Every day | ORAL | Status: DC
  Administered 2021-10-11 – 2021-10-12 (×2): 325 mg via ORAL
  Filled 2021-10-10 (×4): qty 1

## 2021-10-10 MED ORDER — CARVEDILOL 6.25 MG PO TABS: 6.2500 mg | ORAL_TABLET | Freq: Two times a day (BID) | ORAL | Status: DC

## 2021-10-10 MED ORDER — PRAMIPEXOLE DIHYDROCHLORIDE 0.25 MG PO TABS
0.2500 mg | ORAL_TABLET | Freq: Every day | ORAL | Status: DC
  Administered 2021-10-11 – 2021-10-12 (×2): 0.25 mg via ORAL
  Filled 2021-10-10 (×2): qty 1

## 2021-10-10 MED ORDER — ALBUTEROL SULFATE (2.5 MG/3ML) 0.083% IN NEBU
5.0000 mg | INHALATION_SOLUTION | Freq: Once | RESPIRATORY_TRACT | Status: AC
  Administered 2021-10-10: 5 mg via RESPIRATORY_TRACT
  Filled 2021-10-10: qty 6

## 2021-10-10 MED ORDER — DOCUSATE SODIUM 100 MG PO CAPS
200.0000 mg | ORAL_CAPSULE | Freq: Every day | ORAL | Status: DC
  Administered 2021-10-11 – 2021-10-12 (×2): 200 mg via ORAL
  Filled 2021-10-10 (×3): qty 2

## 2021-10-10 MED ORDER — MELATONIN 3 MG PO TABS
3.0000 mg | ORAL_TABLET | Freq: Every evening | ORAL | Status: DC | PRN
  Administered 2021-10-10: 3 mg via ORAL
  Filled 2021-10-10: qty 1

## 2021-10-10 MED ORDER — ENOXAPARIN SODIUM 30 MG/0.3ML IJ SOSY: 30.0000 mg | PREFILLED_SYRINGE | INTRAMUSCULAR | Status: DC

## 2021-10-10 MED ORDER — ACETAMINOPHEN 325 MG PO TABS
650.0000 mg | ORAL_TABLET | Freq: Four times a day (QID) | ORAL | Status: DC | PRN
  Administered 2021-10-10: 650 mg via ORAL
  Filled 2021-10-10: qty 2

## 2021-10-10 MED ORDER — ONDANSETRON HCL 4 MG PO TABS: 4.0000 mg | ORAL_TABLET | Freq: Four times a day (QID) | ORAL | Status: DC | PRN

## 2021-10-10 MED ORDER — AMLODIPINE BESYLATE 5 MG PO TABS
2.5000 mg | ORAL_TABLET | Freq: Every day | ORAL | Status: DC
  Administered 2021-10-11 – 2021-10-12 (×2): 2.5 mg via ORAL
  Filled 2021-10-10 (×3): qty 1

## 2021-10-10 MED ORDER — MAGNESIUM SULFATE 2 GM/50ML IV SOLN
2.0000 g | Freq: Once | INTRAVENOUS | Status: AC
  Administered 2021-10-10: 2 g via INTRAVENOUS
  Filled 2021-10-10: qty 50

## 2021-10-10 MED ORDER — HYDRALAZINE HCL 25 MG PO TABS
25.0000 mg | ORAL_TABLET | Freq: Four times a day (QID) | ORAL | Status: DC | PRN
  Administered 2021-10-10 – 2021-10-11 (×2): 25 mg via ORAL
  Filled 2021-10-10 (×2): qty 1

## 2021-10-10 MED ORDER — PREDNISONE 20 MG PO TABS
40.0000 mg | ORAL_TABLET | Freq: Every day | ORAL | Status: DC
  Administered 2021-10-11 – 2021-10-12 (×2): 40 mg via ORAL
  Filled 2021-10-10 (×2): qty 2

## 2021-10-10 MED ORDER — TRAMADOL HCL 50 MG PO TABS
50.0000 mg | ORAL_TABLET | Freq: Every day | ORAL | Status: DC | PRN
  Administered 2021-10-10 – 2021-10-11 (×2): 50 mg via ORAL
  Filled 2021-10-10 (×2): qty 1

## 2021-10-10 MED ORDER — ALBUTEROL SULFATE (2.5 MG/3ML) 0.083% IN NEBU
2.5000 mg | INHALATION_SOLUTION | RESPIRATORY_TRACT | Status: DC | PRN
  Administered 2021-10-11 (×2): 2.5 mg via RESPIRATORY_TRACT
  Filled 2021-10-10 (×3): qty 3

## 2021-10-10 MED ORDER — IPRATROPIUM-ALBUTEROL 0.5-2.5 (3) MG/3ML IN SOLN
3.0000 mL | Freq: Four times a day (QID) | RESPIRATORY_TRACT | Status: DC
  Administered 2021-10-10 – 2021-10-12 (×6): 3 mL via RESPIRATORY_TRACT
  Filled 2021-10-10 (×6): qty 3

## 2021-10-10 MED ORDER — ATORVASTATIN CALCIUM 10 MG PO TABS
10.0000 mg | ORAL_TABLET | Freq: Every day | ORAL | Status: DC
  Administered 2021-10-11 – 2021-10-12 (×2): 10 mg via ORAL
  Filled 2021-10-10 (×3): qty 1

## 2021-10-10 MED ORDER — PANTOPRAZOLE SODIUM 40 MG PO TBEC: 40.0000 mg | DELAYED_RELEASE_TABLET | Freq: Two times a day (BID) | ORAL | Status: DC

## 2021-10-10 MED ORDER — CARVEDILOL 12.5 MG PO TABS
12.5000 mg | ORAL_TABLET | Freq: Two times a day (BID) | ORAL | Status: DC
  Administered 2021-10-10 – 2021-10-12 (×5): 12.5 mg via ORAL
  Filled 2021-10-10 (×5): qty 1

## 2021-10-10 MED ORDER — SODIUM BICARBONATE 650 MG PO TABS
1300.0000 mg | ORAL_TABLET | Freq: Two times a day (BID) | ORAL | Status: DC
  Administered 2021-10-10 – 2021-10-12 (×4): 1300 mg via ORAL
  Filled 2021-10-10 (×4): qty 2

## 2021-10-10 MED ORDER — PREDNISONE 20 MG PO TABS
60.0000 mg | ORAL_TABLET | Freq: Once | ORAL | Status: AC
  Administered 2021-10-10: 60 mg via ORAL
  Filled 2021-10-10: qty 3

## 2021-10-10 MED ORDER — SODIUM CHLORIDE 0.9 % IV SOLN: INTRAVENOUS | Status: DC

## 2021-10-10 MED ORDER — VITAMIN B-12 1000 MCG PO TABS: 1000.0000 ug | ORAL_TABLET | Freq: Every day | ORAL | Status: DC

## 2021-10-10 MED ORDER — IPRATROPIUM BROMIDE 0.02 % IN SOLN
0.5000 mg | Freq: Once | RESPIRATORY_TRACT | Status: AC
  Administered 2021-10-10: 0.5 mg via RESPIRATORY_TRACT
  Filled 2021-10-10: qty 2.5

## 2021-10-10 MED ORDER — HEPARIN SODIUM (PORCINE) 5000 UNIT/ML IJ SOLN
5000.0000 [IU] | Freq: Two times a day (BID) | INTRAMUSCULAR | Status: DC
  Administered 2021-10-10 – 2021-10-12 (×4): 5000 [IU] via SUBCUTANEOUS
  Filled 2021-10-10 (×4): qty 1

## 2021-10-10 MED ORDER — MONTELUKAST SODIUM 10 MG PO TABS
10.0000 mg | ORAL_TABLET | Freq: Every day | ORAL | Status: DC
  Administered 2021-10-11 – 2021-10-12 (×2): 10 mg via ORAL
  Filled 2021-10-10 (×2): qty 1

## 2021-10-10 MED ORDER — ACETAMINOPHEN 650 MG RE SUPP: 650.0000 mg | Freq: Four times a day (QID) | RECTAL | Status: DC | PRN

## 2021-10-10 MED ORDER — FUROSEMIDE 20 MG PO TABS
20.0000 mg | ORAL_TABLET | Freq: Every day | ORAL | Status: DC | PRN
  Administered 2021-10-10: 20 mg via ORAL
  Filled 2021-10-10: qty 1

## 2021-10-10 MED ORDER — HYDRALAZINE HCL 20 MG/ML IJ SOLN
10.0000 mg | Freq: Once | INTRAMUSCULAR | Status: AC
  Administered 2021-10-10: 10 mg via INTRAVENOUS
  Filled 2021-10-10: qty 1

## 2021-10-10 MED ORDER — VITAMIN B-12 1000 MCG PO TABS
1000.0000 ug | ORAL_TABLET | Freq: Every day | ORAL | Status: DC
  Administered 2021-10-11 – 2021-10-12 (×2): 1000 ug via ORAL
  Filled 2021-10-10 (×3): qty 1

## 2021-10-10 MED ORDER — ONDANSETRON HCL 4 MG/2ML IJ SOLN: 4.0000 mg | Freq: Four times a day (QID) | INTRAMUSCULAR | Status: DC | PRN

## 2021-10-10 MED ORDER — SODIUM ZIRCONIUM CYCLOSILICATE 5 G PO PACK
5.0000 g | PACK | Freq: Once | ORAL | Status: AC
  Administered 2021-10-10: 5 g via ORAL
  Filled 2021-10-10: qty 1

## 2021-10-10 NOTE — ED Triage Notes (Signed)
EMS reports from [facility] assisted Living. SOB since today, states out of Inhaler until Monday. Hx Asthma.   BP 194/85 HR 87 RR 24 Sp02 90 RA  10 albuterol  1 Atrovent enroute with relief.

## 2021-10-10 NOTE — ED Provider Notes (Signed)
[facility] DEPT Provider Note   CSN: [id number] Arrival date & time: 10/10/21  0845     History Chief Complaint  Patient presents with   Shortness of Breath    Lisa Cortez is a 87 y.o. female.  HPI She presents by EMS for evaluation of shortness of breath after running out of her albuterol inhaler.  Transport she was treated with albuterol, Atrovent, and oxygen.  On arrival oxygenation is 90% on 2 L nasal cannula. She reports shortness of breath for several days.  She denies fever, productive cough, nausea or vomiting.  She lives independently at a nursing care facility.  She denies other recent illnesses.  There are no other known active modifying factors.    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     Patient Active Problem List   Diagnosis Date Noted   AV block, 3rd degree ([place]) 11/25/2020   CHF (congestive heart failure) ([place]) 11/25/2020   NSTEMI (non-ST elevated myocardial infarction) ([place]) 07/09/2020   Chronic kidney disease (CKD), stage IV (severe) (HCC)    Hyperlipidemia    Hypothyroidism    Normocytic anemia    Transaminitis    Diastolic dysfunction    Acute respiratory disease due to COVID-19 virus    Sepsis ([place]) 08/20/2019   Pneumonia 08/20/2019   COPD with acute exacerbation ([place]) 08/20/2019   Acute on chronic respiratory failure with hypoxia ([place]) 08/20/2019   Chest pain 08/20/2019   Nausea and vomiting 08/20/2019   Macrocytic anemia 08/20/2019   Hypertension 08/20/2019   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     Family History  Problem Relation Age of Onset   CVA Mother    Bone cancer Brother    Dementia Brother     Social History   Tobacco Use   Smoking status: Former   Smokeless tobacco: Never  Scientific laboratory technician Use: Never used  Substance Use Topics   Alcohol use: Yes    Comment: 1/2 glass of wine night   Drug use: Never    Home Medications Prior to Admission medications   Medication Sig Start Date End Date Taking? Authorizing Provider  albuterol (VENTOLIN HFA) 108 (90 Base) MCG/ACT inhaler Inhale 2 puffs into the lungs every 4 (four) hours as needed. 05/25/19  Yes [provider]  amLODipine (NORVASC) 2.5 MG tablet Take 2.5 mg by mouth daily. 07/01/16  Yes [provider]  atorvastatin (LIPITOR) 10 MG tablet Take 10 mg by mouth daily. 06/30/16  Yes [provider]  carvedilol (COREG) 12.5 MG tablet Take 12.5 mg by mouth 2 (two) times daily. 08/27/21  Yes [provider]  carvedilol (COREG) 6.25 MG tablet Take 1 tablet (6.25 mg total) by mouth 2 (two)  times daily with a meal. 11/27/20  Yes Mikhail, [place], DO  Cyanocobalamin (B-12) 2000 MCG TABS Take 1,000 mcg by mouth daily.   Yes [provider]  docusate sodium (COLACE) 100 MG capsule Take 200 mg by mouth daily.   Yes [provider]  ferrous sulfate 325 (65 FE) MG tablet Take 325 mg by mouth daily with breakfast.   Yes [provider]  fluocinonide cream (LIDEX) 1.76 % Apply 1 application topically 2 (two) times daily.   Yes [provider]  Fluticasone-Salmeterol (ADVAIR) 250-50 MCG/DOSE AEPB Inhale 1 puff into the lungs 2 (two) times daily. 07/31/18  Yes [provider]  furosemide (LASIX) 20 MG tablet  Take 1 tablet (20 mg total) by mouth daily as needed for fluid or edema (weight gain >3lbs). 11/27/20 11/27/21 Yes Mikhail, Velta Addison, DO  isosorbide mononitrate (IMDUR) 30 MG 24 hr tablet Take 1 tablet (30 mg total) by mouth daily. 07/15/20 10/10/21 Yes Thurnell Lose, MD  levothyroxine (SYNTHROID) 75 MCG tablet Take 75 mcg by mouth daily. 08/14/19  Yes [provider]  melatonin 3 MG TABS tablet Take 3 mg by mouth at bedtime as needed for sleep. 02/20/16  Yes [provider]  memantine (NAMENDA) 5 MG tablet Take 5 mg by mouth 2 (two) times daily.  10/23/18  Yes [provider]  montelukast (SINGULAIR) 10 MG tablet Take 10 mg by mouth daily. 06/18/19  Yes [provider]  nitroGLYCERIN (NITROSTAT) 0.4 MG SL tablet Place 0.4 mg under the tongue every 5 (five) minutes as needed for chest pain.  11/04/20  Yes [provider]  pramipexole (MIRAPEX) 0.25 MG tablet Take 0.25 mg by mouth daily.  06/19/19  Yes [provider]  sodium bicarbonate 650 MG tablet Take 1,300 mg by mouth 2 (two) times daily.  06/19/19  Yes [provider]  traMADol (ULTRAM) 50 MG tablet Take 50 mg by mouth daily as needed for pain. 11/03/20  Yes [provider]  aspirin EC 81 MG tablet Take 1 tablet (81 mg total) by mouth daily. Patient not taking: Reported on 10/10/2021 07/19/20   Thurnell Lose, MD  famotidine (PEPCID) 20 MG tablet Take 10 mg by mouth daily. Patient not taking: Reported on 10/10/2021 07/27/21   [provider]  pantoprazole (PROTONIX) 40 MG tablet Take 1 tablet (40 mg total) by mouth 2 (two) times daily. Patient not taking: Reported on 10/10/2021 07/15/20   Thurnell Lose, MD    Allergies    Tenex [guanfacine hcl], Calcitonin, Epoetin alfa, Fosamax [alendronate sodium], Gatifloxacin, Procrit [epoetin (alfa)], Verapamil, and Atorvastatin  Review of Systems   Review of Systems  All other systems reviewed and are negative.  Physical  Exam Updated Vital Signs BP (!) 147/102   Pulse 70   Temp 97.7 F (36.5 C) (Oral)   Resp 20   SpO2 93%   Physical Exam Vitals and nursing note reviewed.  Constitutional:      General: She is not in acute distress.    Appearance: She is well-developed. She is not ill-appearing, toxic-appearing or diaphoretic.  HENT:     Head: Normocephalic and atraumatic.     Right Ear: External ear normal.     Left Ear: External ear normal.  Eyes:     Conjunctiva/sclera: Conjunctivae normal.     Pupils: Pupils are equal, round, and reactive to light.  Neck:     Trachea: Phonation normal.  Cardiovascular:     Rate and Rhythm: Normal  rate and regular rhythm.     Heart sounds: Normal heart sounds.  Pulmonary:     Effort: Pulmonary effort is normal.     Comments: Poor air movement bilaterally.  No audible wheezes rales or rhonchi.  There is no increased work of breathing. Abdominal:     Palpations: Abdomen is soft.     Tenderness: There is no abdominal tenderness.  Musculoskeletal:        General: Normal range of motion.     Cervical back: Normal range of motion and neck supple.     Right lower leg: No edema.     Left lower leg: No edema.  Skin:    General: Skin is warm and dry.  Neurological:     Mental Status: She is alert and oriented to person, place, and time.     Cranial Nerves: No cranial nerve deficit.     Sensory: No sensory deficit.     Motor: No abnormal muscle tone.     Coordination: Coordination normal.  Psychiatric:        Mood and Affect: Mood normal.        Behavior: Behavior normal.        Thought Content: Thought content normal.        Judgment: Judgment normal.    ED Results / Procedures / Treatments   Labs (all labs ordered are listed, but only abnormal results are displayed) Labs Reviewed  BASIC METABOLIC PANEL - Abnormal; Notable for the following components:      Result Value   Potassium 5.4 (*)    CO2 19 (*)    BUN 43 (*)    Creatinine, Ser 1.87 (*)     Calcium 8.1 (*)    GFR, Estimated 25 (*)    All other components within normal limits  CBC WITH DIFFERENTIAL/PLATELET - Abnormal; Notable for the following components:   RBC 3.32 (*)    Hemoglobin 10.6 (*)    HCT 33.6 (*)    MCV 101.2 (*)    Platelets 133 (*)    All other components within normal limits  BLOOD GAS, VENOUS - Abnormal; Notable for the following components:   pCO2, Ven 38.3 (*)    pO2, Ven 58.5 (*)    Acid-base deficit 2.5 (*)    All other components within normal limits  POTASSIUM - Abnormal; Notable for the following components:   Potassium 5.4 (*)    All other components within normal limits  RESP PANEL BY RT-PCR (FLU A&B, COVID) [id number]    EKG None  Radiology DG Chest Port 1 View  Result Date: 10/10/2021 CLINICAL DATA:  Shortness of breath. EXAM: PORTABLE CHEST 1 VIEW COMPARISON:  01/13/2021 FINDINGS: Left chest wall pacer device is identified with leads in the right atrial appendage and right ventricle. Heart size is normal. Aortic atherosclerotic calcifications. No pleural effusion or edema identified. No airspace disease. The visualized osseous structures are unremarkable. IMPRESSION: No acute cardiopulmonary abnormalities. Electronically Signed   By: Kerby Moors M.D.   On: 10/10/2021 10:00    Procedures .Critical Care Performed by: Daleen Bo, MD Authorized by: Daleen Bo, MD   Critical care provider statement:    Critical care time (minutes):  45   Critical care start time:  10/10/2021 9:00 AM   Critical care end time:  10/10/2021 2:59 PM   Critical care time was exclusive of:  Separately billable procedures and treating other patients   Critical care was necessary to treat or prevent imminent or  life-threatening deterioration of the following conditions:  Respiratory failure   Critical care was time spent personally by me on the following activities:  Blood draw for specimens, development of treatment plan with patient or surrogate,  discussions with consultants, evaluation of patient's response to treatment, examination of patient, ordering and performing treatments and interventions, ordering and review of laboratory studies, ordering and review of radiographic studies, pulse oximetry, re-evaluation of patient's condition and review of old charts   Medications Ordered in ED Medications  0.9 %  sodium chloride infusion ( Intravenous New Bag/Given 10/10/21 0953)  albuterol (PROVENTIL) (2.5 MG/3ML) 0.083% nebulizer solution 5 mg (5 mg Nebulization Given 10/10/21 1131)  ipratropium (ATROVENT) nebulizer solution 0.5 mg (0.5 mg Nebulization Given 10/10/21 1131)  predniSONE (DELTASONE) tablet 60 mg (60 mg Oral Given 10/10/21 0954)  magnesium sulfate IVPB 2 g 50 mL (0 g Intravenous Stopped 10/10/21 1046)  sodium zirconium cyclosilicate (LOKELMA) packet 5 g (5 g Oral Given 10/10/21 1337)    ED Course  I have reviewed the triage vital signs and the nursing notes.  Pertinent labs & imaging results that were available during my care of the patient were reviewed by me and considered in my medical decision making (see chart for details).    MDM Rules/Calculators/A&P                            Patient Vitals for the past 24 hrs:  BP Temp Temp src Pulse Resp SpO2  10/10/21 1400 (!) 147/102 -- -- 70 20 93 %  10/10/21 1330 (!) 150/117 -- -- 71 20 93 %  10/10/21 1230 (!) 171/98 -- -- 70 20 91 %  10/10/21 1131 -- -- -- -- -- 94 %  10/10/21 1130 (!) 166/74 -- -- 68 18 90 %  10/10/21 1115 (!) 166/74 -- -- 70 18 (!) 84 %  10/10/21 1030 (!) 168/78 -- -- 63 19 93 %  10/10/21 0939 (!) 186/75 -- -- 65 17 93 %  10/10/21 0853 (!) 188/80 97.7 F (36.5 C) Oral 66 20 90 %    2:45 PM Reevaluation with update and discussion. After initial assessment and treatment, an updated evaluation reveals she is alert and states she wants to go home.  Oxygen removed and her saturations dropped from 93% 89% after about 5 minutes.  This point she had  noticeable increased in respiratory rate.  I discussed the findings with her daughter Neoma Laming by telephone.  She understands that her mother needs to be hospitalized.  Patient states she will stay. Daleen Bo   Medical Decision Making:  This patient is presenting for evaluation of shortness of breath, which does require a range of treatment options, and is a complaint that involves a moderate risk of morbidity and mortality. The differential diagnoses include COPD exacerbation, pneumonia, viral infection. I decided to review old records, and in summary elderly patient, lives in a assisted living facility, has a history of COPD, acute on chronic respiratory failure, hypertension, NSTEMI, diastolic cardiac dysfunction and third-degree AV block.  She has a pacemaker since 2021.  I obtained additional historical information from daughter by telephone.  Clinical Laboratory Tests Ordered, included venous blood gas, CBC, c-Met, viral panel. Review indicates normal except potassium elevated, [place] given, creatinine high, calcium low, GFR low, hemoglobin low. Radiologic Tests Ordered, included portable chest x-ray.  I independently Visualized: Radiographic images, which show no infiltrate or edema  Cardiac Monitor Tracing which shows normal  sinus rhythm   Critical Interventions-clinical evaluation, laboratory testing, radiography, nebulizers, steroids, observation and reassessment  After These Interventions, the Patient was reevaluated and was found with significant COPD exacerbation associated with hypoxia.  No overt respiratory failure.  She will require hospitalization for stabilization and observed [place].  I suspect she will have to go home on oxygen.  Doubt sepsis or metabolic disorder.  CRITICAL CARE-yes Performed by: Daleen Bo  Nursing Notes Reviewed/ Care Coordinated Applicable Imaging Reviewed Interpretation of Laboratory Data incorporated into ED treatment  2:50 PM-Consult  complete with hospitalist. Patient case explained and discussed.  He agrees to admit patient for further evaluation and treatment. Call ended at 3:01 PM   Final Clinical Impression(s) / ED Diagnoses Final diagnoses:  COPD exacerbation ([place])  Hypoxia    Rx / DC Orders ED Discharge Orders     None        Daleen Bo, MD 10/10/21 [phone]

## 2021-10-10 NOTE — H&P (Signed)
History and Physical    Lisa Cortez YIF:[id number] DOB: 1925/09/21 DOA: 10/10/2021  PCP: Lajean Manes, MD  Patient coming from: SNF.  I have personally briefly reviewed patient's old medical records in [place]  Chief Complaint: Shortness of breath.  HPI: Lisa Cortez is a 87 y.o. 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  EMS gave a 10 mg albuterol +1 mg of ipratropium neb in route with relief.  She denied fever, chills, sore throat, rhinorrhea, productive cough or hemoptysis.  No chest pain, palpitations, diaphoresis, PND, orthopnea or pitting edema of the lower extremities.  Denied abdominal pain, nausea, emesis, diarrhea, constipation, melena or hematochezia.  No dysuria, flank pain, frequency or hematuria.  Denied blurry vision, polyuria, polydipsia dysuria or polyphagia.  No appetite changes.  She has been having satisfactory sleep.  ED Course: Initial vital signs were temperature 97.7 F, pulse 66, respiration 20, BP 188/80 mmHg and O2 sat 90 percent on room air.  She is currently is saturating high 90s on 4 LPM.  She received prednisone 60 mg p.o. x1, magnesium sulfate 2 g IVPB and another 5 mg of albuterol +0.5 mg of ipratropium via neb.  Lab work: Venous gas showed a pH of 7.374, PCO2 of 38.3 and PO2 of 58.5.  CBC with a white count of 4.8, hemoglobin 10.6 g/dL platelets 133.  BMP showed normal sodium, chloride and glucose.  Potassium was 5.4 and CO2 19 mmol/L.  BUN 43, creatinine 1.87 and calcium 8.1 mg/dL.  No low albumin level has been drawn.  Imaging: One-view portable chest  radiograph did not show any acute abnormalities.  Please see image and full radiology report for further details.  Review of Systems: As per HPI otherwise all other systems reviewed and are negative.  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 She has never used smokeless tobacco. She reports current alcohol use. She reports that she does not use drugs.  Allergies  Allergen Reactions   Tenex [Guanfacine Hcl] Other (See Comments)    unknown   Calcitonin Other (See Comments)    unknown   Epoetin Alfa Other (See Comments)   Fosamax [Alendronate Sodium] Other (See Comments)    unknown   Gatifloxacin Other (See Comments)    unknown   Procrit [Epoetin (Alfa)] Other (See Comments)    unknown   Verapamil Other (See Comments)    unknown  Atorvastatin Other (See Comments)    2011: caused elevated liver function tests   Family History  Problem Relation Age of Onset   CVA Mother    Bone cancer Brother    Dementia Brother    Prior to Admission medications   Medication Sig Start Date End Date Taking? Authorizing Provider  albuterol (VENTOLIN HFA) 108 (90 Base) MCG/ACT inhaler Inhale 2 puffs into the lungs every 4 (four) hours as needed. 05/25/19   Yes [provider]  amLODipine (NORVASC) 2.5 MG tablet Take 2.5 mg by mouth daily. 07/01/16  Yes [provider]  atorvastatin (LIPITOR) 10 MG tablet Take 10 mg by mouth daily. 06/30/16  Yes [provider]  carvedilol (COREG) 12.5 MG tablet Take 12.5 mg by mouth 2 (two) times daily. 08/27/21  Yes [provider]  carvedilol (COREG) 6.25 MG tablet Take 1 tablet (6.25 mg total) by mouth 2 (two) times daily with a meal. 11/27/20  Yes Mikhail, [place], DO  Cyanocobalamin (B-12) 2000 MCG TABS Take 1,000 mcg by mouth daily.   Yes [provider]  docusate sodium (COLACE) 100 MG capsule Take 200 mg by mouth daily.   Yes [provider]  ferrous sulfate 325 (65 FE) MG tablet Take 325 mg by mouth daily with breakfast.   Yes [provider]  fluocinonide cream (LIDEX) 8.52 % Apply 1 application topically 2 (two) times daily.   Yes [provider]  Fluticasone-Salmeterol (ADVAIR) 250-50 MCG/DOSE AEPB Inhale 1 puff into the lungs 2 (two) times daily. 07/31/18  Yes [provider]  furosemide (LASIX) 20 MG tablet Take 1 tablet (20 mg total) by mouth daily as needed for fluid or edema (weight gain >3lbs). 11/27/20 11/27/21 Yes Mikhail, Velta Addison, DO  isosorbide mononitrate (IMDUR) 30 MG 24 hr tablet Take 1 tablet (30 mg total) by mouth daily. 07/15/20 10/10/21 Yes Thurnell Lose, MD  levothyroxine (SYNTHROID) 75 MCG tablet Take 75 mcg by mouth daily. 08/14/19  Yes [provider]  melatonin 3 MG TABS tablet Take 3 mg by mouth at bedtime as needed for sleep. 02/20/16  Yes [provider]  memantine (NAMENDA) 5 MG tablet Take 5 mg by mouth 2 (two) times daily.  10/23/18  Yes [provider]  montelukast (SINGULAIR) 10 MG tablet Take 10 mg by mouth daily. 06/18/19  Yes [provider]  nitroGLYCERIN (NITROSTAT) 0.4 MG SL tablet Place 0.4 mg under the tongue every 5 (five) minutes as needed for chest pain.   11/04/20  Yes [provider]  pramipexole (MIRAPEX) 0.25 MG tablet Take 0.25 mg by mouth daily.  06/19/19  Yes [provider]  sodium bicarbonate 650 MG tablet Take 1,300 mg by mouth 2 (two) times daily.  06/19/19  Yes [provider]  traMADol (ULTRAM) 50 MG tablet Take 50 mg by mouth daily as needed for pain. 11/03/20  Yes [provider]  aspirin EC 81 MG tablet Take 1 tablet (81 mg total) by mouth daily. Patient not taking: Reported on 10/10/2021 07/19/20   Thurnell Lose, MD  famotidine (PEPCID) 20 MG tablet Take 10 mg by mouth daily. Patient not taking: Reported on 10/10/2021 07/27/21   [provider]  pantoprazole (PROTONIX) 40 MG tablet Take 1 tablet (40 mg total) by mouth 2 (two) times daily. Patient not taking: Reported on 10/10/2021 07/15/20   Thurnell Lose, MD   Physical Exam: Vitals:   10/10/21 1131 10/10/21 1230 10/10/21 1330 10/10/21 1400  BP:  Marland Kitchen)  171/98 (!) 150/117 (!) 147/102  Pulse:  70 71 70  Resp:  20 20 20   Temp:      TempSrc:      SpO2: 94% 91% 93% 93%   Constitutional: Chronically ill-appearing.  NAD, calm, comfortable Eyes: PERRL, lids and conjunctivae normal.  Mildly injected conjunctiva. ENMT: Mucous membranes are moist. Posterior pharynx clear of any exudate or lesions. Neck: normal, supple, no masses, no thyromegaly Respiratory: Decreased breath sounds, no wheezing or rhonchi, no crackles. Normal respiratory effort. No accessory muscle use.  Cardiovascular: Regular rate and rhythm, no murmurs / rubs / gallops. No extremity edema. 2+ pedal pulses. No carotid bruits.  Abdomen: No distention.  Bowel sounds positive.  Soft, no tenderness, no masses palpated. No hepatosplenomegaly. Musculoskeletal: no clubbing / cyanosis. Good ROM, no contractures. Normal muscle tone.  Skin: Some areas of ecchymosis on upper extremities from venipuncture on very limited dermatological examination. Neurologic: CN 2-12 grossly  intact. Sensation intact, DTR normal. Strength 5/5 in all 4.  Psychiatric: Normal judgment and insight. Alert and oriented x 3. Normal mood.   Labs on Admission: I have personally reviewed following labs and imaging studies  CBC: Recent Labs  Lab 10/10/21 0951  WBC 4.8  NEUTROABS 2.5  HGB 10.6*  HCT 33.6*  MCV 101.2*  PLT 133*    Basic Metabolic Panel: Recent Labs  Lab 10/10/21 0951 10/10/21 1117  NA 137  --   K 5.4* 5.4*  CL 111  --   CO2 19*  --   GLUCOSE 88  --   BUN 43*  --   CREATININE 1.87*  --   CALCIUM 8.1*  --     GFR: CrCl cannot be calculated (Unknown ideal weight.).  Liver Function Tests: No results for input(s): AST, ALT, ALKPHOS, BILITOT, PROT, ALBUMIN in the last 168 hours.  Urine analysis: No results found for: COLORURINE, APPEARANCEUR, LABSPEC, [place], GLUCOSEU, [place], BILIRUBINUR, KETONESUR, PROTEINUR, UROBILINOGEN, NITRITE, LEUKOCYTESUR  Radiological Exams on Admission: DG Chest Port 1 View  Result Date: 10/10/2021 CLINICAL DATA:  Shortness of breath. EXAM: PORTABLE CHEST 1 VIEW COMPARISON:  01/13/2021 FINDINGS: Left chest wall pacer device is identified with leads in the right atrial appendage and right ventricle. Heart size is normal. Aortic atherosclerotic calcifications. No pleural effusion or edema identified. No airspace disease. The visualized osseous structures are unremarkable. IMPRESSION: No acute cardiopulmonary abnormalities. Electronically Signed   By: Kerby Moors M.D.   On: 10/10/2021 10:00    EKG: Independently reviewed.   Assessment/Plan Principal Problem:   Acute on chronic respiratory failure with hypoxia (HCC) In the setting of   COPD with acute exacerbation (HCC) Observation/telemetry. Continue supplemental oxygen. DuoNebs 4 times daily. Albuterol neb as needed every 4 hours. Continue prednisone 40 mg p.o. daily.  Active Problems:   Macrocytic anemia Monitor hematocrit and hemoglobin. Transfuse as needed.     Hypertension Continue carvedilol 3.125 mg p.o. twice daily.. She is also on daily Imdur and as needed furosemide.    Chronic kidney disease (CKD), stage IV (severe) (HCC) Renal function is stable. Monitor GFR and electrolytes.    Hyperlipidemia Continue atorvastatin 10 mg p.o. daily.    Hypothyroidism Continue levothyroxine 75 mcg p.o. daily.    Diastolic dysfunction Compensated at this time. Continue furosemide and carvedilol.    CAD (coronary artery disease) Continue enteric-coated aspirin. Continue statin and beta-blocker. Continue isosorbide mononitrate.    Hyperkalemia Had Lokelma earlier. At least 15 mg of albuterol She is also on furosemide.   DVT  prophylaxis: Lovenox SQ. Code Status:   DNR. Family Communication:   Disposition Plan:   Patient is from:  SNF.  Anticipated DC to:  SNF.  Anticipated DC date:  10/11/2021 or 10/12/2021.  Anticipated DC barriers: Clinical status. Consults called:   Admission status:  Observation/telemetry.  Severity of Illness:  High severity after presenting with dyspnea/tachypnea, dry cough, wheezing and hypoxia in the setting of COPD exacerbation.  The patient will remain for 24 to 48 hours for further treatment.  Reubin Milan MD [facility]  How to contact the [facility] Attending or Consulting provider [place] or covering provider during after hours [place], for this patient?   Check the care team in [facility] and look for a) attending/consulting [facility] provider listed and b) the [facility] team listed Log into [email or web address] and use [facility]'s universal password to access. If you do not have the password, please contact the hospital operator. Locate the [facility] provider you are looking for under [facility] and page to a number that you can be directly reached. If you still have difficulty reaching the provider, please page the [facility] (Director on Call) for the Hospitalists listed on amion for assistance.  10/10/2021, 4:31 PM   This  document was prepared using Dragon voice recognition software may contain some unintended transcription errors.

## 2021-10-10 NOTE — ED Notes (Signed)
ED TO INPATIENT HANDOFF REPORT  Name/Age/Gender Lisa Cortez 87 y.o. female  Code Status    Code Status Orders  (From admission, onward)           Start     Ordered   10/10/21 1619  Do not attempt resuscitation (DNR)  Continuous       Question Answer Comment  In the event of cardiac or respiratory ARREST Do not call a "code blue"   In the event of cardiac or respiratory ARREST Do not perform Intubation, CPR, defibrillation or ACLS   In the event of cardiac or respiratory ARREST Use medication by any route, position, wound care, and other measures to relive pain and suffering. May use oxygen, suction and manual treatment of airway obstruction as needed for comfort.      10/10/21 1620           Code Status History     Date Active Date Inactive Code Status Order ID Comments User Context   11/25/2020 1358 11/27/2020 1953 DNR [id number]  Lequita Halt, MD ED   07/09/2020 1943 07/16/2020 0220 DNR [id number]  Reubin Milan, MD ED   07/09/2020 1943 07/09/2020 1943 DNR [id number]  Reubin Milan, MD ED   08/20/2019 [phone] 08/24/2019 2001 DNR [id number]  Norval Morton, MD ED   08/20/2019 [id number] 08/20/2019 0832 Full Code [id number]  Norval Morton, MD ED      Advance Directive Documentation    [place] Most Recent Value  Type of Advance Directive Out of facility DNR (pink MOST or yellow form)  Pre-existing out of facility DNR order (yellow form or pink MOST form) Yellow form placed in chart (order not valid for inpatient use)  "MOST" Form in Place? --       Home/SNF/Other Nursing Home  Chief Complaint COPD with acute exacerbation ([place]) [J44.1]  Level of Care/Admitting Diagnosis ED Disposition     ED Disposition  Admit   Condition  --   [place]: [place] [[id number]]  Level of Care: Telemetry [5]  Admit to tele based on following criteria: Monitor for Ischemic changes  May place patient in observation at [facility] or [facility] if equivalent level of care is available:: No  Covid Evaluation: Asymptomatic Screening Protocol (No Symptoms)  Diagnosis: COPD with acute exacerbation [facility]) [[id number]]  Admitting Physician: Reubin Milan [[id number]]  Attending Physician: Reubin Milan [[id number]]          Medical History Past Medical History:  Diagnosis Date   Anemia of renal disease    Aortic insufficiency    Aortic stenosis    CAD (coronary artery disease) 10/10/2021   Chronic kidney disease (CKD), stage IV (severe) (HCC)    COPD (chronic obstructive pulmonary disease) ([place])    Diastolic dysfunction    Hyperlipidemia    Hypertension    Hypothyroidism    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    NSTEMI (non-ST elevated myocardial infarction) ([place]) 07/09/2020    Allergies Allergies  Allergen Reactions   Tenex [Guanfacine Hcl] Other (See Comments)    unknown   Calcitonin Other (See Comments)    unknown   Epoetin Alfa Other (See Comments)   Fosamax [Alendronate Sodium] Other (See Comments)    unknown   Gatifloxacin Other (See Comments)    unknown   Procrit [Epoetin (Alfa)] Other (See Comments)    unknown   Verapamil Other (See Comments)    unknown   Atorvastatin  Other (See Comments)    2011: caused elevated liver function tests    IV Location/Drains/Wounds Patient Lines/Drains/Airways Status     Active Line/Drains/Airways     Name Placement date Placement time Site Days   Peripheral IV 10/10/21 20 G Posterior;Right Hand 10/10/21  0952  Hand  less than 1   External Urinary Catheter 08/20/19  1929  --  782            Labs/Imaging Results for orders placed or performed during the hospital encounter of 10/10/21 (from the past 48 hour(s))  Blood gas, venous     Status: Abnormal   Collection Time: 10/10/21  9:16 AM  Result Value Ref Range   pH, Ven 7.374 7.250 - 7.430   pCO2, Ven 38.3 (L) 44.0 - 60.0 mmHg   pO2, Ven 58.5 (H) 32.0 - 45.0 mmHg   Bicarbonate 21.8 20.0 -  28.0 mmol/L   Acid-base deficit 2.5 (H) 0.0 - 2.0 mmol/L   O2 Saturation 88.2 %   Patient temperature 98.6     Comment: Performed at [facility], [place] [address] [place], [place] [address]  Basic metabolic panel     Status: Abnormal   Collection Time: 10/10/21  9:51 AM  Result Value Ref Range   Sodium 137 135 - 145 mmol/L   Potassium 5.4 (H) 3.5 - 5.1 mmol/L   Chloride 111 98 - 111 mmol/L   CO2 19 (L) 22 - 32 mmol/L   Glucose, Bld 88 70 - 99 mg/dL    Comment: Glucose reference range applies only to samples taken after fasting for at least 8 hours.   BUN 43 (H) 8 - 23 mg/dL   Creatinine, Ser 1.87 (H) 0.44 - 1.00 mg/dL   Calcium 8.1 (L) 8.9 - 10.3 mg/dL   GFR, Estimated 25 (L) >60 mL/min    Comment: (NOTE) Calculated using the CKD-EPI Creatinine Equation (2021)    Anion gap 7 5 - 15    Comment: Performed at [facility], [place] [address], [place], [place] [address]  CBC with Differential     Status: Abnormal   Collection Time: 10/10/21  9:51 AM  Result Value Ref Range   WBC 4.8 4.0 - 10.5 K/uL   RBC 3.32 (L) 3.87 - 5.11 MIL/uL   Hemoglobin 10.6 (L) 12.0 - 15.0 g/dL   HCT 33.6 (L) 36.0 - 46.0 %   MCV 101.2 (H) 80.0 - 100.0 fL   MCH 31.9 26.0 - 34.0 pg   MCHC 31.5 30.0 - 36.0 g/dL   RDW 14.6 11.5 - 15.5 %   Platelets 133 (L) 150 - 400 K/uL   nRBC 0.0 0.0 - 0.2 %   Neutrophils Relative % 53 %   Neutro Abs 2.5 1.7 - 7.7 K/uL   Lymphocytes Relative 29 %   Lymphs Abs 1.4 0.7 - 4.0 K/uL   Monocytes Relative 18 %   Monocytes Absolute 0.9 0.1 - 1.0 K/uL   Eosinophils Relative 0 %   Eosinophils Absolute 0.0 0.0 - 0.5 K/uL   Basophils Relative 0 %   Basophils Absolute 0.0 0.0 - 0.1 K/uL   Immature Granulocytes 0 %   Abs Immature Granulocytes 0.01 0.00 - 0.07 K/uL    Comment: Performed at [facility], [place] [address] [place], [place] [address]  Resp Panel by RT-PCR (Flu A&B, Covid) Nasopharyngeal Swab     Status: None    Collection Time: 10/10/21  9:52 AM   Specimen: Nasopharyngeal  Swab; Nasopharyngeal(NP) swabs in vial transport medium  Result Value Ref Range   SARS Coronavirus 2 by RT PCR NEGATIVE NEGATIVE    Comment: (NOTE) SARS-CoV-2 target nucleic acids are NOT DETECTED.  The SARS-CoV-2 RNA is generally detectable in upper respiratory specimens during the acute phase of infection. The lowest concentration of SARS-CoV-2 viral copies this assay can detect is 138 copies/mL. A negative result does not preclude SARS-Cov-2 infection and should not be used as the sole basis for treatment or other patient management decisions. A negative result may occur with  improper specimen collection/handling, submission of specimen other than nasopharyngeal swab, presence of viral mutation(s) within the areas targeted by this assay, and inadequate number of viral copies(<138 copies/mL). A negative result must be combined with clinical observations, patient history, and epidemiological information. The expected result is Negative.  Fact Sheet for Patients:  EntrepreneurPulse.com.au  Fact Sheet for Healthcare Providers:  IncredibleEmployment.be  This test is no t yet approved or cleared by the [place] FDA and  has been authorized for detection and/or diagnosis of SARS-CoV-2 by FDA under an Emergency Use Authorization (EUA). This EUA will remain  in effect (meaning this test can be used) for the duration of the COVID-19 declaration under Section 564(b)(1) of the Act, 21 U.S.C.section 360bbb-3(b)(1), unless the authorization is terminated  or revoked sooner.       Influenza A by PCR NEGATIVE NEGATIVE   Influenza B by PCR NEGATIVE NEGATIVE    Comment: (NOTE) The Xpert Xpress SARS-CoV-2/FLU/RSV plus assay is intended as an aid in the diagnosis of influenza from Nasopharyngeal swab specimens and should not be used as a sole basis for treatment. Nasal washings  and aspirates are unacceptable for Xpert Xpress SARS-CoV-2/FLU/RSV testing.  Fact Sheet for Patients: EntrepreneurPulse.com.au  Fact Sheet for Healthcare Providers: IncredibleEmployment.be  This test is not yet approved or cleared by the [place] FDA and has been authorized for detection and/or diagnosis of SARS-CoV-2 by FDA under an Emergency Use Authorization (EUA). This EUA will remain in effect (meaning this test can be used) for the duration of the COVID-19 declaration under Section 564(b)(1) of the Act, 21 U.S.C. section 360bbb-3(b)(1), unless the authorization is terminated or revoked.  Performed at [facility], [place] [address], [place], [place] [address]   Potassium     Status: Abnormal   Collection Time: 10/10/21 11:17 AM  Result Value Ref Range   Potassium 5.4 (H) 3.5 - 5.1 mmol/L    Comment: Performed at [facility], [place] [address], [place], [place] [address]   DG Chest Port 1 View  Result Date: 10/10/2021 CLINICAL DATA:  Shortness of breath. EXAM: PORTABLE CHEST 1 VIEW COMPARISON:  01/13/2021 FINDINGS: Left chest wall pacer device is identified with leads in the right atrial appendage and right ventricle. Heart size is normal. Aortic atherosclerotic calcifications. No pleural effusion or edema identified. No airspace disease. The visualized osseous structures are unremarkable. IMPRESSION: No acute cardiopulmonary abnormalities. Electronically Signed   By: Kerby Moors M.D.   On: 10/10/2021 10:00    Pending Labs Unresulted Labs (From admission, onward)     Start     Ordered   10/11/21 2993  Basic metabolic panel  Tomorrow morning,   R        10/10/21 1620   10/11/21 0500  CBC  Tomorrow morning,   R        10/10/21 1620            Vitals/Pain  Today's Vitals   10/10/21 1330 10/10/21 1400 10/10/21 1700 10/10/21 1830  BP: (!) 150/117 (!) 147/102 (!) 155/110 (!) 199/94  Pulse: 71  70 78 71  Resp: 20 20 (!) 21 18  Temp:      TempSrc:      SpO2: 93% 93% 96% 91%  PainSc:        Isolation Precautions No active isolations  Medications Medications  0.9 %  sodium chloride infusion ( Intravenous New Bag/Given 10/10/21 0953)  predniSONE (DELTASONE) tablet 40 mg (has no administration in time range)  acetaminophen (TYLENOL) tablet 650 mg (has no administration in time range)    Or  acetaminophen (TYLENOL) suppository 650 mg (has no administration in time range)  ondansetron (ZOFRAN) tablet 4 mg (has no administration in time range)    Or  ondansetron (ZOFRAN) injection 4 mg (has no administration in time range)  heparin injection 5,000 Units (has no administration in time range)  albuterol (PROVENTIL) (2.5 MG/3ML) 0.083% nebulizer solution 5 mg (5 mg Nebulization Given 10/10/21 1131)  ipratropium (ATROVENT) nebulizer solution 0.5 mg (0.5 mg Nebulization Given 10/10/21 1131)  predniSONE (DELTASONE) tablet 60 mg (60 mg Oral Given 10/10/21 0954)  magnesium sulfate IVPB 2 g 50 mL (0 g Intravenous Stopped 10/10/21 1046)  sodium zirconium cyclosilicate (LOKELMA) packet 5 g (5 g Oral Given 10/10/21 1337)    Mobility walks with person assist

## 2021-10-10 NOTE — ED Notes (Signed)
Patient got a room in [place]. Upon rechecking VS. Pt BP is up to 200/90. Hospitalist informed and just ordered hydralazine tablet 25mg . Will send pt up when BP is much stable.

## 2021-10-11 DIAGNOSIS — J441 Chronic obstructive pulmonary disease with (acute) exacerbation: Secondary | ICD-10-CM | POA: Diagnosis present

## 2021-10-11 DIAGNOSIS — I252 Old myocardial infarction: Secondary | ICD-10-CM | POA: Diagnosis not present

## 2021-10-11 DIAGNOSIS — E875 Hyperkalemia: Secondary | ICD-10-CM

## 2021-10-11 DIAGNOSIS — Z20822 Contact with and (suspected) exposure to covid-19: Secondary | ICD-10-CM | POA: Diagnosis present

## 2021-10-11 DIAGNOSIS — J9601 Acute respiratory failure with hypoxia: Secondary | ICD-10-CM

## 2021-10-11 DIAGNOSIS — N184 Chronic kidney disease, stage 4 (severe): Secondary | ICD-10-CM | POA: Diagnosis present

## 2021-10-11 DIAGNOSIS — Z9049 Acquired absence of other specified parts of digestive tract: Secondary | ICD-10-CM | POA: Diagnosis not present

## 2021-10-11 DIAGNOSIS — Z66 Do not resuscitate: Secondary | ICD-10-CM | POA: Diagnosis present

## 2021-10-11 DIAGNOSIS — I2510 Atherosclerotic heart disease of native coronary artery without angina pectoris: Secondary | ICD-10-CM | POA: Diagnosis present

## 2021-10-11 DIAGNOSIS — Z87891 Personal history of nicotine dependence: Secondary | ICD-10-CM | POA: Diagnosis not present

## 2021-10-11 DIAGNOSIS — E039 Hypothyroidism, unspecified: Secondary | ICD-10-CM | POA: Diagnosis present

## 2021-10-11 DIAGNOSIS — J9621 Acute and chronic respiratory failure with hypoxia: Secondary | ICD-10-CM | POA: Diagnosis not present

## 2021-10-11 DIAGNOSIS — Z823 Family history of stroke: Secondary | ICD-10-CM | POA: Diagnosis not present

## 2021-10-11 DIAGNOSIS — I129 Hypertensive chronic kidney disease with stage 1 through stage 4 chronic kidney disease, or unspecified chronic kidney disease: Secondary | ICD-10-CM | POA: Diagnosis present

## 2021-10-11 DIAGNOSIS — G939 Disorder of brain, unspecified: Secondary | ICD-10-CM | POA: Diagnosis present

## 2021-10-11 DIAGNOSIS — Z79899 Other long term (current) drug therapy: Secondary | ICD-10-CM | POA: Diagnosis not present

## 2021-10-11 DIAGNOSIS — Z950 Presence of cardiac pacemaker: Secondary | ICD-10-CM | POA: Diagnosis not present

## 2021-10-11 DIAGNOSIS — R413 Other amnesia: Secondary | ICD-10-CM | POA: Diagnosis present

## 2021-10-11 DIAGNOSIS — D649 Anemia, unspecified: Secondary | ICD-10-CM | POA: Diagnosis present

## 2021-10-11 DIAGNOSIS — D631 Anemia in chronic kidney disease: Secondary | ICD-10-CM | POA: Diagnosis present

## 2021-10-11 DIAGNOSIS — Z809 Family history of malignant neoplasm, unspecified: Secondary | ICD-10-CM | POA: Diagnosis not present

## 2021-10-11 DIAGNOSIS — E785 Hyperlipidemia, unspecified: Secondary | ICD-10-CM | POA: Diagnosis present

## 2021-10-11 DIAGNOSIS — D696 Thrombocytopenia, unspecified: Secondary | ICD-10-CM | POA: Diagnosis present

## 2021-10-11 DIAGNOSIS — I352 Nonrheumatic aortic (valve) stenosis with insufficiency: Secondary | ICD-10-CM | POA: Diagnosis present

## 2021-10-11 DIAGNOSIS — D539 Nutritional anemia, unspecified: Secondary | ICD-10-CM | POA: Diagnosis present

## 2021-10-11 DIAGNOSIS — R0602 Shortness of breath: Secondary | ICD-10-CM | POA: Diagnosis present

## 2021-10-11 LAB — BASIC METABOLIC PANEL
Anion gap: 8 (ref 5–15)
BUN: 45 mg/dL — ABNORMAL HIGH (ref 8–23)
CO2: 20 mmol/L — ABNORMAL LOW (ref 22–32)
Calcium: 8.5 mg/dL — ABNORMAL LOW (ref 8.9–10.3)
Chloride: 107 mmol/L (ref 98–111)
Creatinine, Ser: 1.94 mg/dL — ABNORMAL HIGH (ref 0.44–1.00)
GFR, Estimated: 24 mL/min — ABNORMAL LOW (ref >60)
Glucose, Bld: 98 mg/dL (ref 70–99)
Potassium: 5.2 mmol/L — ABNORMAL HIGH (ref 3.5–5.1)
Sodium: 135 mmol/L (ref 135–145)

## 2021-10-11 LAB — CBC
HCT: 35.3 % — ABNORMAL LOW (ref 36.0–46.0)
Hemoglobin: 11.4 g/dL — ABNORMAL LOW (ref 12.0–15.0)
MCH: 32.7 pg (ref 26.0–34.0)
MCHC: 32.3 g/dL (ref 30.0–36.0)
MCV: 101.1 fL — ABNORMAL HIGH (ref 80.0–100.0)
Platelets: 147 10*3/uL — ABNORMAL LOW (ref 150–400)
RBC: 3.49 MIL/uL — ABNORMAL LOW (ref 3.87–5.11)
RDW: 14.6 % (ref 11.5–15.5)
WBC: 4.9 10*3/uL (ref 4.0–10.5)
nRBC: 0 % (ref 0.0–0.2)

## 2021-10-11 MED ORDER — SODIUM ZIRCONIUM CYCLOSILICATE 10 G PO PACK
10.0000 g | PACK | Freq: Once | ORAL | Status: AC
  Administered 2021-10-11: 10 g via ORAL
  Filled 2021-10-11: qty 1

## 2021-10-11 MED ORDER — HYDRALAZINE HCL 25 MG PO TABS: 25.0000 mg | ORAL_TABLET | Freq: Four times a day (QID) | ORAL | Status: DC | PRN

## 2021-10-11 MED ORDER — METHOCARBAMOL 500 MG PO TABS
500.0000 mg | ORAL_TABLET | Freq: Once | ORAL | Status: AC
  Administered 2021-10-11: 500 mg via ORAL
  Filled 2021-10-11: qty 1

## 2021-10-11 NOTE — Progress Notes (Signed)
PROGRESS NOTE   Lisa Cortez  AJG:[id number]    DOB: 27-Feb-1925    DOA: 10/10/2021  PCP: Lajean Manes, MD   I have briefly reviewed patients previous medical records in [facility].  Chief Complaint  Patient presents with   Shortness of Breath    Brief Narrative:  87 year old female, lives at her apartment at [place], independent, medical history significant for COPD not on home oxygen, HTN, HLD, hypothyroidism, CAD, stage IV CKD, diastolic dysfunction, anemia of CKD, AI/AS, permanent pacemaker, presented to the ED with progressively worsening dyspnea and wheezing, only partially responsive to albuterol MDI of which she ran out.  EMS nebulizer on route to ED.  In the ED saturating in high 90s on 4 L/min oxygen, BP 188/80.  Admitted for acute respiratory failure with hypoxia due to COPD exacerbation.  Slowly improving.   Assessment & Plan:  Principal Problem:   COPD with acute exacerbation ([place]) Active Problems:   Acute on chronic respiratory failure with hypoxia (HCC)   Macrocytic anemia   Hypertension   Chronic kidney disease (CKD), stage IV (severe) (HCC)   Hyperlipidemia   Hypothyroidism   Diastolic dysfunction   CAD (coronary artery disease)   Hyperkalemia   Acute respiratory failure with hypoxia due to COPD exacerbation - ABG 10/22: pH 7.374, PCO2 38.3, PO2 58.5 and oxygen saturation 88.2%. - Chest x-ray 10/22: No acute abnormalities. - Treated with oxygen support, prednisone 60 Mg x1 followed by 40 Mg daily, DuoNebs. - Added flutter valve. - Although improved, at risk for recurrent deterioration.  Continue current management and close monitoring.  Wean oxygen as tolerated for oxygen saturation greater than 90%. - Need to assess for home oxygen needs prior to discharge.  Hyperkalemia, mild - Persist despite Lokelma 5 g x1 dose on 10/22.  Potassium decreased from 5.4-5.2. - Lokelma 10 g x 1 dose and follow BMP in AM.  Low potassium diet.  Stage IV chronic  kidney disease: - Creatinine at baseline.  Outpatient follow-up  Anemia in chronic kidney disease - Stable.  Mild thrombocytopenia - Unclear etiology but improved.  Essential hypertension - Controlled on amlodipine, Imdur and carvedilol, continue.  Hyperlipidemia - Continue atorvastatin  Hypothyroidism - Continue levothyroxine  Diastolic dysfunction - No features suggestive of CHF at this time. - Continue prior home dose of carvedilol and furosemide  CAD/s/p PPM. - Continue aspirin, statins, beta-blockers and Imdur.   Body mass index is 23.85 kg/m.    DVT prophylaxis: heparin injection 5,000 Units Start: 10/10/21 2200     Code Status: DNR Family Communication: None at bedside. Disposition:  Status is: Observation  The patient will require care spanning > 2 midnights and should be moved to inpatient because: Still with wheezing, dyspnea and breathing not at baseline, hyperkalemia, at risk for decline and hence needs close monitoring and management.       Consultants:   None  Procedures:   None  Antimicrobials:    Anti-infectives (From admission, onward)    None         Subjective:  Feels better.  Breathing improved but not back to baseline.  Mild dry cough.  No chest pain.  Objective:   Vitals:   10/11/21 0741 10/11/21 0900 10/11/21 0901 10/11/21 1100  BP:   (!) 138/45   Pulse: 71  66   Resp: 18  17   Temp:   97.6 F (36.4 C)   TempSrc:   Oral   SpO2: 97% 97%  96%  Weight:  Height:        General exam: Very elderly female, moderately built and frail sitting up comfortably in bed.  May be gets somewhat short of breath with activity in bed. Respiratory system: Clear to auscultation anteriorly.  Few rhonchi left upper back.  Otherwise clear to auscultation.  Appears to get slightly dyspneic with minimal activity. Cardiovascular system: S1 & S2 heard, RRR. No JVD, murmurs, rubs, gallops or clicks. No pedal edema.  Telemetry personally  reviewed: Sinus rhythm.  Occasional on demand pacer spikes. Gastrointestinal system: Abdomen is nondistended, soft and nontender. No organomegaly or masses felt. Normal bowel sounds heard. Central nervous system: Alert and oriented. No focal neurological deficits. Extremities: Symmetric 5 x 5 power. Skin: No rashes, lesions or ulcers Psychiatry: Judgement and insight appear normal. Mood & affect appropriate.     Data Reviewed:   I have personally reviewed following labs and imaging studies   CBC: Recent Labs  Lab 10/10/21 0951 10/11/21 0523  WBC 4.8 4.9  NEUTROABS 2.5  --   HGB 10.6* 11.4*  HCT 33.6* 35.3*  MCV 101.2* 101.1*  PLT 133* 147*    Basic Metabolic Panel: Recent Labs  Lab 10/10/21 0951 10/10/21 1117 10/11/21 0523  NA 137  --  135  K 5.4* 5.4* 5.2*  CL 111  --  107  CO2 19*  --  20*  GLUCOSE 88  --  98  BUN 43*  --  45*  CREATININE 1.87*  --  1.94*  CALCIUM 8.1*  --  8.5*    Liver Function Tests: No results for input(s): AST, ALT, ALKPHOS, BILITOT, PROT, ALBUMIN in the last 168 hours.  CBG: No results for input(s): GLUCAP in the last 168 hours.  Microbiology Studies:   Recent Results (from the past 240 hour(s))  Resp Panel by RT-PCR (Flu A&B, Covid) Nasopharyngeal Swab     Status: None   Collection Time: 10/10/21  9:52 AM   Specimen: Nasopharyngeal Swab; Nasopharyngeal(NP) swabs in vial transport medium  Result Value Ref Range Status   SARS Coronavirus 2 by RT PCR NEGATIVE NEGATIVE Final    Comment: (NOTE) SARS-CoV-2 target nucleic acids are NOT DETECTED.  The SARS-CoV-2 RNA is generally detectable in upper respiratory specimens during the acute phase of infection. The lowest concentration of SARS-CoV-2 viral copies this assay can detect is 138 copies/mL. A negative result does not preclude SARS-Cov-2 infection and should not be used as the sole basis for treatment or other patient management decisions. A negative result may occur with   improper specimen collection/handling, submission of specimen other than nasopharyngeal swab, presence of viral mutation(s) within the areas targeted by this assay, and inadequate number of viral copies(<138 copies/mL). A negative result must be combined with clinical observations, patient history, and epidemiological information. The expected result is Negative.  Fact Sheet for Patients:  EntrepreneurPulse.com.au  Fact Sheet for Healthcare Providers:  IncredibleEmployment.be  This test is no t yet approved or cleared by the [place] FDA and  has been authorized for detection and/or diagnosis of SARS-CoV-2 by FDA under an Emergency Use Authorization (EUA). This EUA will remain  in effect (meaning this test can be used) for the duration of the COVID-19 declaration under Section 564(b)(1) of the Act, 21 U.S.C.section 360bbb-3(b)(1), unless the authorization is terminated  or revoked sooner.       Influenza A by PCR NEGATIVE NEGATIVE Final   Influenza B by PCR NEGATIVE NEGATIVE Final    Comment: (NOTE) The Xpert Xpress SARS-CoV-2/FLU/RSV  plus assay is intended as an aid in the diagnosis of influenza from Nasopharyngeal swab specimens and should not be used as a sole basis for treatment. Nasal washings and aspirates are unacceptable for Xpert Xpress SARS-CoV-2/FLU/RSV testing.  Fact Sheet for Patients: EntrepreneurPulse.com.au  Fact Sheet for Healthcare Providers: IncredibleEmployment.be  This test is not yet approved or cleared by the [place] FDA and has been authorized for detection and/or diagnosis of SARS-CoV-2 by FDA under an Emergency Use Authorization (EUA). This EUA will remain in effect (meaning this test can be used) for the duration of the COVID-19 declaration under Section 564(b)(1) of the Act, 21 U.S.C. section 360bbb-3(b)(1), unless the authorization is terminated  or revoked.  Performed at [facility], [place] [address], [place], [place] [address]      Radiology Studies:  [facility] Chest Port 1 View  Result Date: 10/10/2021 CLINICAL DATA:  Shortness of breath. EXAM: PORTABLE CHEST 1 VIEW COMPARISON:  01/13/2021 FINDINGS: Left chest wall pacer device is identified with leads in the right atrial appendage and right ventricle. Heart size is normal. Aortic atherosclerotic calcifications. No pleural effusion or edema identified. No airspace disease. The visualized osseous structures are unremarkable. IMPRESSION: No acute cardiopulmonary abnormalities. Electronically Signed   By: Kerby Moors M.D.   On: 10/10/2021 10:00     Scheduled Meds:    amLODipine  2.5 mg Oral Daily   atorvastatin  10 mg Oral Daily   carvedilol  12.5 mg Oral BID WC   docusate sodium  200 mg Oral Daily   ferrous sulfate  325 mg Oral Q breakfast   fluocinonide cream  1 application Topical BID   heparin injection (subcutaneous)  5,000 Units Subcutaneous Q12H   ipratropium-albuterol  3 mL Nebulization QID   isosorbide mononitrate  30 mg Oral Daily   levothyroxine  75 mcg Oral Daily   memantine  5 mg Oral BID   montelukast  10 mg Oral Daily   pramipexole  0.25 mg Oral Daily   predniSONE  40 mg Oral Q breakfast   sodium bicarbonate  1,300 mg Oral BID   vitamin B-12  1,000 mcg Oral Daily    Continuous Infusions:     LOS: 0 days     Vernell Leep, MD, [place], [facility]. [facility]    To contact the attending provider between 7A-7P or the covering provider during after hours 7P-7A, please log into the web site [email or web address] and access using universal [facility] password for that web site. If you do not have the password, please call the hospital operator.  10/11/2021, 1:34 PM

## 2021-10-11 NOTE — Progress Notes (Signed)
FYI--Daughter of pt visiting with her mother. Daughter stating she is worried hospital might discharge her mother before she is stable to go home without readmission.

## 2021-10-12 DIAGNOSIS — J441 Chronic obstructive pulmonary disease with (acute) exacerbation: Secondary | ICD-10-CM | POA: Diagnosis not present

## 2021-10-12 DIAGNOSIS — J9621 Acute and chronic respiratory failure with hypoxia: Secondary | ICD-10-CM | POA: Diagnosis not present

## 2021-10-12 LAB — BASIC METABOLIC PANEL
Anion gap: 6 (ref 5–15)
BUN: 53 mg/dL — ABNORMAL HIGH (ref 8–23)
CO2: 22 mmol/L (ref 22–32)
Calcium: 8.3 mg/dL — ABNORMAL LOW (ref 8.9–10.3)
Chloride: 105 mmol/L (ref 98–111)
Creatinine, Ser: 1.87 mg/dL — ABNORMAL HIGH (ref 0.44–1.00)
GFR, Estimated: 25 mL/min — ABNORMAL LOW (ref >60)
Glucose, Bld: 80 mg/dL (ref 70–99)
Potassium: 4.6 mmol/L (ref 3.5–5.1)
Sodium: 133 mmol/L — ABNORMAL LOW (ref 135–145)

## 2021-10-12 LAB — CBC
HCT: 35.6 % — ABNORMAL LOW (ref 36.0–46.0)
Hemoglobin: 11.4 g/dL — ABNORMAL LOW (ref 12.0–15.0)
MCH: 32.3 pg (ref 26.0–34.0)
MCHC: 32 g/dL (ref 30.0–36.0)
MCV: 100.8 fL — ABNORMAL HIGH (ref 80.0–100.0)
Platelets: 149 10*3/uL — ABNORMAL LOW (ref 150–400)
RBC: 3.53 MIL/uL — ABNORMAL LOW (ref 3.87–5.11)
RDW: 14.6 % (ref 11.5–15.5)
WBC: 7.7 10*3/uL (ref 4.0–10.5)
nRBC: 0 % (ref 0.0–0.2)

## 2021-10-12 MED ORDER — IPRATROPIUM-ALBUTEROL 0.5-2.5 (3) MG/3ML IN SOLN
3.0000 mL | Freq: Three times a day (TID) | RESPIRATORY_TRACT | Status: DC
  Administered 2021-10-12: 3 mL via RESPIRATORY_TRACT
  Filled 2021-10-12: qty 3

## 2021-10-12 MED ORDER — PREDNISONE 20 MG PO TABS: 40.0000 mg | ORAL_TABLET | Freq: Every day | ORAL | 0 refills | Status: DC

## 2021-10-12 MED ORDER — PREDNISONE 20 MG PO TABS: 40.0000 mg | ORAL_TABLET | Freq: Every day | ORAL | 0 refills | Status: AC

## 2021-10-12 NOTE — Progress Notes (Signed)
SATURATION QUALIFICATIONS: (This note is used to comply with regulatory documentation for home oxygen)  Patient Saturations on Room Air at Rest = 94%  Patient Saturations on Room Air while Ambulating = 90%    

## 2021-10-12 NOTE — Discharge Instructions (Signed)

## 2021-10-12 NOTE — Evaluation (Signed)
Physical Therapy Evaluation-1x Patient Details Name: Lisa Cortez MRN: [id number] DOB: [birth year] Today's Date: 10/12/2021  History of Present Illness  87 yo female admitted with COPD exac. Hx of COPD-no O2, NSTEMI, CKD, CH, AV block, bradycardia  Clinical Impression  On eval, pt was Supv-Mod Ind with mobility. She walked ~125 feet x 2 with a rollator. Pt tolerated activity well. She ambulated earlier with nursing and they performed pulm O2 sat assessment-see below for details during my session. Discussed d/c plan-pt will return to her Ind Living apt at [facility]. She agrees that she does not have any further PT needs. Will sign off.        Recommendations for follow up therapy are one component of a multi-disciplinary discharge planning process, led by the attending physician.  Recommendations may be updated based on patient status, additional functional criteria and insurance authorization.  Follow Up Recommendations No PT follow up    Assistance Recommended at Discharge Intermittent Supervision/Assistance  Functional Status Assessment Patient has had a recent decline in their functional status and demonstrates the ability to make significant improvements in function in a reasonable and predictable amount of time.  Equipment Recommendations  None recommended by PT    Recommendations for Other Services       Precautions / Restrictions Precautions Precautions: Fall Precaution Comments: monitor O2 Restrictions Weight Bearing Restrictions: No      Mobility  Bed Mobility Overal bed mobility: Modified Independent                  Transfers Overall transfer level: Modified independent                      Ambulation/Gait Ambulation/Gait assistance: Supervision Gait Distance (Feet): 125 Feet Assistive device: Rollator (4 wheels) Gait Pattern/deviations: Step-through pattern;Decreased stride length     General Gait Details: Supv for safety. O2 was 85% on RA  during 1st walk-made Rn aware-thinks it's poor signal 2* poor perfusion. Walked a 2nd time, O2 93% on RA (on finger), 95% on RA (on ear) dyspnea 2/4, some mild wheezing-pt reports hx of asthma  Stairs            Wheelchair Mobility    Modified Rankin (Stroke Patients Only)       Balance Overall balance assessment: Mild deficits observed, not formally tested         Standing balance support: Bilateral upper extremity supported;During functional activity Standing balance-Leahy Scale: Fair                               Pertinent Vitals/Pain Pain Assessment: Faces Faces Pain Scale: Hurts little more Pain Location: bil feet Pain Descriptors / Indicators: Discomfort;Sore Pain Intervention(s): Monitored during session    Home Living Family/patient expects to be discharged to:: Private residence Living Arrangements: Alone Available Help at Discharge: Family;Available PRN/intermittently Type of Home: Independent living facility [facility]) Home Access: Elevator       Home Layout: One level Home Equipment: Rollator (4 wheels)      Prior Function Prior Level of Function : Needs assist             Mobility Comments: uses rollator. no device inside apt. ADLs Comments: goes to dining room for meals or they bring them to her     Hand Dominance        Extremity/Trunk Assessment   Upper Extremity Assessment Upper Extremity Assessment: Generalized weakness  Lower Extremity Assessment Lower Extremity Assessment: Generalized weakness    Cervical / Trunk Assessment Cervical / Trunk Assessment: Normal  Communication   Communication: HOH  Cognition Arousal/Alertness: Awake/alert Behavior During Therapy: WFL for tasks assessed/performed Overall Cognitive Status: Within Functional Limits for tasks assessed                                          General Comments      Exercises     Assessment/Plan    PT Assessment  Patient does not need any further PT services  PT Problem List (P) Decreased strength;Decreased mobility;Decreased activity tolerance;Decreased balance;Decreased knowledge of use of DME       PT Treatment Interventions (P) DME instruction;Therapeutic exercise;Therapeutic activities;Gait training;Functional mobility training;Balance training;Patient/family education    PT Goals (Current goals can be found in the Care Plan section)  Acute Rehab PT Goals Patient Stated Goal: home today PT Goal Formulation: All assessment and education complete, DC therapy    Frequency (P) Min 3X/week   Barriers to discharge        Co-evaluation               AM-PAC PT "6 Clicks" Mobility  Outcome Measure Help needed turning from your back to your side while in a flat bed without using bedrails?: (P) None Help needed moving from lying on your back to sitting on the side of a flat bed without using bedrails?: (P) None Help needed moving to and from a bed to a chair (including a wheelchair)?: (P) None Help needed standing up from a chair using your arms (e.g., wheelchair or bedside chair)?: (P) None Help needed to walk in hospital room?: (P) A Little Help needed climbing 3-5 steps with a railing? : (P) A Little 6 Click Score: (P) 22    End of Session Equipment Utilized During Treatment: Gait belt Activity Tolerance: Patient tolerated treatment well Patient left: in chair;with call bell/phone within reach   PT Visit Diagnosis: (P) Difficulty in walking, not elsewhere classified (R26.2)    Time: 6270-3500 PT Time Calculation (min) (ACUTE ONLY): 27 min   Charges:   PT Evaluation $PT Eval Low Complexity: 1 Low PT Treatments $Gait Training: 8-22 mins          Doreatha Massed, PT Acute Rehabilitation  Office: [phone] Pager: [phone]

## 2021-10-12 NOTE — Discharge Summary (Addendum)
Physician Discharge Summary  Kendi Defalco UYQ:[id number] DOB: 09/12/1925  PCP: Lajean Manes, MD  Admitted from: Home Discharged to: Home pending PT evaluation.  Admit date: 10/10/2021 Discharge date: 10/12/2021  Recommendations for Outpatient Follow-up:    Follow-up Information     Lajean Manes, MD. Schedule an appointment as soon as possible for a visit.   Specialty: Internal Medicine Why: To be seen in 5 to 7 days with repeat labs (CBC & BMP). Contact information: [address] [place] [address] [phone]         Constance Haw, MD .   Specialty: Cardiology Contact information: [place] [address] [place]: To be determined pending PT evaluation    Equipment/Devices: To be determined pending PT evaluation    Discharge Condition: Improved and stable.   Code Status: DNR Diet recommendation:  Discharge Diet Orders (From admission, onward)     Start     Ordered   10/12/21 0000  Diet - low sodium heart healthy       Comments: Low potassium diet.  Patient and daughter have been counseled regarding same.   10/12/21 1417             Discharge Diagnoses:  Principal Problem:   COPD with acute exacerbation ([place]) Active Problems:   Acute on chronic respiratory failure with hypoxia (HCC)   Macrocytic anemia   Hypertension   Chronic kidney disease (CKD), stage IV (severe) (HCC)   Hyperlipidemia   Hypothyroidism   Diastolic dysfunction   CAD (coronary artery disease)   Hyperkalemia   COPD exacerbation (HCC)   Brief Summary: 87 year old female, lives at her apartment at [place], independent, medical history significant for COPD not on home oxygen, HTN, HLD, hypothyroidism, CAD, stage IV CKD, diastolic dysfunction, anemia of CKD, AI/AS, permanent pacemaker, presented to the ED with progressively worsening dyspnea and wheezing, only partially responsive to  albuterol MDI of which she ran out.  EMS nebulizer on route to ED.  In the ED saturating in high 90s on 4 L/min oxygen, BP 188/80.  Admitted for acute respiratory failure with hypoxia due to COPD exacerbation.      Assessment & Plan:     Acute respiratory failure with hypoxia due to COPD exacerbation - ABG 10/22: pH 7.374, PCO2 38.3, PO2 58.5 and oxygen saturation 88.2%. - Chest x-ray 10/22: No acute abnormalities. - Treated with oxygen support, prednisone 60 Mg x1 followed by 40 Mg daily, DuoNebs. - Added flutter valve. - Clinically improved.  Breathing back to her baseline.  Hypoxia resolved.  Saturating at 94% on room air at rest and 90% on room air while ambulating and does not qualify for home oxygen.  Hyperkalemia, mild - Persist despite Lokelma 5 g x1 dose on 10/22.  Potassium decreased from 5.4-5.2. - Lokelma 10 g x 1 dose and follow BMP in AM.  Low potassium diet. - Potassium down to 4.6. - Discussed extensively with patient and daughter regarding importance of low potassium diet.  RN will try and give patient some printed instructions for same.  They were advised to follow-up with their PCP in a few days with repeat labs.  Patient also follows with a nephrologist and [place].  Stage IV chronic kidney disease: - Creatinine at baseline.  Outpatient follow-up with PCP/nephrology.  Anemia in chronic kidney disease - Stable.  Mild  thrombocytopenia - Unclear etiology but improved and stable.  Follow CBC as outpatient  Essential hypertension - Controlled on amlodipine, Imdur and carvedilol, continue.  Hyperlipidemia - Continue atorvastatin  Hypothyroidism - Continue levothyroxine  Diastolic dysfunction - No features suggestive of CHF at this time. - Continue prior home dose of carvedilol and furosemide  CAD/s/p PPM. - Continue statins, beta-blockers and Imdur.  Patient reports that she has not been taking aspirin for a long time.  Will defer this to  outpatient follow-up with PCP or cardiology regarding resuming.     Body mass index is 23.85 kg/m.         Consultants:   None   Procedures:   None   Discharge Instructions  Discharge Instructions     Call MD for:  difficulty breathing, headache or visual disturbances   Complete by: As directed    Call MD for:  extreme fatigue   Complete by: As directed    Call MD for:  persistant dizziness or light-headedness   Complete by: As directed    Diet - low sodium heart healthy   Complete by: As directed    Low potassium diet.  Patient and daughter have been counseled regarding same.   Increase activity slowly   Complete by: As directed         Medication List     STOP taking these medications    aspirin EC 81 MG tablet   famotidine 20 MG tablet Commonly known as: PEPCID   pantoprazole 40 MG tablet Commonly known as: Protonix       TAKE these medications    albuterol 108 (90 Base) MCG/ACT inhaler Commonly known as: VENTOLIN HFA Inhale 2 puffs into the lungs every 4 (four) hours as needed.   amLODipine 2.5 MG tablet Commonly known as: NORVASC Take 2.5 mg by mouth daily.   atorvastatin 10 MG tablet Commonly known as: LIPITOR Take 10 mg by mouth daily.   B-12 2000 MCG Tabs Take 1,000 mcg by mouth daily.   carvedilol 12.5 MG tablet Commonly known as: COREG Take 12.5 mg by mouth 2 (two) times daily. What changed: Another medication with the same name was removed. Continue taking this medication, and follow the directions you see here.   docusate sodium 100 MG capsule Commonly known as: COLACE Take 200 mg by mouth daily.   ferrous sulfate 325 (65 FE) MG tablet Take 325 mg by mouth daily with breakfast.   fluocinonide cream 0.05 % Commonly known as: LIDEX Apply 1 application topically 2 (two) times daily.   Fluticasone-Salmeterol 250-50 MCG/DOSE Aepb Commonly known as: ADVAIR Inhale 1 puff into the lungs 2 (two) times daily.   furosemide 20 MG  tablet Commonly known as: Lasix Take 1 tablet (20 mg total) by mouth daily as needed for fluid or edema (weight gain >3lbs).   isosorbide mononitrate 30 MG 24 hr tablet Commonly known as: IMDUR Take 1 tablet (30 mg total) by mouth daily.   levothyroxine 75 MCG tablet Commonly known as: SYNTHROID Take 75 mcg by mouth daily.   melatonin 3 MG Tabs tablet Take 3 mg by mouth at bedtime as needed for sleep.   memantine 5 MG tablet Commonly known as: NAMENDA Take 5 mg by mouth 2 (two) times daily.   montelukast 10 MG tablet Commonly known as: SINGULAIR Take 10 mg by mouth daily.   nitroGLYCERIN 0.4 MG SL tablet Commonly known as: NITROSTAT Place 0.4 mg under the tongue every 5 (five) minutes as needed  for chest pain.   pramipexole 0.25 MG tablet Commonly known as: MIRAPEX Take 0.25 mg by mouth daily.   predniSONE 20 MG tablet Commonly known as: DELTASONE Take 2 tablets (40 mg total) by mouth daily with breakfast for 2 days. Start taking on: October 13, 2021   sodium bicarbonate 650 MG tablet Take 1,300 mg by mouth 2 (two) times daily.   traMADol 50 MG tablet Commonly known as: ULTRAM Take 50 mg by mouth daily as needed for pain.       Allergies  Allergen Reactions   Tenex [Guanfacine Hcl] Other (See Comments)    unknown   Calcitonin Other (See Comments)    unknown   Epoetin Alfa Other (See Comments)   Fosamax [Alendronate Sodium] Other (See Comments)    unknown   Gatifloxacin Other (See Comments)    unknown   Procrit [Epoetin (Alfa)] Other (See Comments)    unknown   Verapamil Other (See Comments)    unknown   Atorvastatin Other (See Comments)    2011: caused elevated liver function tests      Procedures/Studies: DG Chest Port 1 View  Result Date: 10/10/2021 CLINICAL DATA:  Shortness of breath. EXAM: PORTABLE CHEST 1 VIEW COMPARISON:  01/13/2021 FINDINGS: Left chest wall pacer device is identified with leads in the right atrial appendage and right  ventricle. Heart size is normal. Aortic atherosclerotic calcifications. No pleural effusion or edema identified. No airspace disease. The visualized osseous structures are unremarkable. IMPRESSION: No acute cardiopulmonary abnormalities. Electronically Signed   By: Kerby Moors M.D.   On: 10/10/2021 10:00      Subjective: As soon as I walked in her room, patient insisting on being discharged home.  She says that she is fine, her breathing is back to her baseline and she has all that she needs in her apartment to take care of herself including an RN in close proximity if she needed any medical assistance.  She denies dyspnea even with ambulation with nursing.  No chest pain or pain elsewhere.  Discharge Exam:  Vitals:   10/12/21 0759 10/12/21 1150 10/12/21 1152 10/12/21 1200  BP:  (!) 150/72    Pulse:  63 66   Resp:  18    Temp:  97.9 F (36.6 C)    TempSrc:  Oral    SpO2: 97% 94% 94% 90%  Weight:      Height:        General exam: Very elderly female, moderately built and frail sitting up comfortably in a reclining chair.  Subsequently seen ambulating comfortably with nursing supervision and assistance of a rolling walker. Respiratory system: Clear to auscultation.  No wheezing, rhonchi or crackles.  No increased work of breathing.  Able to speak in full sentences without dyspnea. Cardiovascular system: S1 & S2 heard, RRR. No JVD, murmurs, rubs, gallops or clicks. No pedal edema.  Telemetry personally reviewed: Sinus rhythm with first-degree AV block. Gastrointestinal system: Abdomen is nondistended, soft and nontender. No organomegaly or masses felt. Normal bowel sounds heard. Central nervous system: Alert and oriented. No focal neurological deficits. Extremities: Symmetric 5 x 5 power. Skin: No rashes, lesions or ulcers Psychiatry: Judgement and insight appear normal. Mood & affect appropriate.     The results of significant diagnostics from this hospitalization (including  imaging, microbiology, ancillary and laboratory) are listed below for reference.     Microbiology: Recent Results (from the past 240 hour(s))  Resp Panel by RT-PCR (Flu A&B, Covid) Nasopharyngeal Swab  Status: None   Collection Time: 10/10/21  9:52 AM   Specimen: Nasopharyngeal Swab; Nasopharyngeal(NP) swabs in vial transport medium  Result Value Ref Range Status   SARS Coronavirus 2 by RT PCR NEGATIVE NEGATIVE Final    Comment: (NOTE) SARS-CoV-2 target nucleic acids are NOT DETECTED.  The SARS-CoV-2 RNA is generally detectable in upper respiratory specimens during the acute phase of infection. The lowest concentration of SARS-CoV-2 viral copies this assay can detect is 138 copies/mL. A negative result does not preclude SARS-Cov-2 infection and should not be used as the sole basis for treatment or other patient management decisions. A negative result may occur with  improper specimen collection/handling, submission of specimen other than nasopharyngeal swab, presence of viral mutation(s) within the areas targeted by this assay, and inadequate number of viral copies(<138 copies/mL). A negative result must be combined with clinical observations, patient history, and epidemiological information. The expected result is Negative.  Fact Sheet for Patients:  EntrepreneurPulse.com.au  Fact Sheet for Healthcare Providers:  IncredibleEmployment.be  This test is no t yet approved or cleared by the [place] FDA and  has been authorized for detection and/or diagnosis of SARS-CoV-2 by FDA under an Emergency Use Authorization (EUA). This EUA will remain  in effect (meaning this test can be used) for the duration of the COVID-19 declaration under Section 564(b)(1) of the Act, 21 U.S.C.section 360bbb-3(b)(1), unless the authorization is terminated  or revoked sooner.       Influenza A by PCR NEGATIVE NEGATIVE Final   Influenza B by PCR NEGATIVE  NEGATIVE Final    Comment: (NOTE) The Xpert Xpress SARS-CoV-2/FLU/RSV plus assay is intended as an aid in the diagnosis of influenza from Nasopharyngeal swab specimens and should not be used as a sole basis for treatment. Nasal washings and aspirates are unacceptable for Xpert Xpress SARS-CoV-2/FLU/RSV testing.  Fact Sheet for Patients: EntrepreneurPulse.com.au  Fact Sheet for Healthcare Providers: IncredibleEmployment.be  This test is not yet approved or cleared by the [place] FDA and has been authorized for detection and/or diagnosis of SARS-CoV-2 by FDA under an Emergency Use Authorization (EUA). This EUA will remain in effect (meaning this test can be used) for the duration of the COVID-19 declaration under Section 564(b)(1) of the Act, 21 U.S.C. section 360bbb-3(b)(1), unless the authorization is terminated or revoked.  Performed at [facility], [place] [address] [place] [address]      Labs: CBC: Recent Labs  Lab 10/10/21 [id number] 10/11/21 0523 10/12/21 0459  WBC 4.8 4.9 7.7  NEUTROABS 2.5  --   --   HGB 10.6* 11.4* 11.4*  HCT 33.6* 35.3* 35.6*  MCV 101.2* 101.1* 100.8*  PLT 133* 147* 149*    Basic Metabolic Panel: Recent Labs  Lab 10/10/21 0951 10/10/21 1117 10/11/21 0523 10/12/21 0459  NA 137  --  135 133*  K 5.4* 5.4* 5.2* 4.6  CL 111  --  107 105  CO2 19*  --  20* 22  GLUCOSE 88  --  98 80  BUN 43*  --  45* 53*  CREATININE 1.87*  --  1.94* 1.87*  CALCIUM 8.1*  --  8.5* 8.3*    I discussed in detail with patient's daughter via phone, updated care and answered all questions.  Time coordinating discharge: 25 minutes  SIGNED:  Vernell Leep, MD, [place], [facility]. [facility]  To contact the attending provider between 7A-7P or the covering provider during after hours 7P-7A, please log into the web site [email or web address] and  access using universal [place] password for that web  site. If you do not have the password, please call the hospital operator.

## 2021-10-12 NOTE — Progress Notes (Signed)
Telemetry order discontinued. Telemetry box #50 removed and signed back in at nurses station.

## 2021-10-13 DIAGNOSIS — R5383 Other fatigue: Secondary | ICD-10-CM | POA: Diagnosis not present

## 2021-10-13 DIAGNOSIS — R531 Weakness: Secondary | ICD-10-CM | POA: Diagnosis not present

## 2021-10-13 DIAGNOSIS — J3489 Other specified disorders of nose and nasal sinuses: Secondary | ICD-10-CM | POA: Diagnosis not present

## 2021-10-13 DIAGNOSIS — S51812A Laceration without foreign body of left forearm, initial encounter: Secondary | ICD-10-CM | POA: Diagnosis not present

## 2021-10-13 DIAGNOSIS — R051 Acute cough: Secondary | ICD-10-CM | POA: Diagnosis not present

## 2021-10-14 DIAGNOSIS — J441 Chronic obstructive pulmonary disease with (acute) exacerbation: Secondary | ICD-10-CM | POA: Diagnosis not present

## 2021-10-15 DIAGNOSIS — J441 Chronic obstructive pulmonary disease with (acute) exacerbation: Secondary | ICD-10-CM | POA: Diagnosis not present

## 2021-10-15 DIAGNOSIS — R5383 Other fatigue: Secondary | ICD-10-CM | POA: Diagnosis not present

## 2021-10-16 DIAGNOSIS — R531 Weakness: Secondary | ICD-10-CM | POA: Diagnosis not present

## 2021-10-16 DIAGNOSIS — R051 Acute cough: Secondary | ICD-10-CM | POA: Diagnosis not present

## 2021-10-19 DIAGNOSIS — J449 Chronic obstructive pulmonary disease, unspecified: Secondary | ICD-10-CM | POA: Diagnosis not present

## 2021-10-19 DIAGNOSIS — Z7689 Persons encountering health services in other specified circumstances: Secondary | ICD-10-CM | POA: Diagnosis not present

## 2021-10-19 DIAGNOSIS — J441 Chronic obstructive pulmonary disease with (acute) exacerbation: Secondary | ICD-10-CM | POA: Diagnosis not present

## 2021-10-19 DIAGNOSIS — Z8679 Personal history of other diseases of the circulatory system: Secondary | ICD-10-CM | POA: Diagnosis not present

## 2021-10-19 DIAGNOSIS — F32A Depression, unspecified: Secondary | ICD-10-CM | POA: Diagnosis not present

## 2021-10-19 DIAGNOSIS — E039 Hypothyroidism, unspecified: Secondary | ICD-10-CM | POA: Diagnosis not present

## 2021-10-19 DIAGNOSIS — Z87891 Personal history of nicotine dependence: Secondary | ICD-10-CM | POA: Diagnosis not present

## 2021-10-19 DIAGNOSIS — S51812D Laceration without foreign body of left forearm, subsequent encounter: Secondary | ICD-10-CM | POA: Diagnosis not present

## 2021-10-20 DIAGNOSIS — J441 Chronic obstructive pulmonary disease with (acute) exacerbation: Secondary | ICD-10-CM | POA: Diagnosis not present

## 2021-10-20 DIAGNOSIS — R5383 Other fatigue: Secondary | ICD-10-CM | POA: Diagnosis not present

## 2021-10-20 DIAGNOSIS — J989 Respiratory disorder, unspecified: Secondary | ICD-10-CM | POA: Diagnosis not present

## 2021-10-20 DIAGNOSIS — R2689 Other abnormalities of gait and mobility: Secondary | ICD-10-CM | POA: Diagnosis not present

## 2021-10-20 DIAGNOSIS — J449 Chronic obstructive pulmonary disease, unspecified: Secondary | ICD-10-CM | POA: Diagnosis not present

## 2021-10-20 DIAGNOSIS — M6281 Muscle weakness (generalized): Secondary | ICD-10-CM | POA: Diagnosis not present

## 2021-10-20 DIAGNOSIS — R279 Unspecified lack of coordination: Secondary | ICD-10-CM | POA: Diagnosis not present

## 2021-10-21 DIAGNOSIS — E559 Vitamin D deficiency, unspecified: Secondary | ICD-10-CM | POA: Diagnosis not present

## 2021-10-21 DIAGNOSIS — E039 Hypothyroidism, unspecified: Secondary | ICD-10-CM | POA: Diagnosis not present

## 2021-10-21 DIAGNOSIS — N184 Chronic kidney disease, stage 4 (severe): Secondary | ICD-10-CM | POA: Diagnosis not present

## 2021-10-21 DIAGNOSIS — D519 Vitamin B12 deficiency anemia, unspecified: Secondary | ICD-10-CM | POA: Diagnosis not present

## 2021-10-21 DIAGNOSIS — J441 Chronic obstructive pulmonary disease with (acute) exacerbation: Secondary | ICD-10-CM | POA: Diagnosis not present

## 2021-10-22 DIAGNOSIS — M6281 Muscle weakness (generalized): Secondary | ICD-10-CM | POA: Diagnosis not present

## 2021-10-22 DIAGNOSIS — J989 Respiratory disorder, unspecified: Secondary | ICD-10-CM | POA: Diagnosis not present

## 2021-10-22 DIAGNOSIS — R2689 Other abnormalities of gait and mobility: Secondary | ICD-10-CM | POA: Diagnosis not present

## 2021-10-22 DIAGNOSIS — J441 Chronic obstructive pulmonary disease with (acute) exacerbation: Secondary | ICD-10-CM | POA: Diagnosis not present

## 2021-10-22 DIAGNOSIS — R279 Unspecified lack of coordination: Secondary | ICD-10-CM | POA: Diagnosis not present

## 2021-10-26 DIAGNOSIS — E039 Hypothyroidism, unspecified: Secondary | ICD-10-CM | POA: Diagnosis not present

## 2021-10-26 DIAGNOSIS — R600 Localized edema: Secondary | ICD-10-CM | POA: Diagnosis not present

## 2021-10-26 DIAGNOSIS — R279 Unspecified lack of coordination: Secondary | ICD-10-CM | POA: Diagnosis not present

## 2021-10-26 DIAGNOSIS — M6281 Muscle weakness (generalized): Secondary | ICD-10-CM | POA: Diagnosis not present

## 2021-10-26 DIAGNOSIS — J989 Respiratory disorder, unspecified: Secondary | ICD-10-CM | POA: Diagnosis not present

## 2021-10-26 DIAGNOSIS — N184 Chronic kidney disease, stage 4 (severe): Secondary | ICD-10-CM | POA: Diagnosis not present

## 2021-10-26 DIAGNOSIS — E870 Hyperosmolality and hypernatremia: Secondary | ICD-10-CM | POA: Diagnosis not present

## 2021-10-26 DIAGNOSIS — D649 Anemia, unspecified: Secondary | ICD-10-CM | POA: Diagnosis not present

## 2021-10-26 DIAGNOSIS — R2689 Other abnormalities of gait and mobility: Secondary | ICD-10-CM | POA: Diagnosis not present

## 2021-10-26 DIAGNOSIS — E559 Vitamin D deficiency, unspecified: Secondary | ICD-10-CM | POA: Diagnosis not present

## 2021-10-26 DIAGNOSIS — J441 Chronic obstructive pulmonary disease with (acute) exacerbation: Secondary | ICD-10-CM | POA: Diagnosis not present

## 2021-10-27 DIAGNOSIS — R058 Other specified cough: Secondary | ICD-10-CM | POA: Diagnosis not present

## 2021-10-27 DIAGNOSIS — R279 Unspecified lack of coordination: Secondary | ICD-10-CM | POA: Diagnosis not present

## 2021-10-27 DIAGNOSIS — J449 Chronic obstructive pulmonary disease, unspecified: Secondary | ICD-10-CM | POA: Diagnosis not present

## 2021-10-27 DIAGNOSIS — J441 Chronic obstructive pulmonary disease with (acute) exacerbation: Secondary | ICD-10-CM | POA: Diagnosis not present

## 2021-10-27 DIAGNOSIS — M6281 Muscle weakness (generalized): Secondary | ICD-10-CM | POA: Diagnosis not present

## 2021-10-27 DIAGNOSIS — R2689 Other abnormalities of gait and mobility: Secondary | ICD-10-CM | POA: Diagnosis not present

## 2021-10-27 DIAGNOSIS — R062 Wheezing: Secondary | ICD-10-CM | POA: Diagnosis not present

## 2021-10-27 DIAGNOSIS — J989 Respiratory disorder, unspecified: Secondary | ICD-10-CM | POA: Diagnosis not present

## 2021-10-27 DIAGNOSIS — R053 Chronic cough: Secondary | ICD-10-CM | POA: Diagnosis not present

## 2021-10-27 DIAGNOSIS — R0789 Other chest pain: Secondary | ICD-10-CM | POA: Diagnosis not present

## 2021-10-29 DIAGNOSIS — K922 Gastrointestinal hemorrhage, unspecified: Secondary | ICD-10-CM | POA: Diagnosis not present

## 2021-10-29 DIAGNOSIS — R062 Wheezing: Secondary | ICD-10-CM | POA: Diagnosis not present

## 2021-10-29 DIAGNOSIS — R2681 Unsteadiness on feet: Secondary | ICD-10-CM | POA: Diagnosis not present

## 2021-10-29 DIAGNOSIS — M549 Dorsalgia, unspecified: Secondary | ICD-10-CM | POA: Diagnosis not present

## 2021-10-29 DIAGNOSIS — I5030 Unspecified diastolic (congestive) heart failure: Secondary | ICD-10-CM | POA: Diagnosis not present

## 2021-10-29 DIAGNOSIS — M5134 Other intervertebral disc degeneration, thoracic region: Secondary | ICD-10-CM | POA: Diagnosis not present

## 2021-10-29 DIAGNOSIS — R053 Chronic cough: Secondary | ICD-10-CM | POA: Diagnosis not present

## 2021-10-29 DIAGNOSIS — R5383 Other fatigue: Secondary | ICD-10-CM | POA: Diagnosis not present

## 2021-10-29 DIAGNOSIS — R0602 Shortness of breath: Secondary | ICD-10-CM | POA: Diagnosis not present

## 2021-10-29 DIAGNOSIS — D61818 Other pancytopenia: Secondary | ICD-10-CM | POA: Diagnosis not present

## 2021-10-29 DIAGNOSIS — Z8679 Personal history of other diseases of the circulatory system: Secondary | ICD-10-CM | POA: Diagnosis not present

## 2021-10-29 DIAGNOSIS — N184 Chronic kidney disease, stage 4 (severe): Secondary | ICD-10-CM | POA: Diagnosis not present

## 2021-10-29 DIAGNOSIS — I214 Non-ST elevation (NSTEMI) myocardial infarction: Secondary | ICD-10-CM | POA: Diagnosis not present

## 2021-10-29 DIAGNOSIS — J449 Chronic obstructive pulmonary disease, unspecified: Secondary | ICD-10-CM | POA: Diagnosis not present

## 2021-10-29 DIAGNOSIS — S2231XA Fracture of one rib, right side, initial encounter for closed fracture: Secondary | ICD-10-CM | POA: Diagnosis not present

## 2021-10-29 DIAGNOSIS — I10 Essential (primary) hypertension: Secondary | ICD-10-CM | POA: Diagnosis not present

## 2021-10-29 DIAGNOSIS — D649 Anemia, unspecified: Secondary | ICD-10-CM | POA: Diagnosis not present

## 2021-10-29 DIAGNOSIS — R059 Cough, unspecified: Secondary | ICD-10-CM | POA: Diagnosis not present

## 2021-10-29 DIAGNOSIS — I509 Heart failure, unspecified: Secondary | ICD-10-CM | POA: Diagnosis not present

## 2021-10-29 DIAGNOSIS — R109 Unspecified abdominal pain: Secondary | ICD-10-CM | POA: Diagnosis not present

## 2021-10-29 DIAGNOSIS — R0781 Pleurodynia: Secondary | ICD-10-CM | POA: Diagnosis not present

## 2021-10-29 DIAGNOSIS — J441 Chronic obstructive pulmonary disease with (acute) exacerbation: Secondary | ICD-10-CM | POA: Diagnosis not present

## 2021-10-29 DIAGNOSIS — M5136 Other intervertebral disc degeneration, lumbar region: Secondary | ICD-10-CM | POA: Diagnosis not present

## 2021-10-29 DIAGNOSIS — R051 Acute cough: Secondary | ICD-10-CM | POA: Diagnosis not present

## 2021-10-29 DIAGNOSIS — Z7689 Persons encountering health services in other specified circumstances: Secondary | ICD-10-CM | POA: Diagnosis not present

## 2021-10-29 DIAGNOSIS — K219 Gastro-esophageal reflux disease without esophagitis: Secondary | ICD-10-CM | POA: Diagnosis not present

## 2021-10-29 DIAGNOSIS — R2689 Other abnormalities of gait and mobility: Secondary | ICD-10-CM | POA: Diagnosis not present

## 2021-10-29 DIAGNOSIS — K5900 Constipation, unspecified: Secondary | ICD-10-CM | POA: Diagnosis not present

## 2021-10-29 DIAGNOSIS — R21 Rash and other nonspecific skin eruption: Secondary | ICD-10-CM | POA: Diagnosis not present

## 2021-10-29 DIAGNOSIS — I5189 Other ill-defined heart diseases: Secondary | ICD-10-CM | POA: Diagnosis not present

## 2021-10-29 DIAGNOSIS — N179 Acute kidney failure, unspecified: Secondary | ICD-10-CM | POA: Diagnosis not present

## 2021-10-29 DIAGNOSIS — D519 Vitamin B12 deficiency anemia, unspecified: Secondary | ICD-10-CM | POA: Diagnosis not present

## 2021-10-29 DIAGNOSIS — R1030 Lower abdominal pain, unspecified: Secondary | ICD-10-CM | POA: Diagnosis not present

## 2021-10-29 DIAGNOSIS — E039 Hypothyroidism, unspecified: Secondary | ICD-10-CM | POA: Diagnosis not present

## 2021-10-29 DIAGNOSIS — E860 Dehydration: Secondary | ICD-10-CM | POA: Diagnosis not present

## 2021-10-29 DIAGNOSIS — R0600 Dyspnea, unspecified: Secondary | ICD-10-CM | POA: Diagnosis not present

## 2021-10-29 DIAGNOSIS — E440 Moderate protein-calorie malnutrition: Secondary | ICD-10-CM | POA: Diagnosis not present

## 2021-10-29 DIAGNOSIS — U071 COVID-19: Secondary | ICD-10-CM | POA: Diagnosis not present

## 2021-10-29 DIAGNOSIS — R509 Fever, unspecified: Secondary | ICD-10-CM | POA: Diagnosis not present

## 2021-10-29 DIAGNOSIS — R52 Pain, unspecified: Secondary | ICD-10-CM | POA: Diagnosis not present

## 2021-10-29 DIAGNOSIS — M6281 Muscle weakness (generalized): Secondary | ICD-10-CM | POA: Diagnosis not present

## 2021-10-29 DIAGNOSIS — G8929 Other chronic pain: Secondary | ICD-10-CM | POA: Diagnosis not present

## 2021-10-29 DIAGNOSIS — E785 Hyperlipidemia, unspecified: Secondary | ICD-10-CM | POA: Diagnosis not present

## 2021-10-29 DIAGNOSIS — L89151 Pressure ulcer of sacral region, stage 1: Secondary | ICD-10-CM | POA: Diagnosis not present

## 2021-10-30 DIAGNOSIS — D61818 Other pancytopenia: Secondary | ICD-10-CM | POA: Diagnosis not present

## 2021-10-30 DIAGNOSIS — D649 Anemia, unspecified: Secondary | ICD-10-CM | POA: Diagnosis not present

## 2021-10-30 DIAGNOSIS — I509 Heart failure, unspecified: Secondary | ICD-10-CM | POA: Diagnosis not present

## 2021-10-30 DIAGNOSIS — N184 Chronic kidney disease, stage 4 (severe): Secondary | ICD-10-CM | POA: Diagnosis not present

## 2021-10-30 DIAGNOSIS — R0600 Dyspnea, unspecified: Secondary | ICD-10-CM | POA: Diagnosis not present

## 2021-10-30 DIAGNOSIS — M549 Dorsalgia, unspecified: Secondary | ICD-10-CM | POA: Diagnosis not present

## 2021-10-30 DIAGNOSIS — Z7689 Persons encountering health services in other specified circumstances: Secondary | ICD-10-CM | POA: Diagnosis not present

## 2021-11-02 DIAGNOSIS — R0781 Pleurodynia: Secondary | ICD-10-CM | POA: Diagnosis not present

## 2021-11-02 DIAGNOSIS — K5900 Constipation, unspecified: Secondary | ICD-10-CM | POA: Diagnosis not present

## 2021-11-02 DIAGNOSIS — R109 Unspecified abdominal pain: Secondary | ICD-10-CM | POA: Diagnosis not present

## 2021-11-02 DIAGNOSIS — M549 Dorsalgia, unspecified: Secondary | ICD-10-CM | POA: Diagnosis not present

## 2021-11-03 DIAGNOSIS — R52 Pain, unspecified: Secondary | ICD-10-CM | POA: Diagnosis not present

## 2021-11-03 DIAGNOSIS — S2231XA Fracture of one rib, right side, initial encounter for closed fracture: Secondary | ICD-10-CM | POA: Diagnosis not present

## 2021-11-03 DIAGNOSIS — M6281 Muscle weakness (generalized): Secondary | ICD-10-CM | POA: Diagnosis not present

## 2021-11-03 DIAGNOSIS — Z7689 Persons encountering health services in other specified circumstances: Secondary | ICD-10-CM | POA: Diagnosis not present

## 2021-11-05 DIAGNOSIS — J441 Chronic obstructive pulmonary disease with (acute) exacerbation: Secondary | ICD-10-CM | POA: Diagnosis not present

## 2021-11-05 DIAGNOSIS — R5383 Other fatigue: Secondary | ICD-10-CM | POA: Diagnosis not present

## 2021-11-06 DIAGNOSIS — M549 Dorsalgia, unspecified: Secondary | ICD-10-CM | POA: Diagnosis not present

## 2021-11-06 DIAGNOSIS — R059 Cough, unspecified: Secondary | ICD-10-CM | POA: Diagnosis not present

## 2021-11-06 DIAGNOSIS — J449 Chronic obstructive pulmonary disease, unspecified: Secondary | ICD-10-CM | POA: Diagnosis not present

## 2021-11-06 DIAGNOSIS — R2689 Other abnormalities of gait and mobility: Secondary | ICD-10-CM | POA: Diagnosis not present

## 2021-11-06 DIAGNOSIS — M6281 Muscle weakness (generalized): Secondary | ICD-10-CM | POA: Diagnosis not present

## 2021-11-09 DIAGNOSIS — L89151 Pressure ulcer of sacral region, stage 1: Secondary | ICD-10-CM | POA: Diagnosis not present

## 2021-11-09 DIAGNOSIS — M549 Dorsalgia, unspecified: Secondary | ICD-10-CM | POA: Diagnosis not present

## 2021-11-09 DIAGNOSIS — Z7689 Persons encountering health services in other specified circumstances: Secondary | ICD-10-CM | POA: Diagnosis not present

## 2021-11-09 DIAGNOSIS — R52 Pain, unspecified: Secondary | ICD-10-CM | POA: Diagnosis not present

## 2021-11-10 DIAGNOSIS — N184 Chronic kidney disease, stage 4 (severe): Secondary | ICD-10-CM | POA: Diagnosis not present

## 2021-11-10 DIAGNOSIS — E860 Dehydration: Secondary | ICD-10-CM | POA: Diagnosis not present

## 2021-11-10 DIAGNOSIS — Z8679 Personal history of other diseases of the circulatory system: Secondary | ICD-10-CM | POA: Diagnosis not present

## 2021-11-10 DIAGNOSIS — N179 Acute kidney failure, unspecified: Secondary | ICD-10-CM | POA: Diagnosis not present

## 2021-11-10 DIAGNOSIS — Z7689 Persons encountering health services in other specified circumstances: Secondary | ICD-10-CM | POA: Diagnosis not present

## 2021-11-11 DIAGNOSIS — E860 Dehydration: Secondary | ICD-10-CM | POA: Diagnosis not present

## 2021-11-11 DIAGNOSIS — Z8679 Personal history of other diseases of the circulatory system: Secondary | ICD-10-CM | POA: Diagnosis not present

## 2021-11-11 DIAGNOSIS — J449 Chronic obstructive pulmonary disease, unspecified: Secondary | ICD-10-CM | POA: Diagnosis not present

## 2021-11-11 DIAGNOSIS — R053 Chronic cough: Secondary | ICD-10-CM | POA: Diagnosis not present

## 2021-11-12 DIAGNOSIS — J441 Chronic obstructive pulmonary disease with (acute) exacerbation: Secondary | ICD-10-CM | POA: Diagnosis not present

## 2021-11-16 DIAGNOSIS — J441 Chronic obstructive pulmonary disease with (acute) exacerbation: Secondary | ICD-10-CM | POA: Diagnosis not present

## 2021-11-16 DIAGNOSIS — R944 Abnormal results of kidney function studies: Secondary | ICD-10-CM | POA: Diagnosis not present

## 2021-11-16 DIAGNOSIS — N184 Chronic kidney disease, stage 4 (severe): Secondary | ICD-10-CM | POA: Diagnosis not present

## 2021-11-16 DIAGNOSIS — Z7689 Persons encountering health services in other specified circumstances: Secondary | ICD-10-CM | POA: Diagnosis not present

## 2021-11-17 DIAGNOSIS — J449 Chronic obstructive pulmonary disease, unspecified: Secondary | ICD-10-CM | POA: Diagnosis not present

## 2021-11-17 DIAGNOSIS — Z20822 Contact with and (suspected) exposure to covid-19: Secondary | ICD-10-CM | POA: Diagnosis not present

## 2021-11-17 DIAGNOSIS — R2689 Other abnormalities of gait and mobility: Secondary | ICD-10-CM | POA: Diagnosis not present

## 2021-11-17 DIAGNOSIS — M6281 Muscle weakness (generalized): Secondary | ICD-10-CM | POA: Diagnosis not present

## 2021-11-18 DIAGNOSIS — J441 Chronic obstructive pulmonary disease with (acute) exacerbation: Secondary | ICD-10-CM | POA: Diagnosis not present

## 2021-11-19 DIAGNOSIS — R2689 Other abnormalities of gait and mobility: Secondary | ICD-10-CM | POA: Diagnosis not present

## 2021-11-19 DIAGNOSIS — R279 Unspecified lack of coordination: Secondary | ICD-10-CM | POA: Diagnosis not present

## 2021-11-19 DIAGNOSIS — Z7689 Persons encountering health services in other specified circumstances: Secondary | ICD-10-CM | POA: Diagnosis not present

## 2021-11-19 DIAGNOSIS — R509 Fever, unspecified: Secondary | ICD-10-CM | POA: Diagnosis not present

## 2021-11-19 DIAGNOSIS — J9621 Acute and chronic respiratory failure with hypoxia: Secondary | ICD-10-CM | POA: Diagnosis not present

## 2021-11-19 DIAGNOSIS — R0989 Other specified symptoms and signs involving the circulatory and respiratory systems: Secondary | ICD-10-CM | POA: Diagnosis not present

## 2021-11-19 DIAGNOSIS — R112 Nausea with vomiting, unspecified: Secondary | ICD-10-CM | POA: Diagnosis not present

## 2021-11-19 DIAGNOSIS — J441 Chronic obstructive pulmonary disease with (acute) exacerbation: Secondary | ICD-10-CM | POA: Diagnosis not present

## 2021-11-19 DIAGNOSIS — R0602 Shortness of breath: Secondary | ICD-10-CM | POA: Diagnosis not present

## 2021-11-20 DIAGNOSIS — R140 Abdominal distension (gaseous): Secondary | ICD-10-CM | POA: Diagnosis not present

## 2021-11-20 DIAGNOSIS — R110 Nausea: Secondary | ICD-10-CM | POA: Diagnosis not present

## 2021-11-23 DIAGNOSIS — R12 Heartburn: Secondary | ICD-10-CM | POA: Diagnosis not present

## 2021-11-23 DIAGNOSIS — K219 Gastro-esophageal reflux disease without esophagitis: Secondary | ICD-10-CM | POA: Diagnosis not present

## 2021-11-25 DIAGNOSIS — J449 Chronic obstructive pulmonary disease, unspecified: Secondary | ICD-10-CM | POA: Diagnosis not present

## 2021-11-25 DIAGNOSIS — R0989 Other specified symptoms and signs involving the circulatory and respiratory systems: Secondary | ICD-10-CM | POA: Diagnosis not present

## 2021-11-25 DIAGNOSIS — R627 Adult failure to thrive: Secondary | ICD-10-CM | POA: Diagnosis not present

## 2021-11-26 ENCOUNTER — Ambulatory Visit (INDEPENDENT_AMBULATORY_CARE_PROVIDER_SITE_OTHER)

## 2021-11-26 DIAGNOSIS — I442 Atrioventricular block, complete: Secondary | ICD-10-CM

## 2021-11-27 DIAGNOSIS — Z515 Encounter for palliative care: Secondary | ICD-10-CM | POA: Diagnosis not present

## 2021-11-27 DIAGNOSIS — L89150 Pressure ulcer of sacral region, unstageable: Secondary | ICD-10-CM | POA: Diagnosis not present

## 2021-11-27 DIAGNOSIS — R52 Pain, unspecified: Secondary | ICD-10-CM | POA: Diagnosis not present

## 2021-11-27 DIAGNOSIS — Z79899 Other long term (current) drug therapy: Secondary | ICD-10-CM | POA: Diagnosis not present

## 2021-11-27 DIAGNOSIS — J441 Chronic obstructive pulmonary disease with (acute) exacerbation: Secondary | ICD-10-CM | POA: Diagnosis not present

## 2021-11-27 DIAGNOSIS — R053 Chronic cough: Secondary | ICD-10-CM | POA: Diagnosis not present

## 2021-11-30 LAB — CUP PACEART REMOTE DEVICE CHECK
Battery Remaining Longevity: 122 mo
Battery Remaining Percentage: 95 %
Battery Voltage: 3.02 V
Brady Statistic AP VP Percent: 1 %
Brady Statistic AP VS Percent: 1 %
Brady Statistic AS VP Percent: 1 %
Brady Statistic AS VS Percent: 99 %
Brady Statistic RA Percent Paced: 1 %
Brady Statistic RV Percent Paced: 1 %
Date Time Interrogation Session: 20221209161453
Implantable Lead Implant Date: 20211208
Implantable Lead Implant Date: 20211208
Implantable Lead Location: 753859
Implantable Lead Location: 753860
Implantable Pulse Generator Implant Date: 20211208
Lead Channel Impedance Value: 350 Ohm
Lead Channel Impedance Value: 460 Ohm
Lead Channel Pacing Threshold Amplitude: 0.75 V
Lead Channel Pacing Threshold Amplitude: 0.75 V
Lead Channel Pacing Threshold Pulse Width: 0.5 ms
Lead Channel Pacing Threshold Pulse Width: 0.5 ms
Lead Channel Sensing Intrinsic Amplitude: 12 mV
Lead Channel Sensing Intrinsic Amplitude: 4.1 mV
Lead Channel Setting Pacing Amplitude: 1 V
Lead Channel Setting Pacing Amplitude: 1.75 V
Lead Channel Setting Pacing Pulse Width: 0.5 ms
Lead Channel Setting Sensing Sensitivity: 2 mV
Pulse Gen Model: 2272
Pulse Gen Serial Number: 3882125

## 2021-12-01 DIAGNOSIS — Z515 Encounter for palliative care: Secondary | ICD-10-CM | POA: Diagnosis not present

## 2021-12-01 DIAGNOSIS — J441 Chronic obstructive pulmonary disease with (acute) exacerbation: Secondary | ICD-10-CM | POA: Diagnosis not present

## 2021-12-01 DIAGNOSIS — R52 Pain, unspecified: Secondary | ICD-10-CM | POA: Diagnosis not present

## 2021-12-01 DIAGNOSIS — L89150 Pressure ulcer of sacral region, unstageable: Secondary | ICD-10-CM | POA: Diagnosis not present

## 2021-12-02 DIAGNOSIS — R52 Pain, unspecified: Secondary | ICD-10-CM | POA: Diagnosis not present

## 2021-12-02 DIAGNOSIS — Z515 Encounter for palliative care: Secondary | ICD-10-CM | POA: Diagnosis not present

## 2021-12-02 DIAGNOSIS — L89150 Pressure ulcer of sacral region, unstageable: Secondary | ICD-10-CM | POA: Diagnosis not present

## 2021-12-02 DIAGNOSIS — J441 Chronic obstructive pulmonary disease with (acute) exacerbation: Secondary | ICD-10-CM | POA: Diagnosis not present

## 2021-12-04 NOTE — Progress Notes (Signed)
Remote pacemaker transmission.   

## 2021-12-17 ENCOUNTER — Telehealth: Payer: Self-pay

## 2021-12-17 NOTE — Telephone Encounter (Signed)
Patient has passed away and patient daughter has sent back home monitor to the company. I have released patient from [facility]

## 2021-12-20 DEATH — deceased

## 2021-12-22 ENCOUNTER — Encounter: Payer: Medicare Other | Admitting: Cardiology
